# Patient Record
Sex: Female | Born: 1975 | Race: White | Hispanic: No | Smoking: Current some day smoker
Health system: Southern US, Community
[De-identification: ages and names within clinical notes are randomized; demographics above are authoritative.]

## PROBLEM LIST (undated history)

## (undated) DIAGNOSIS — K859 Acute pancreatitis without necrosis or infection, unspecified: Secondary | ICD-10-CM

## (undated) DIAGNOSIS — F32A Depression, unspecified: Secondary | ICD-10-CM

## (undated) DIAGNOSIS — G40909 Epilepsy, unspecified, not intractable, without status epilepticus: Secondary | ICD-10-CM

## (undated) DIAGNOSIS — F319 Bipolar disorder, unspecified: Secondary | ICD-10-CM

## (undated) DIAGNOSIS — F329 Major depressive disorder, single episode, unspecified: Secondary | ICD-10-CM

## (undated) HISTORY — PX: ABDOMINAL HYSTERECTOMY: SHX81

## (undated) HISTORY — PX: PANCREATICODUODENECTOMY: SUR1000

## (undated) HISTORY — PX: DENTAL SURGERY: SHX609

## (undated) HISTORY — PX: CHOLECYSTECTOMY: SHX55

---

## 2009-01-16 ENCOUNTER — Emergency Department (HOSPITAL_COMMUNITY): Admission: EM | Admit: 2009-01-16 | Discharge: 2009-01-16 | Payer: Self-pay | Admitting: Emergency Medicine

## 2009-01-30 ENCOUNTER — Emergency Department (HOSPITAL_COMMUNITY): Admission: EM | Admit: 2009-01-30 | Discharge: 2009-01-30 | Payer: Self-pay | Admitting: Emergency Medicine

## 2010-06-05 ENCOUNTER — Emergency Department (HOSPITAL_COMMUNITY)
Admission: EM | Admit: 2010-06-05 | Discharge: 2010-06-05 | Disposition: A | Discharge status: Home / Self Care | Payer: Self-pay | Attending: Emergency Medicine | Admitting: Emergency Medicine

## 2010-06-05 ENCOUNTER — Emergency Department (HOSPITAL_COMMUNITY): Payer: Self-pay

## 2010-06-05 DIAGNOSIS — R319 Hematuria, unspecified: Secondary | ICD-10-CM | POA: Insufficient documentation

## 2010-06-05 DIAGNOSIS — Z9889 Other specified postprocedural states: Secondary | ICD-10-CM | POA: Insufficient documentation

## 2010-06-05 DIAGNOSIS — Z9089 Acquired absence of other organs: Secondary | ICD-10-CM | POA: Insufficient documentation

## 2010-06-05 DIAGNOSIS — F411 Generalized anxiety disorder: Secondary | ICD-10-CM | POA: Insufficient documentation

## 2010-06-05 DIAGNOSIS — R109 Unspecified abdominal pain: Secondary | ICD-10-CM | POA: Insufficient documentation

## 2010-06-05 LAB — URINALYSIS, ROUTINE W REFLEX MICROSCOPIC
Bilirubin Urine: NEGATIVE
Ketones, ur: NEGATIVE mg/dL
Protein, ur: NEGATIVE mg/dL
Urobilinogen, UA: 0.2 mg/dL (ref 0.0–1.0)

## 2010-06-07 LAB — URINE CULTURE
Colony Count: NO GROWTH
Culture  Setup Time: 201203210119
Culture: NO GROWTH

## 2010-06-20 LAB — URINALYSIS, ROUTINE W REFLEX MICROSCOPIC
Ketones, ur: 15 mg/dL — AB
Ketones, ur: 15 mg/dL — AB
Leukocytes, UA: NEGATIVE
Nitrite: POSITIVE — AB
Protein, ur: NEGATIVE mg/dL
Specific Gravity, Urine: 1.028 (ref 1.005–1.030)
Urobilinogen, UA: 1 mg/dL (ref 0.0–1.0)
Urobilinogen, UA: 1 mg/dL (ref 0.0–1.0)
pH: 5.5 (ref 5.0–8.0)

## 2010-06-20 LAB — CBC
Hemoglobin: 13.9 g/dL (ref 12.0–15.0)
MCHC: 34.4 g/dL (ref 30.0–36.0)
MCV: 85.9 fL (ref 78.0–100.0)
RBC: 4.7 MIL/uL (ref 3.87–5.11)
RDW: 14.5 % (ref 11.5–15.5)

## 2010-06-20 LAB — URINE MICROSCOPIC-ADD ON

## 2010-06-20 LAB — COMPREHENSIVE METABOLIC PANEL
ALT: 26 U/L (ref 0–35)
BUN: 5 mg/dL — ABNORMAL LOW (ref 6–23)
CO2: 27 mEq/L (ref 19–32)
Calcium: 9 mg/dL (ref 8.4–10.5)
Creatinine, Ser: 0.61 mg/dL (ref 0.4–1.2)
GFR calc non Af Amer: >60 mL/min (ref >60)
Glucose, Bld: 120 mg/dL — ABNORMAL HIGH (ref 70–99)
Sodium: 137 mEq/L (ref 135–145)
Total Protein: 6.4 g/dL (ref 6.0–8.3)

## 2010-06-20 LAB — DIFFERENTIAL
Basophils Absolute: 0 10*3/uL (ref 0.0–0.1)
Basophils Relative: 0 % (ref 0–1)
Eosinophils Absolute: 0 10*3/uL (ref 0.0–0.7)
Lymphocytes Relative: 13 % (ref 12–46)
Lymphs Abs: 1.8 10*3/uL (ref 0.7–4.0)
Monocytes Relative: 5 % (ref 3–12)
Neutro Abs: 11.3 10*3/uL — ABNORMAL HIGH (ref 1.7–7.7)
Neutrophils Relative %: 82 % — ABNORMAL HIGH (ref 43–77)

## 2010-06-20 LAB — HIV ANTIBODY (ROUTINE TESTING W REFLEX): HIV: NONREACTIVE

## 2010-06-20 LAB — RPR: RPR Ser Ql: NONREACTIVE

## 2010-11-18 ENCOUNTER — Emergency Department (HOSPITAL_COMMUNITY)
Admission: EM | Admit: 2010-11-18 | Discharge: 2010-11-19 | Disposition: A | Discharge status: Other Acute Inpatient Hospital | Payer: Self-pay | Attending: Emergency Medicine | Admitting: Emergency Medicine

## 2010-11-18 DIAGNOSIS — F411 Generalized anxiety disorder: Secondary | ICD-10-CM | POA: Insufficient documentation

## 2010-11-18 DIAGNOSIS — IMO0002 Reserved for concepts with insufficient information to code with codable children: Secondary | ICD-10-CM | POA: Insufficient documentation

## 2010-11-18 DIAGNOSIS — R45851 Suicidal ideations: Secondary | ICD-10-CM | POA: Insufficient documentation

## 2010-11-18 DIAGNOSIS — S0083XA Contusion of other part of head, initial encounter: Secondary | ICD-10-CM | POA: Insufficient documentation

## 2010-11-18 DIAGNOSIS — S0003XA Contusion of scalp, initial encounter: Secondary | ICD-10-CM | POA: Insufficient documentation

## 2010-11-18 LAB — DIFFERENTIAL
Eosinophils Relative: 3 % (ref 0–5)
Lymphocytes Relative: 43 % (ref 12–46)
Lymphs Abs: 3.2 10*3/uL (ref 0.7–4.0)
Neutro Abs: 3.5 10*3/uL (ref 1.7–7.7)

## 2010-11-18 LAB — COMPREHENSIVE METABOLIC PANEL
ALT: 16 U/L (ref 0–35)
CO2: 25 mEq/L (ref 19–32)
Calcium: 8.3 mg/dL — ABNORMAL LOW (ref 8.4–10.5)
Creatinine, Ser: 0.71 mg/dL (ref 0.50–1.10)
GFR calc Af Amer: >60 mL/min (ref >60)
GFR calc non Af Amer: >60 mL/min (ref >60)
Glucose, Bld: 91 mg/dL (ref 70–99)
Sodium: 141 mEq/L (ref 135–145)
Total Bilirubin: 0.2 mg/dL — ABNORMAL LOW (ref 0.3–1.2)

## 2010-11-18 LAB — CBC
HCT: 41.6 % (ref 36.0–46.0)
Hemoglobin: 14.1 g/dL (ref 12.0–15.0)
MCH: 29.9 pg (ref 26.0–34.0)
MCV: 88.3 fL (ref 78.0–100.0)
RBC: 4.71 MIL/uL (ref 3.87–5.11)
RDW: 13.9 % (ref 11.5–15.5)
WBC: 7.5 10*3/uL (ref 4.0–10.5)

## 2010-11-18 LAB — RAPID URINE DRUG SCREEN, HOSP PERFORMED
Barbiturates: NOT DETECTED
Cocaine: NOT DETECTED
Opiates: NOT DETECTED

## 2010-11-19 ENCOUNTER — Emergency Department (HOSPITAL_COMMUNITY): Payer: Self-pay

## 2010-11-19 ENCOUNTER — Inpatient Hospital Stay (HOSPITAL_COMMUNITY)
Admission: AD | Admit: 2010-11-19 | Discharge: 2010-11-21 | DRG: 881 | Disposition: A | Discharge status: Home / Self Care | Payer: PRIVATE HEALTH INSURANCE | Attending: Psychiatry | Admitting: Psychiatry

## 2010-11-19 DIAGNOSIS — R45851 Suicidal ideations: Secondary | ICD-10-CM

## 2010-11-19 DIAGNOSIS — F1010 Alcohol abuse, uncomplicated: Secondary | ICD-10-CM

## 2010-11-19 DIAGNOSIS — Z6379 Other stressful life events affecting family and household: Secondary | ICD-10-CM

## 2010-11-19 DIAGNOSIS — Z818 Family history of other mental and behavioral disorders: Secondary | ICD-10-CM

## 2010-11-19 DIAGNOSIS — F329 Major depressive disorder, single episode, unspecified: Principal | ICD-10-CM

## 2010-11-19 DIAGNOSIS — F3289 Other specified depressive episodes: Principal | ICD-10-CM

## 2010-11-20 DIAGNOSIS — F319 Bipolar disorder, unspecified: Secondary | ICD-10-CM
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
## 2010-12-17 ENCOUNTER — Emergency Department (HOSPITAL_COMMUNITY): Payer: Self-pay

## 2010-12-17 ENCOUNTER — Emergency Department (HOSPITAL_COMMUNITY)
Admission: EM | Admit: 2010-12-17 | Discharge: 2010-12-17 | Disposition: A | Discharge status: Home / Self Care | Payer: Self-pay | Attending: Emergency Medicine | Admitting: Emergency Medicine

## 2010-12-17 DIAGNOSIS — M549 Dorsalgia, unspecified: Secondary | ICD-10-CM | POA: Insufficient documentation

## 2010-12-17 DIAGNOSIS — R10816 Epigastric abdominal tenderness: Secondary | ICD-10-CM | POA: Insufficient documentation

## 2010-12-17 DIAGNOSIS — K859 Acute pancreatitis without necrosis or infection, unspecified: Secondary | ICD-10-CM | POA: Insufficient documentation

## 2010-12-17 DIAGNOSIS — R1013 Epigastric pain: Secondary | ICD-10-CM | POA: Insufficient documentation

## 2010-12-17 LAB — COMPREHENSIVE METABOLIC PANEL
ALT: 14 U/L (ref 0–35)
AST: 14 U/L (ref 0–37)
CO2: 29 mEq/L (ref 19–32)
Chloride: 98 mEq/L (ref 96–112)
Creatinine, Ser: 0.61 mg/dL (ref 0.50–1.10)
GFR calc Af Amer: >90 mL/min (ref >90)
GFR calc non Af Amer: >90 mL/min (ref >90)
Glucose, Bld: 98 mg/dL (ref 70–99)
Total Bilirubin: 0.4 mg/dL (ref 0.3–1.2)

## 2010-12-17 LAB — URINALYSIS, ROUTINE W REFLEX MICROSCOPIC
Glucose, UA: NEGATIVE mg/dL
Hgb urine dipstick: NEGATIVE
Ketones, ur: NEGATIVE mg/dL
Protein, ur: NEGATIVE mg/dL

## 2010-12-17 LAB — DIFFERENTIAL
Eosinophils Absolute: 0.2 10*3/uL (ref 0.0–0.7)
Lymphocytes Relative: 23 % (ref 12–46)
Lymphs Abs: 2.1 10*3/uL (ref 0.7–4.0)
Monocytes Relative: 7 % (ref 3–12)
Neutro Abs: 6.2 10*3/uL (ref 1.7–7.7)
Neutrophils Relative %: 68 % (ref 43–77)

## 2010-12-17 LAB — CBC
Hemoglobin: 14.8 g/dL (ref 12.0–15.0)
MCV: 89.6 fL (ref 78.0–100.0)
Platelets: 226 10*3/uL (ref 150–400)
RBC: 4.81 MIL/uL (ref 3.87–5.11)
WBC: 9.1 10*3/uL (ref 4.0–10.5)

## 2010-12-17 LAB — LIPASE, BLOOD: Lipase: 2720 U/L — ABNORMAL HIGH (ref 11–59)

## 2011-02-02 ENCOUNTER — Emergency Department (HOSPITAL_COMMUNITY)
Admission: EM | Admit: 2011-02-02 | Discharge: 2011-02-03 | Disposition: A | Discharge status: Home / Self Care | Payer: Self-pay | Attending: Emergency Medicine | Admitting: Emergency Medicine

## 2011-02-02 DIAGNOSIS — F489 Nonpsychotic mental disorder, unspecified: Secondary | ICD-10-CM | POA: Insufficient documentation

## 2011-02-02 DIAGNOSIS — F17200 Nicotine dependence, unspecified, uncomplicated: Secondary | ICD-10-CM | POA: Insufficient documentation

## 2011-02-02 DIAGNOSIS — IMO0002 Reserved for concepts with insufficient information to code with codable children: Secondary | ICD-10-CM | POA: Insufficient documentation

## 2011-02-02 DIAGNOSIS — K2920 Alcoholic gastritis without bleeding: Secondary | ICD-10-CM | POA: Insufficient documentation

## 2011-02-02 DIAGNOSIS — X789XXA Intentional self-harm by unspecified sharp object, initial encounter: Secondary | ICD-10-CM | POA: Insufficient documentation

## 2011-02-02 HISTORY — DX: Bipolar disorder, unspecified: F31.9

## 2011-02-02 LAB — BASIC METABOLIC PANEL
BUN: 14 mg/dL (ref 6–23)
GFR calc non Af Amer: >90 mL/min (ref >90)
Glucose, Bld: 84 mg/dL (ref 70–99)
Potassium: 3.4 mEq/L — ABNORMAL LOW (ref 3.5–5.1)

## 2011-02-02 LAB — CBC
HCT: 44.2 % (ref 36.0–46.0)
Hemoglobin: 14.9 g/dL (ref 12.0–15.0)
MCH: 30.5 pg (ref 26.0–34.0)
MCV: 90.6 fL (ref 78.0–100.0)
RBC: 4.88 MIL/uL (ref 3.87–5.11)

## 2011-02-02 LAB — DIFFERENTIAL
Eosinophils Absolute: 0.2 10*3/uL (ref 0.0–0.7)
Eosinophils Relative: 1 % (ref 0–5)
Lymphs Abs: 3.4 10*3/uL (ref 0.7–4.0)
Monocytes Relative: 6 % (ref 3–12)
Neutrophils Relative %: 61 % (ref 43–77)

## 2011-02-02 LAB — ETHANOL: Alcohol, Ethyl (B): 240 mg/dL — ABNORMAL HIGH (ref 0–11)
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
## 2011-02-02 NOTE — ED Notes (Signed)
Attempted to call report to psych ED. RN not available

## 2011-02-03 LAB — LIPASE, BLOOD: Lipase: 8 U/L — ABNORMAL LOW (ref 11–59)

## 2011-02-03 LAB — HEPATIC FUNCTION PANEL
Bilirubin, Direct: <0.1 mg/dL (ref 0.0–0.3)
Total Protein: 6.7 g/dL (ref 6.0–8.3)

## 2011-02-03 LAB — ETHANOL: Alcohol, Ethyl (B): <11 mg/dL (ref 0–11)

## 2011-02-03 LAB — RAPID URINE DRUG SCREEN, HOSP PERFORMED
Benzodiazepines: NOT DETECTED
Cocaine: NOT DETECTED

## 2011-02-03 MED ORDER — GI COCKTAIL ~~LOC~~
30.0000 mL | Freq: Once | ORAL | Status: AC
  Administered 2011-02-03: 30 mL via ORAL
  Filled 2011-02-03: qty 30

## 2011-02-03 MED ORDER — RANITIDINE HCL 150 MG PO TABS: 150.0000 mg | ORAL_TABLET | Freq: Two times a day (BID) | ORAL | Status: DC

## 2011-02-03 MED ORDER — HYDROMORPHONE HCL PF 2 MG/ML IJ SOLN
INTRAMUSCULAR | Status: AC
  Administered 2011-02-03: 1 mg
  Filled 2011-02-03: qty 1

## 2011-02-03 MED ORDER — SODIUM CHLORIDE 0.9 % IV BOLUS (SEPSIS)
1000.0000 mL | Freq: Once | INTRAVENOUS | Status: AC
  Administered 2011-02-03: 1000 mL via INTRAVENOUS

## 2011-02-03 MED ORDER — FAMOTIDINE IN NACL 20-0.9 MG/50ML-% IV SOLN
20.0000 mg | Freq: Once | INTRAVENOUS | Status: AC
  Administered 2011-02-03: 20 mg via INTRAVENOUS
  Filled 2011-02-03: qty 50

## 2011-02-03 MED ORDER — HYDROMORPHONE HCL PF 1 MG/ML IJ SOLN: 1.0000 mg | Freq: Once | INTRAMUSCULAR | Status: DC

## 2011-02-03 MED ORDER — ONDANSETRON HCL 4 MG/2ML IJ SOLN
4.0000 mg | Freq: Once | INTRAMUSCULAR | Status: AC
  Administered 2011-02-03: 4 mg via INTRAVENOUS
  Filled 2011-02-03: qty 2

## 2011-02-03 NOTE — ED Notes (Signed)
Patient is alert and oriented x3.  She has been given DC instructions and follow up instructions Patient gave verbal understanding. V/S stable.  Patient leaves via bus.  Bus pass given Patient was not showing any signs of distress on DC
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
## 2011-02-03 NOTE — ED Notes (Signed)
Dr Rica Koyanagi updated w/ pt's complaint of pain/vomiting-move pt to acute care for further treatment.   Patty charge nurse updated-move pt to room 17

## 2011-02-03 NOTE — ED Notes (Signed)
Pt moved to room 18, report to Toffer RN-1 bag of belongings and chart sent w/ pt and given to [employer]
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
## 2011-02-03 NOTE — ED Notes (Signed)
Dr. Anitra Lauth gave orders to release psych restrictions on pt. Pt is no longer under psych. All psych issues have been resolved. Dr. Anitra Lauth gave orders to feed pt and proceed with discharge home.
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
## 2011-02-03 NOTE — ED Notes (Signed)
Marcus with ACT team in to speak with pt at this time.
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
## 2011-02-03 NOTE — ED Notes (Signed)
telepsych in progress 

## 2011-02-04 NOTE — ED Provider Notes (Signed)
Medical screening examination/treatment/procedure(s) were performed by non-physician practitioner and as supervising physician I was immediately available for consultation/collaboration.   Gerhard Munch, MD 02/04/11 [phone]

## 2012-03-25 ENCOUNTER — Encounter (HOSPITAL_COMMUNITY): Payer: Self-pay | Admitting: *Deleted

## 2012-03-25 ENCOUNTER — Emergency Department (HOSPITAL_COMMUNITY): Payer: Self-pay

## 2012-03-25 ENCOUNTER — Other Ambulatory Visit: Payer: Self-pay

## 2012-03-25 ENCOUNTER — Emergency Department (HOSPITAL_COMMUNITY)
Admission: EM | Admit: 2012-03-25 | Discharge: 2012-03-25 | Disposition: A | Discharge status: Home / Self Care | Payer: Self-pay | Attending: Emergency Medicine | Admitting: Emergency Medicine

## 2012-03-25 DIAGNOSIS — Z8719 Personal history of other diseases of the digestive system: Secondary | ICD-10-CM | POA: Insufficient documentation

## 2012-03-25 DIAGNOSIS — Z79899 Other long term (current) drug therapy: Secondary | ICD-10-CM | POA: Insufficient documentation

## 2012-03-25 DIAGNOSIS — N12 Tubulo-interstitial nephritis, not specified as acute or chronic: Secondary | ICD-10-CM

## 2012-03-25 DIAGNOSIS — F17200 Nicotine dependence, unspecified, uncomplicated: Secondary | ICD-10-CM | POA: Insufficient documentation

## 2012-03-25 DIAGNOSIS — R109 Unspecified abdominal pain: Secondary | ICD-10-CM | POA: Insufficient documentation

## 2012-03-25 DIAGNOSIS — R079 Chest pain, unspecified: Secondary | ICD-10-CM | POA: Insufficient documentation

## 2012-03-25 DIAGNOSIS — Z3202 Encounter for pregnancy test, result negative: Secondary | ICD-10-CM | POA: Insufficient documentation

## 2012-03-25 DIAGNOSIS — F319 Bipolar disorder, unspecified: Secondary | ICD-10-CM | POA: Insufficient documentation

## 2012-03-25 LAB — CBC WITH DIFFERENTIAL/PLATELET
Basophils Absolute: 0 10*3/uL (ref 0.0–0.1)
Basophils Relative: 0 % (ref 0–1)
Eosinophils Relative: 1 % (ref 0–5)
HCT: 40.2 % (ref 36.0–46.0)
Hemoglobin: 13.8 g/dL (ref 12.0–15.0)
Lymphocytes Relative: 11 % — ABNORMAL LOW (ref 12–46)
Lymphs Abs: 1.4 10*3/uL (ref 0.7–4.0)
MCHC: 34.3 g/dL (ref 30.0–36.0)
Monocytes Absolute: 0.7 10*3/uL (ref 0.1–1.0)
Monocytes Relative: 5 % (ref 3–12)
Neutro Abs: 10.4 10*3/uL — ABNORMAL HIGH (ref 1.7–7.7)
Neutrophils Relative %: 83 % — ABNORMAL HIGH (ref 43–77)
RBC: 4.54 MIL/uL (ref 3.87–5.11)
WBC: 12.6 10*3/uL — ABNORMAL HIGH (ref 4.0–10.5)

## 2012-03-25 LAB — URINALYSIS, ROUTINE W REFLEX MICROSCOPIC
Glucose, UA: NEGATIVE mg/dL
Ketones, ur: NEGATIVE mg/dL
Protein, ur: 30 mg/dL — AB
Urobilinogen, UA: 0.2 mg/dL (ref 0.0–1.0)

## 2012-03-25 LAB — URINE MICROSCOPIC-ADD ON

## 2012-03-25 LAB — COMPREHENSIVE METABOLIC PANEL
AST: 15 U/L (ref 0–37)
Albumin: 3.7 g/dL (ref 3.5–5.2)
Alkaline Phosphatase: 88 U/L (ref 39–117)
BUN: 10 mg/dL (ref 6–23)
CO2: 29 mEq/L (ref 19–32)
Chloride: 96 mEq/L (ref 96–112)
GFR calc non Af Amer: >90 mL/min (ref >90)
Potassium: 3.6 mEq/L (ref 3.5–5.1)
Total Bilirubin: 0.4 mg/dL (ref 0.3–1.2)

## 2012-03-25 MED ORDER — HYDROMORPHONE HCL PF 1 MG/ML IJ SOLN
1.0000 mg | Freq: Once | INTRAMUSCULAR | Status: AC
  Administered 2012-03-25: 1 mg via INTRAVENOUS
  Filled 2012-03-25: qty 1

## 2012-03-25 MED ORDER — HYDROCODONE-ACETAMINOPHEN 5-325 MG PO TABS: 1.0000 | ORAL_TABLET | Freq: Four times a day (QID) | ORAL | Status: DC | PRN

## 2012-03-25 MED ORDER — NAPROXEN 500 MG PO TABS: 500.0000 mg | ORAL_TABLET | Freq: Two times a day (BID) | ORAL | Status: DC

## 2012-03-25 MED ORDER — CEFTRIAXONE SODIUM 1 G IJ SOLR
1.0000 g | INTRAMUSCULAR | Status: DC
  Administered 2012-03-25: 1 g via INTRAVENOUS
  Filled 2012-03-25: qty 10

## 2012-03-25 MED ORDER — SODIUM CHLORIDE 0.9 % IV BOLUS (SEPSIS)
1000.0000 mL | Freq: Once | INTRAVENOUS | Status: AC
  Administered 2012-03-25: 1000 mL via INTRAVENOUS

## 2012-03-25 MED ORDER — CIPROFLOXACIN HCL 500 MG PO TABS: 500.0000 mg | ORAL_TABLET | Freq: Two times a day (BID) | ORAL | Status: DC

## 2012-03-25 NOTE — ED Provider Notes (Signed)
MSE was initiated and I personally evaluated the patient and placed orders including UA, CBC, CMP, lumbar spine xray, EKG, CXR, dilaudid at  4:30 PM on March 25, 2012.  37 year old female presenting with low-back pain shooting up and down her spine radiating around to both flanks. She thought she was coming down with the flu. Admits to pelvic pressure when urinating. Also complaining of chest pain which she has never had before and made her really nervous. She felt as if her heart was pounding out of her chest. No history of kidney stones. On PE, patient is well developed, well-nourished and appears anxious. She is tachycardic with normal heart sounds. Abdomen soft with normal bowel sounds. Marked tenderness suprapubic area with guarding. Generalized tenderness lumbar spine and paraspinal muscles. AAO x3.  The patient appears stable so that the remainder of the MSE may be completed by another provider.  Trevor Mace, PA-C 03/25/12 1632  Trevor Mace, PA-C 03/25/12 1640

## 2012-03-25 NOTE — ED Notes (Signed)
Pt states she has had dilaudid before with no problems.

## 2012-03-25 NOTE — ED Notes (Signed)
Pt c/o lower back that radiates to bilateral flank and reports generalized aches. Also reports "last night I felt like my heart was going to pound out of my chest." denies chest pain at present.

## 2012-03-25 NOTE — ED Provider Notes (Signed)
History     CSN: [id number] Arrival date & time 03/25/12  1617 First MD Initiated Contact with Patient 03/25/12 1626      Chief Complaint  Patient presents with  . Back Pain  . Chest Pain    Patient is a 37 y.o. female presenting with back pain and chest pain.  Back Pain  Associated symptoms include chest pain.  Chest Pain    Patient presents to emergency room with complaints of lower back pain radiating to her bilateral flank area. This started a couple days ago. Associated with the symptoms she has had some discomfort with urination and has noticed a cloudy discoloration to her urine. Last night she also had an episode of sharp pain in her chest and felt like her heart was going to come out of her chest.  Symptoms were severe last night. Patient denies any symptoms with her chest currently. She denies any vomiting or diarrhea. She has not had any fevers.  She denies shortness of breath. No history of blood clot or DVT. No recent trips or travel. No family history of coronary artery disease.  Past Medical History  Diagnosis Date  . Bipolar 1 disorder    pancreatitis  Past Surgical History  Procedure Date  . Pancreaticoduodenectomy   . Cholecystectomy   . Abdominal hysterectomy     partial   . Dental surgery     History reviewed. No pertinent family history.  History  Substance Use Topics  . Smoking status: Current Every Day Smoker -- 1.0 packs/day for 15 years    Types: Cigarettes  . Smokeless tobacco: Not on file  . Alcohol Use: Yes     Comment: 5th of vodka in two days    OB History    Grav Para Term Preterm Abortions TAB SAB Ect Mult Living                  Review of Systems  Cardiovascular: Positive for chest pain.  Musculoskeletal: Positive for back pain.  All other systems reviewed and are negative.    Allergies  Morphine and related and Demerol  Home Medications   Current Outpatient Rx  Name  Route  Sig  Dispense  Refill  . DESVENLAFAXINE  SUCCINATE ER 50 MG PO TB24   Oral   Take 50 mg by mouth daily.         Marland Kitchen HYDROXYZINE HCL 50 MG PO TABS   Oral   Take 50-100 mg by mouth 3 (three) times daily as needed. Anxiety         . RANITIDINE HCL 150 MG PO TABS   Oral   Take 1 tablet (150 mg total) by mouth 2 (two) times daily.   30 tablet   0     BP 172/97  Pulse 112  Temp 99.3 F (37.4 C) (Oral)  Resp 18  SpO2 99%  Physical Exam  Nursing note and vitals reviewed. Constitutional: She appears well-developed and well-nourished. No distress.  HENT:  Head: Normocephalic and atraumatic.  Right Ear: External ear normal.  Left Ear: External ear normal.  Eyes: Conjunctivae normal are normal. Right eye exhibits no discharge. Left eye exhibits no discharge. No scleral icterus.  Neck: Neck supple. No tracheal deviation present.  Cardiovascular: Normal rate, regular rhythm and intact distal pulses.   Pulmonary/Chest: Effort normal and breath sounds normal. No stridor. No respiratory distress. She has no wheezes. She has no rales.  Abdominal: Soft. Bowel sounds are normal. She exhibits no  distension. There is tenderness (mild suprapubic region). There is no rebound and no guarding.       CVA tenderness bilaterally  Musculoskeletal: She exhibits no edema and no tenderness.  Neurological: She is alert. She has normal strength. No sensory deficit. Cranial nerve deficit:  no gross defecits noted. She exhibits normal muscle tone. She displays no seizure activity. Coordination normal.  Skin: Skin is warm and dry. No rash noted.  Psychiatric: She has a normal mood and affect.    ED Course  Procedures (including critical care time)  Medications  hydrOXYzine (ATARAX/VISTARIL) 50 MG tablet (not administered)  desvenlafaxine (PRISTIQ) 50 MG 24 hr tablet (not administered)  cefTRIAXone (ROCEPHIN) 1 g in dextrose 5 % 50 mL IVPB (0 g Intravenous Stopped 03/25/12 2025)  ciprofloxacin (CIPRO) 500 MG tablet (not administered)  naproxen  (NAPROSYN) 500 MG tablet (not administered)  HYDROcodone-acetaminophen (NORCO) 5-325 MG per tablet (not administered)  sodium chloride 0.9 % bolus 1,000 mL (0 mL Intravenous Stopped 03/25/12 1850)  HYDROmorphone (DILAUDID) injection 1 mg (1 mg Intravenous Given 03/25/12 1732)  HYDROmorphone (DILAUDID) injection 1 mg (1 mg Intravenous Given 03/25/12 1940)   EKG Rate 110 sinus tachycardia Normal ST-T waves Rate faster since last tracing otherwise no significant changes  Labs Reviewed  CBC WITH DIFFERENTIAL - Abnormal; Notable for the following:    WBC 12.6 (*)     Neutrophils Relative 83 (*)     Neutro Abs 10.4 (*)     Lymphocytes Relative 11 (*)     All other components within normal limits  COMPREHENSIVE METABOLIC PANEL - Abnormal; Notable for the following:    Glucose, Bld 178 (*)     All other components within normal limits  URINALYSIS, ROUTINE W REFLEX MICROSCOPIC - Abnormal; Notable for the following:    APPearance CLOUDY (*)     Hgb urine dipstick SMALL (*)     Protein, ur 30 (*)     Nitrite POSITIVE (*)     Leukocytes, UA LARGE (*)     All other components within normal limits  URINE MICROSCOPIC-ADD ON - Abnormal; Notable for the following:    Squamous Epithelial / LPF FEW (*)     Bacteria, UA MANY (*)     All other components within normal limits  PREGNANCY, URINE  LIPASE, BLOOD  URINE CULTURE   Dg Chest 2 View  03/25/2012  *RADIOLOGY REPORT*  Clinical Data: Chest pain.  CHEST - 2 VIEW  Comparison: None.  Findings: Heart and mediastinal contours are within normal limits. No focal opacities or effusions.  No acute bony abnormality.  IMPRESSION: No active cardiopulmonary disease.   Original Report Authenticated By: Charlett Nose, M.D.    Dg Lumbar Spine Complete  03/25/2012  *RADIOLOGY REPORT*  Clinical Data: Low back pain.  LUMBAR SPINE - COMPLETE 4+ VIEW  Comparison: CT 06/05/2010  Findings: Normal alignment.  Five lumbar-type vertebral bodies. Disc spaces are maintained.  No  fracture.  Sclerosis around the left SI joint suggesting sacroiliitis.  Right SI joint unremarkable.  IMPRESSION: Question left sacroiliitis.  No acute bony abnormality.   Original Report Authenticated By: Charlett Nose, M.D.      1. Pyelonephritis       MDM  The patient has evidence of a urinary tract infection.  With her leukocytosis and flank pain suspect she has a component of pyelonephritis. Patient discharged home on prescription for ciprofloxacin as well as hydrocodone for pain. She's encouraged to follow up with primary care Dr. to  make sure the infection resolves        Celene Kras, MD 03/25/12 2043

## 2012-03-27 LAB — URINE CULTURE

## 2012-03-28 NOTE — ED Notes (Signed)
+  Urine. Patient treated with Cipro. Sensitive to same. Per protocol MD. °

## 2012-04-11 ENCOUNTER — Emergency Department (HOSPITAL_COMMUNITY): Payer: Self-pay

## 2012-04-11 ENCOUNTER — Emergency Department (HOSPITAL_COMMUNITY)
Admission: EM | Admit: 2012-04-11 | Discharge: 2012-04-12 | Disposition: A | Discharge status: Home / Self Care | Payer: Self-pay | Attending: Emergency Medicine | Admitting: Emergency Medicine

## 2012-04-11 ENCOUNTER — Encounter (HOSPITAL_COMMUNITY): Payer: Self-pay | Admitting: *Deleted

## 2012-04-11 DIAGNOSIS — Z792 Long term (current) use of antibiotics: Secondary | ICD-10-CM | POA: Insufficient documentation

## 2012-04-11 DIAGNOSIS — F309 Manic episode, unspecified: Secondary | ICD-10-CM | POA: Insufficient documentation

## 2012-04-11 DIAGNOSIS — Z8719 Personal history of other diseases of the digestive system: Secondary | ICD-10-CM | POA: Insufficient documentation

## 2012-04-11 DIAGNOSIS — R195 Other fecal abnormalities: Secondary | ICD-10-CM | POA: Insufficient documentation

## 2012-04-11 DIAGNOSIS — Z79899 Other long term (current) drug therapy: Secondary | ICD-10-CM | POA: Insufficient documentation

## 2012-04-11 DIAGNOSIS — Z9089 Acquired absence of other organs: Secondary | ICD-10-CM | POA: Insufficient documentation

## 2012-04-11 DIAGNOSIS — R1013 Epigastric pain: Secondary | ICD-10-CM | POA: Insufficient documentation

## 2012-04-11 DIAGNOSIS — R110 Nausea: Secondary | ICD-10-CM | POA: Insufficient documentation

## 2012-04-11 DIAGNOSIS — Z3202 Encounter for pregnancy test, result negative: Secondary | ICD-10-CM | POA: Insufficient documentation

## 2012-04-11 DIAGNOSIS — F17200 Nicotine dependence, unspecified, uncomplicated: Secondary | ICD-10-CM | POA: Insufficient documentation

## 2012-04-11 LAB — COMPREHENSIVE METABOLIC PANEL
ALT: 13 U/L (ref 0–35)
AST: 16 U/L (ref 0–37)
Alkaline Phosphatase: 74 U/L (ref 39–117)
CO2: 25 mEq/L (ref 19–32)
Chloride: 101 mEq/L (ref 96–112)
Creatinine, Ser: 0.57 mg/dL (ref 0.50–1.10)
GFR calc non Af Amer: >90 mL/min (ref >90)
Sodium: 136 mEq/L (ref 135–145)
Total Bilirubin: 0.2 mg/dL — ABNORMAL LOW (ref 0.3–1.2)

## 2012-04-11 LAB — CBC WITH DIFFERENTIAL/PLATELET
Basophils Absolute: 0 10*3/uL (ref 0.0–0.1)
HCT: 40 % (ref 36.0–46.0)
Lymphocytes Relative: 27 % (ref 12–46)
Monocytes Absolute: 0.5 10*3/uL (ref 0.1–1.0)
Neutro Abs: 4.9 10*3/uL (ref 1.7–7.7)
RDW: 14.5 % (ref 11.5–15.5)
WBC: 7.7 10*3/uL (ref 4.0–10.5)

## 2012-04-11 LAB — URINALYSIS, MICROSCOPIC ONLY
Bilirubin Urine: NEGATIVE
Hgb urine dipstick: NEGATIVE
Ketones, ur: NEGATIVE mg/dL
Protein, ur: NEGATIVE mg/dL
Urobilinogen, UA: 0.2 mg/dL (ref 0.0–1.0)

## 2012-04-11 MED ORDER — SODIUM CHLORIDE 0.9 % IV SOLN
INTRAVENOUS | Status: DC
  Administered 2012-04-12: via INTRAVENOUS

## 2012-04-11 MED ORDER — ONDANSETRON HCL 4 MG/2ML IJ SOLN
4.0000 mg | Freq: Once | INTRAMUSCULAR | Status: AC
  Administered 2012-04-12: 4 mg via INTRAVENOUS
  Filled 2012-04-11: qty 2

## 2012-04-11 MED ORDER — IOHEXOL 300 MG/ML  SOLN: 50.0000 mL | Freq: Once | INTRAMUSCULAR | Status: AC | PRN

## 2012-04-11 MED ORDER — HYDROMORPHONE HCL PF 1 MG/ML IJ SOLN
1.0000 mg | Freq: Once | INTRAMUSCULAR | Status: AC
  Administered 2012-04-12: 1 mg via INTRAVENOUS
  Filled 2012-04-11: qty 1

## 2012-04-11 NOTE — ED Provider Notes (Signed)
History     CSN: [id number]  Arrival date & time 04/11/12  2134   First MD Initiated Contact with Patient 04/11/12 2304      Chief Complaint  Patient presents with  . Abdominal Pain    (Consider location/radiation/quality/duration/timing/severity/associated sxs/prior treatment) HPI Hx per PT, Epigastric pain since yesterday feels like gas, pain has been constant and worsening, h/o pancreatitis with Whipple performed at [facility] in 2009. Has not had any sig issues since that time. Pain is now sharp and radiates to her back. Last BM today, mucous no blood, has nausea no vomiting, no fevers, no sick contacts, had GB removed 2009. Mod to severe pain. Currently taking Cipro for UTI, no LBP/ flank pain. No dysuria.   Past Medical History  Diagnosis Date  . Bipolar 1 disorder     Past Surgical History  Procedure Date  . Pancreaticoduodenectomy   . Cholecystectomy   . Abdominal hysterectomy     partial   . Dental surgery     No family history on file.  History  Substance Use Topics  . Smoking status: Current Every Day Smoker -- 1.0 packs/day for 15 years    Types: Cigarettes  . Smokeless tobacco: Not on file  . Alcohol Use: Yes     Comment: 5th of vodka in two days    OB History    Grav Para Term Preterm Abortions TAB SAB Ect Mult Living                  Review of Systems  Constitutional: Negative for fever and chills.  HENT: Negative for neck pain and neck stiffness.   Eyes: Negative for pain.  Respiratory: Negative for shortness of breath.   Cardiovascular: Negative for chest pain.  Gastrointestinal: Positive for nausea and abdominal pain. Negative for vomiting and blood in stool.  Genitourinary: Negative for dysuria.  Musculoskeletal: Negative for back pain.  Skin: Negative for rash.  Neurological: Negative for headaches.  All other systems reviewed and are negative.    Allergies  Demerol and Morphine and related  Home Medications   Current Outpatient Rx    Name  Route  Sig  Dispense  Refill  . CIPROFLOXACIN HCL 500 MG PO TABS   Oral   Take 500 mg by mouth 2 (two) times daily.         . DESVENLAFAXINE SUCCINATE ER 50 MG PO TB24   Oral   Take 50 mg by mouth every morning.          Marland Kitchen HYDROCODONE-ACETAMINOPHEN 5-325 MG PO TABS   Oral   Take 1-2 tablets by mouth every 6 (six) hours as needed. For pain         . HYDROXYZINE HCL 50 MG PO TABS   Oral   Take 50-100 mg by mouth 3 (three) times daily as needed. Anxiety         . NAPROXEN 500 MG PO TABS   Oral   Take 1 tablet (500 mg total) by mouth 2 (two) times daily.   30 tablet   0   . TRAMADOL HCL 50 MG PO TABS   Oral   Take 50 mg by mouth every 6 (six) hours as needed. For pain         . RANITIDINE HCL 150 MG PO TABS   Oral   Take 1 tablet (150 mg total) by mouth 2 (two) times daily.   30 tablet   0     BP 140/85  Pulse 68  Temp 98.4 F (36.9 C) (Oral)  Resp 20  SpO2 100%  Physical Exam  Constitutional: She is oriented to person, place, and time. She appears well-developed and well-nourished.  HENT:  Head: Normocephalic and atraumatic.  Eyes: EOM are normal. Pupils are equal, round, and reactive to light. No scleral icterus.  Neck: Neck supple.  Cardiovascular: Normal rate, regular rhythm and intact distal pulses.   Pulmonary/Chest: Effort normal and breath sounds normal. No respiratory distress. She exhibits no tenderness.  Abdominal: Soft. Bowel sounds are normal. She exhibits no distension. There is no rebound and no guarding.       TTP epigastric with some vol guarding  Musculoskeletal: Normal range of motion. She exhibits no edema.  Neurological: She is alert and oriented to person, place, and time.  Skin: Skin is warm and dry.    ED Course  Procedures (including critical care time)  Results for orders placed during the hospital encounter of 04/11/12  CBC WITH DIFFERENTIAL      Component Value Range   WBC 7.7  4.0 - 10.5 K/uL   RBC 4.39  3.87 -  5.11 MIL/uL   Hemoglobin 13.4  12.0 - 15.0 g/dL   HCT 46.9  62.9 - 52.8 %   MCV 91.1  78.0 - 100.0 fL   MCH 30.5  26.0 - 34.0 pg   MCHC 33.5  30.0 - 36.0 g/dL   RDW 41.3  24.4 - 01.0 %   Platelets 234  150 - 400 K/uL   Neutrophils Relative 63  43 - 77 %   Neutro Abs 4.9  1.7 - 7.7 K/uL   Lymphocytes Relative 27  12 - 46 %   Lymphs Abs 2.1  0.7 - 4.0 K/uL   Monocytes Relative 7  3 - 12 %   Monocytes Absolute 0.5  0.1 - 1.0 K/uL   Eosinophils Relative 2  0 - 5 %   Eosinophils Absolute 0.2  0.0 - 0.7 K/uL   Basophils Relative 0  0 - 1 %   Basophils Absolute 0.0  0.0 - 0.1 K/uL  COMPREHENSIVE METABOLIC PANEL      Component Value Range   Sodium 136  135 - 145 mEq/L   Potassium 4.1  3.5 - 5.1 mEq/L   Chloride 101  96 - 112 mEq/L   CO2 25  19 - 32 mEq/L   Glucose, Bld 106 (*) 70 - 99 mg/dL   BUN 10  6 - 23 mg/dL   Creatinine, Ser 2.72  0.50 - 1.10 mg/dL   Calcium 9.3  8.4 - 53.6 mg/dL   Total Protein 7.1  6.0 - 8.3 g/dL   Albumin 3.9  3.5 - 5.2 g/dL   AST 16  0 - 37 U/L   ALT 13  0 - 35 U/L   Alkaline Phosphatase 74  39 - 117 U/L   Total Bilirubin 0.2 (*) 0.3 - 1.2 mg/dL   GFR calc non Af Amer >90  >90 mL/min   GFR calc Af Amer >90  >90 mL/min  URINALYSIS, MICROSCOPIC ONLY      Component Value Range   Color, Urine YELLOW  YELLOW   APPearance CLEAR  CLEAR   Specific Gravity, Urine 1.022  1.005 - 1.030   pH 6.0  5.0 - 8.0   Glucose, UA NEGATIVE  NEGATIVE mg/dL   Hgb urine dipstick NEGATIVE  NEGATIVE   Bilirubin Urine NEGATIVE  NEGATIVE   Ketones, ur NEGATIVE  NEGATIVE mg/dL  Protein, ur NEGATIVE  NEGATIVE mg/dL   Urobilinogen, UA 0.2  0.0 - 1.0 mg/dL   Nitrite NEGATIVE  NEGATIVE   Leukocytes, UA TRACE (*) NEGATIVE   WBC, UA 0-2  <3 WBC/hpf   Squamous Epithelial / LPF RARE  RARE  PREGNANCY, URINE      Component Value Range   Preg Test, Ur NEGATIVE  NEGATIVE  LIPASE, BLOOD      Component Value Range   Lipase 29  11 - 59 U/L     Ct Abdomen Pelvis W  Contrast  04/12/2012  *RADIOLOGY REPORT*  Clinical Data: Upper abdominal pain, nausea, vomiting and diarrhea. Abdominal cramping.  CT ABDOMEN AND PELVIS WITH CONTRAST  Technique:  Multidetector CT imaging of the abdomen and pelvis was performed following the standard protocol during bolus administration of intravenous contrast.  Contrast: OMNIPAQUE IOHEXOL 300 MG/ML  SOLN  Comparison: CT of the abdomen and pelvis performed 06/05/2010  Findings: The visualized lung bases are clear.  The liver and spleen are unremarkable in appearance.  The patient is status post cholecystectomy, with clips noted at the gallbladder fossa.  The patient is status post Whipple procedure; the associated bowel anastomosis is grossly unremarkable, without evidence obstruction.  Scattered calcification and heterogeneity is noted along the remaining pancreatic tail, reflecting sequelae of chronic pancreatitis.  The adrenal glands are within normal limits.  The kidneys are unremarkable in appearance.  There is no evidence of hydronephrosis.  No renal or ureteral stones are seen.  No perinephric stranding is appreciated.  No free fluid is identified.  The small bowel is unremarkable in appearance.  The stomach is within normal limits.  No acute vascular abnormalities are seen.  The appendix is relatively short and normal in caliber, without evidence for appendicitis.  The colon is partially filled with stool and is unremarkable in appearance.  The bladder is mildly distended and grossly unremarkable.  The patient is status post hysterectomy; there is slight prominence of the vaginal cuff.  A 3.8 cm left adnexal cystic lesion is noted. The right ovary is unremarkable in appearance.  No inguinal lymphadenopathy is seen.  No acute osseous abnormalities are identified.  There is mild chronic loss of height involving vertebral body T11, stable from 2012.  IMPRESSION:  1.  No acute abnormality seen within the abdomen or pelvis.  Status post  Whipple procedure. 2.  Scattered calcification and heterogeneity along the remaining pancreatic tail, reflecting sequelae of chronic pancreatitis.  3.  3.8 cm left adnexal cystic lesion noted.  Given the patient's age, this is more likely a simple cyst, though pelvic ultrasound is recommended for further evaluation, when and as deemed clinically appropriate.   Original Report Authenticated By: Tonia Ghent, M.D.    IV Dilaudid. IVFS. IV zofran  Pain improved but returned, GI cocktail, pepcid and protonix given.  Plan RX pepcid and Gi referral. precautions provided.   MDM  ABD pain possible GI origin - no acute findings on CT as above. Labs and UA reviewed. VS and nursing notes reviewed.         Sunnie Nielsen, MD 04/12/12 [phone]

## 2012-04-11 NOTE — ED Notes (Signed)
Pt c/o abdominal cramping that started yesterday. Started having n/v/d today.

## 2012-04-12 MED ORDER — IOHEXOL 300 MG/ML  SOLN
50.0000 mL | Freq: Once | INTRAMUSCULAR | Status: AC | PRN
  Administered 2012-04-12: 50 mL via ORAL

## 2012-04-12 MED ORDER — FAMOTIDINE 20 MG PO TABS
20.0000 mg | ORAL_TABLET | Freq: Once | ORAL | Status: AC
  Administered 2012-04-12: 20 mg via ORAL
  Filled 2012-04-12: qty 1

## 2012-04-12 MED ORDER — PANTOPRAZOLE SODIUM 40 MG IV SOLR
40.0000 mg | Freq: Once | INTRAVENOUS | Status: AC
  Administered 2012-04-12: 40 mg via INTRAVENOUS
  Filled 2012-04-12: qty 40

## 2012-04-12 MED ORDER — FAMOTIDINE 20 MG PO TABS: 20.0000 mg | ORAL_TABLET | Freq: Two times a day (BID) | ORAL | Status: DC

## 2012-04-12 MED ORDER — ONDANSETRON HCL 4 MG/2ML IJ SOLN
4.0000 mg | Freq: Once | INTRAMUSCULAR | Status: AC
  Administered 2012-04-12: 4 mg via INTRAVENOUS
  Filled 2012-04-12: qty 2

## 2012-04-12 MED ORDER — PANTOPRAZOLE SODIUM 20 MG PO TBEC: 20.0000 mg | DELAYED_RELEASE_TABLET | Freq: Every day | ORAL | Status: DC

## 2012-04-12 MED ORDER — GI COCKTAIL ~~LOC~~
30.0000 mL | Freq: Once | ORAL | Status: AC
  Administered 2012-04-12: 30 mL via ORAL
  Filled 2012-04-12: qty 30

## 2012-04-12 MED ORDER — IOHEXOL 300 MG/ML  SOLN
100.0000 mL | Freq: Once | INTRAMUSCULAR | Status: AC | PRN
  Administered 2012-04-12: 100 mL via INTRAVENOUS

## 2012-04-12 MED ORDER — HYDROCODONE-ACETAMINOPHEN 7.5-325 MG/15ML PO SOLN: ORAL | Status: DC

## 2012-04-12 MED ORDER — HYDROMORPHONE HCL PF 1 MG/ML IJ SOLN
1.0000 mg | Freq: Once | INTRAMUSCULAR | Status: AC
  Administered 2012-04-12: 1 mg via INTRAVENOUS
  Filled 2012-04-12: qty 1

## 2013-10-10 IMAGING — CT CT ABD-PELV W/ CM
1 of 2 series · 14 of 32 positions shown, 18 images · IV contrast (OMNIPAQUE 300)
Comparison: CT of the abdomen and pelvis performed 06/05/2010

CLINICAL DATA: Upper abdominal pain, nausea, vomiting and diarrhea.
Abdominal cramping.

CT ABDOMEN AND PELVIS WITH CONTRAST
TECHNIQUE: Multidetector CT imaging of the abdomen and pelvis was
performed following the standard protocol during bolus
administration of intravenous contrast.
Contrast: 100mL OMNIPAQUE IOHEXOL 300 MG/ML  SOLN

[Series 2: abd/pel with · axial · 0.72mm/px · z∈[-465,-45]mm · 14 of 92 slices shown, 18 images]
[im 4/92  soft-tissue]
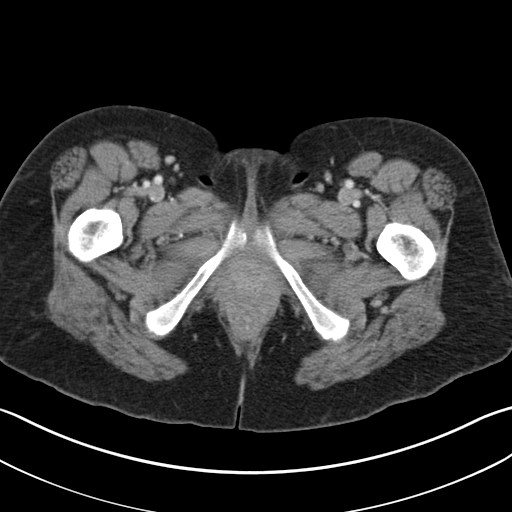
[im 4/92  bone]
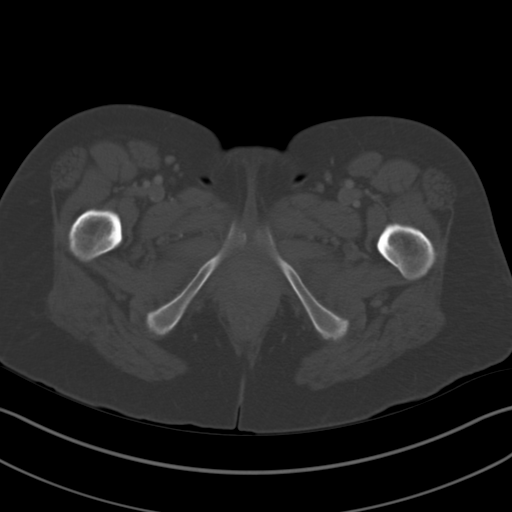
[im 12/92  soft-tissue]
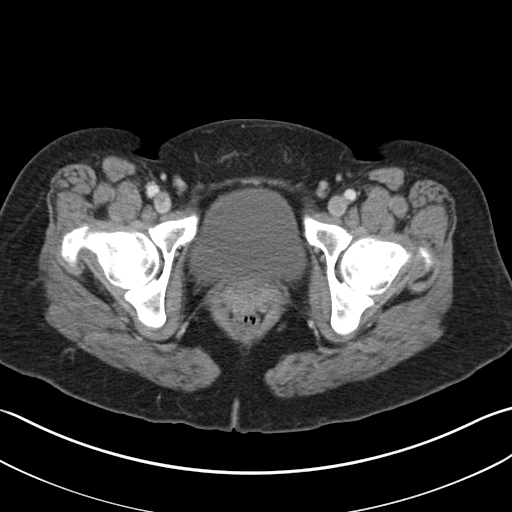
[im 19/92  soft-tissue]
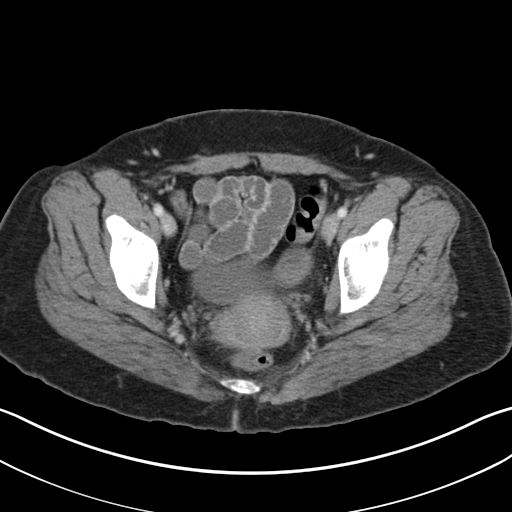
[im 27/92  soft-tissue]
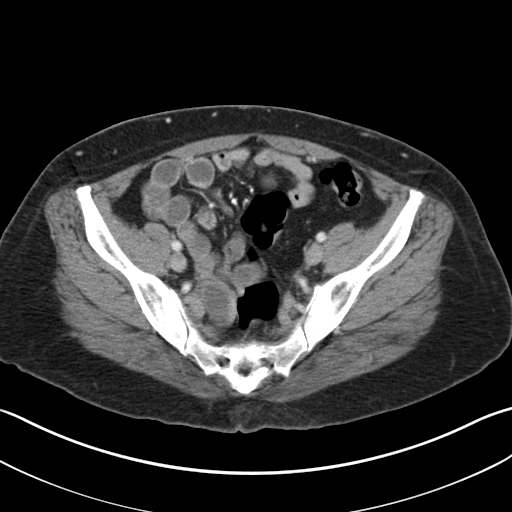
[im 35/92  soft-tissue]
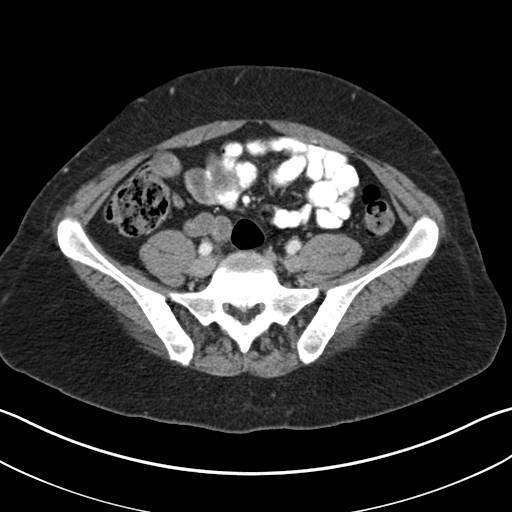
[im 42/92  soft-tissue]
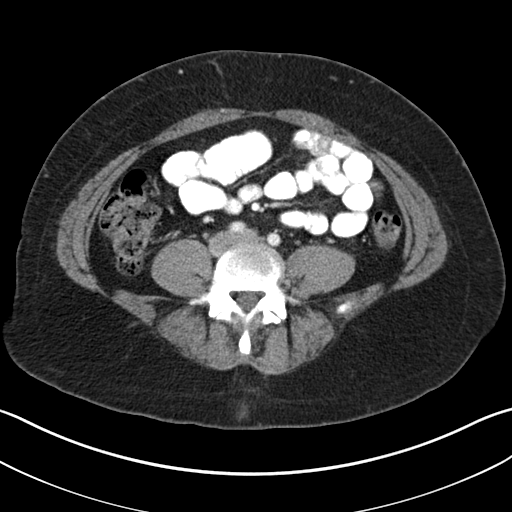
[im 50/92  soft-tissue]
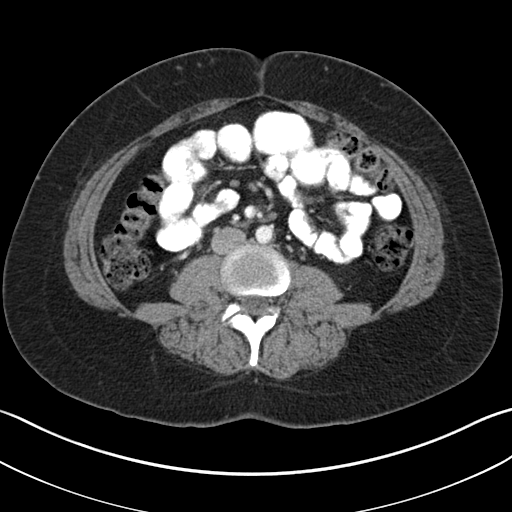
[im 57/92  soft-tissue]
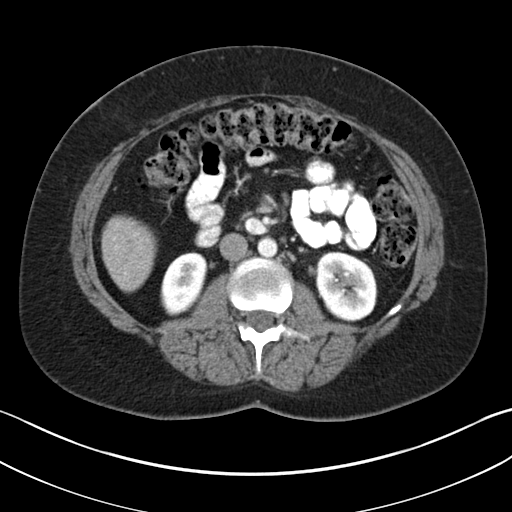
[im 65/92  soft-tissue]
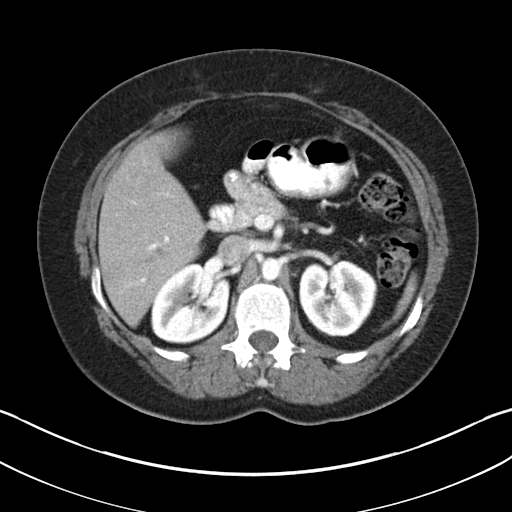
[im 65/92  bone]
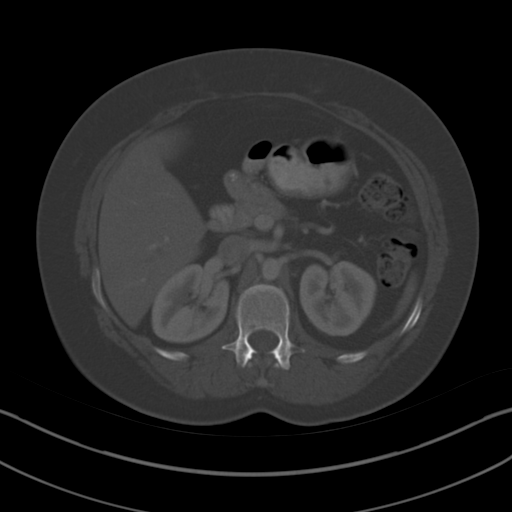
[im 73/92  soft-tissue]
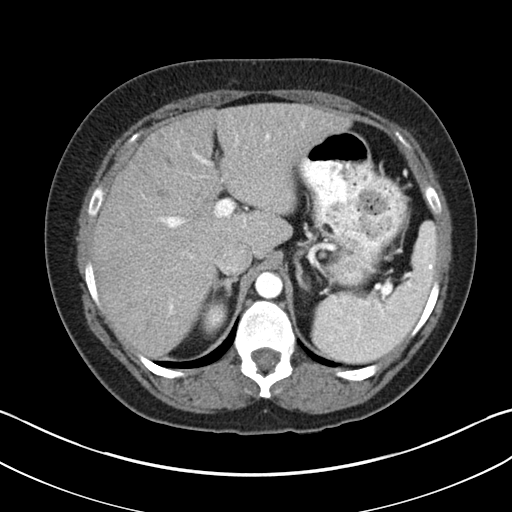
[im 76/92  lung]
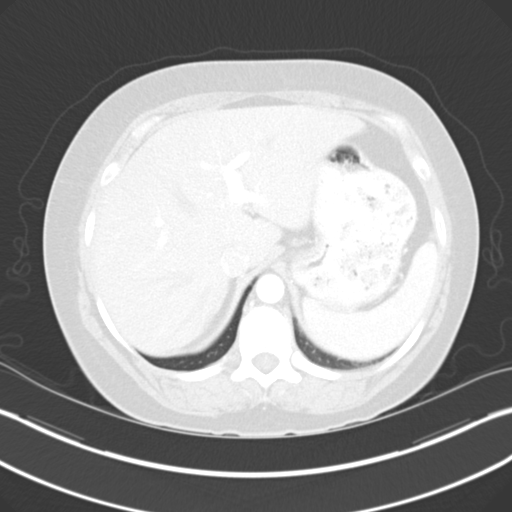
[im 80/92  soft-tissue]
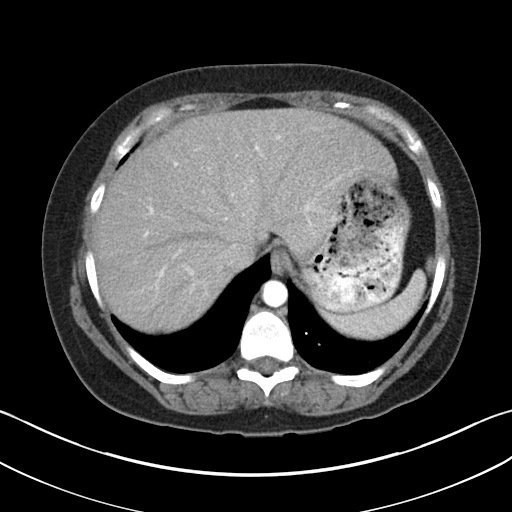
[im 80/92  lung]
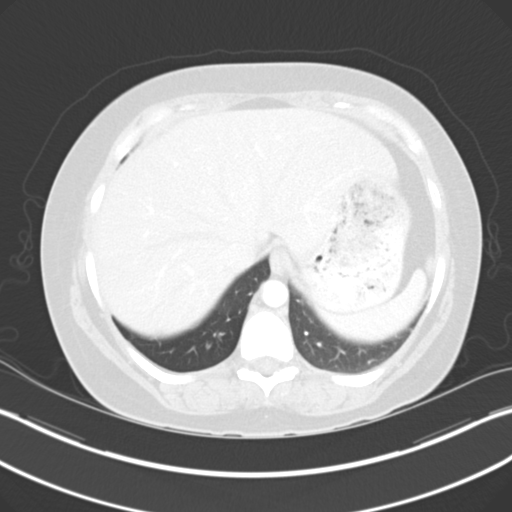
[im 84/92  lung]
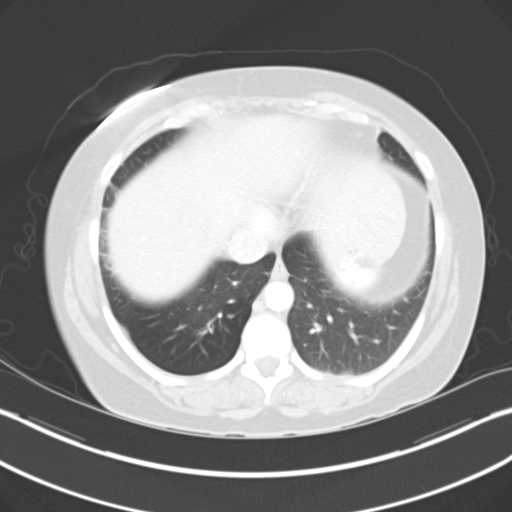
[im 88/92  soft-tissue]
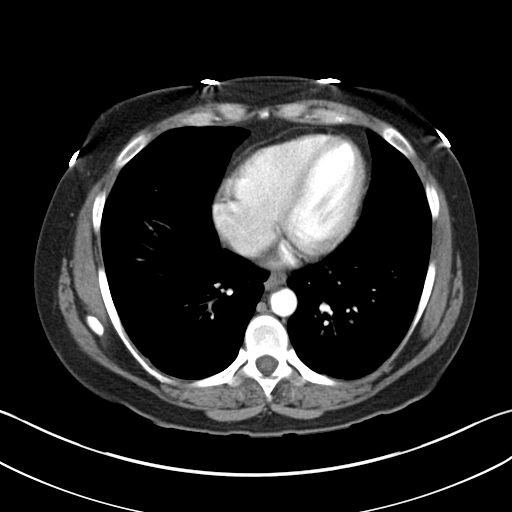
[im 88/92  lung]
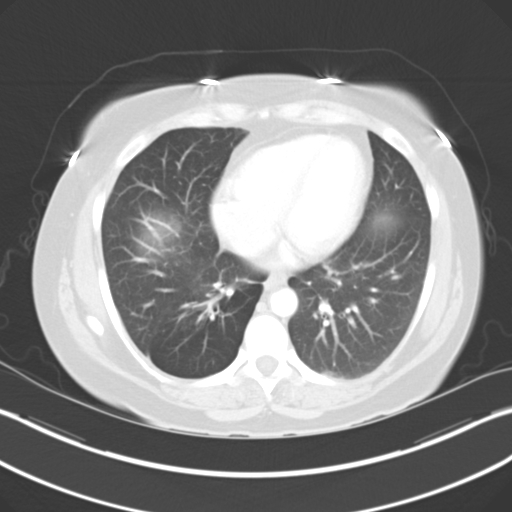

[14 of 32 positions shown; findings below may reference images not displayed]

FINDINGS: The visualized lung bases are clear.

The liver and spleen are unremarkable in appearance.  The patient
is status post cholecystectomy, with clips noted at the gallbladder
fossa.  The patient is status post Whipple procedure; the
associated bowel anastomosis is grossly unremarkable, without
evidence obstruction.  Scattered calcification and heterogeneity is
noted along the remaining pancreatic tail, reflecting sequelae of
chronic pancreatitis.

The adrenal glands are within normal limits.

The kidneys are unremarkable in appearance.  There is no evidence
of hydronephrosis.  No renal or ureteral stones are seen.  No
perinephric stranding is appreciated.

No free fluid is identified.  The small bowel is unremarkable in
appearance.  The stomach is within normal limits.  No acute
vascular abnormalities are seen.

The appendix is relatively short and normal in caliber, without
evidence for appendicitis.  The colon is partially filled with
stool and is unremarkable in appearance.

The bladder is mildly distended and grossly unremarkable.  The
patient is status post hysterectomy; there is slight prominence of
the vaginal cuff.  A 3.8 cm left adnexal cystic lesion is noted.
The right ovary is unremarkable in appearance.  No inguinal
lymphadenopathy is seen.

No acute osseous abnormalities are identified.  There is mild
chronic loss of height involving vertebral body T11, stable from
6726.
IMPRESSION: 1.  No acute abnormality seen within the abdomen or pelvis.  Status
post Whipple procedure.
2.  Scattered calcification and heterogeneity along the remaining
pancreatic tail, reflecting sequelae of chronic pancreatitis.

3.  3.8 cm left adnexal cystic lesion noted.  Given the patient's
age, this is more likely a simple cyst, though pelvic ultrasound is
recommended for further evaluation, when and as deemed clinically
appropriate.

## 2014-11-04 ENCOUNTER — Emergency Department (HOSPITAL_COMMUNITY)
Admission: EM | Admit: 2014-11-04 | Discharge: 2014-11-05 | Disposition: A | Discharge status: Other Acute Inpatient Hospital | Payer: Self-pay | Attending: Emergency Medicine | Admitting: Emergency Medicine

## 2014-11-04 ENCOUNTER — Encounter (HOSPITAL_COMMUNITY): Payer: Self-pay | Admitting: Registered Nurse

## 2014-11-04 DIAGNOSIS — R45851 Suicidal ideations: Secondary | ICD-10-CM

## 2014-11-04 DIAGNOSIS — T402X2A Poisoning by other opioids, intentional self-harm, initial encounter: Secondary | ICD-10-CM | POA: Insufficient documentation

## 2014-11-04 DIAGNOSIS — Z3202 Encounter for pregnancy test, result negative: Secondary | ICD-10-CM | POA: Insufficient documentation

## 2014-11-04 DIAGNOSIS — Z720 Tobacco use: Secondary | ICD-10-CM | POA: Insufficient documentation

## 2014-11-04 DIAGNOSIS — Z791 Long term (current) use of non-steroidal anti-inflammatories (NSAID): Secondary | ICD-10-CM | POA: Insufficient documentation

## 2014-11-04 DIAGNOSIS — T50902A Poisoning by unspecified drugs, medicaments and biological substances, intentional self-harm, initial encounter: Secondary | ICD-10-CM

## 2014-11-04 DIAGNOSIS — Y9289 Other specified places as the place of occurrence of the external cause: Secondary | ICD-10-CM | POA: Insufficient documentation

## 2014-11-04 DIAGNOSIS — F332 Major depressive disorder, recurrent severe without psychotic features: Secondary | ICD-10-CM | POA: Diagnosis present

## 2014-11-04 DIAGNOSIS — F319 Bipolar disorder, unspecified: Secondary | ICD-10-CM | POA: Insufficient documentation

## 2014-11-04 DIAGNOSIS — Y998 Other external cause status: Secondary | ICD-10-CM | POA: Insufficient documentation

## 2014-11-04 DIAGNOSIS — T1491 Suicide attempt: Secondary | ICD-10-CM | POA: Insufficient documentation

## 2014-11-04 DIAGNOSIS — Y9389 Activity, other specified: Secondary | ICD-10-CM | POA: Insufficient documentation

## 2014-11-04 DIAGNOSIS — Z79899 Other long term (current) drug therapy: Secondary | ICD-10-CM | POA: Insufficient documentation

## 2014-11-04 DIAGNOSIS — R000 Tachycardia, unspecified: Secondary | ICD-10-CM | POA: Insufficient documentation

## 2014-11-04 LAB — RAPID URINE DRUG SCREEN, HOSP PERFORMED
Amphetamines: NOT DETECTED
BARBITURATES: NOT DETECTED
Benzodiazepines: NOT DETECTED
COCAINE: NOT DETECTED
Opiates: POSITIVE — AB
Tetrahydrocannabinol: NOT DETECTED

## 2014-11-04 LAB — COMPREHENSIVE METABOLIC PANEL
ALBUMIN: 4 g/dL (ref 3.5–5.0)
ALK PHOS: 66 U/L (ref 38–126)
ALT: 31 U/L (ref 14–54)
ANION GAP: 9 (ref 5–15)
AST: 24 U/L (ref 15–41)
BILIRUBIN TOTAL: 0.3 mg/dL (ref 0.3–1.2)
BUN: 13 mg/dL (ref 6–20)
CALCIUM: 8.7 mg/dL — AB (ref 8.9–10.3)
CO2: 22 mmol/L (ref 22–32)
Chloride: 110 mmol/L (ref 101–111)
Creatinine, Ser: 0.63 mg/dL (ref 0.44–1.00)
GLUCOSE: 91 mg/dL (ref 65–99)
POTASSIUM: 3.7 mmol/L (ref 3.5–5.1)
Sodium: 141 mmol/L (ref 135–145)
TOTAL PROTEIN: 6.4 g/dL — AB (ref 6.5–8.1)

## 2014-11-04 LAB — ACETAMINOPHEN LEVEL

## 2014-11-04 LAB — CBC
HEMATOCRIT: 39.2 % (ref 36.0–46.0)
Hemoglobin: 13.3 g/dL (ref 12.0–15.0)
MCH: 30.7 pg (ref 26.0–34.0)
MCHC: 33.9 g/dL (ref 30.0–36.0)
MCV: 90.5 fL (ref 78.0–100.0)
PLATELETS: 133 10*3/uL — AB (ref 150–400)
RBC: 4.33 MIL/uL (ref 3.87–5.11)
RDW: 13.3 % (ref 11.5–15.5)
WBC: 8.2 10*3/uL (ref 4.0–10.5)

## 2014-11-04 LAB — CBG MONITORING, ED: GLUCOSE-CAPILLARY: 80 mg/dL (ref 65–99)

## 2014-11-04 LAB — I-STAT BETA HCG BLOOD, ED (MC, WL, AP ONLY): I-stat hCG, quantitative: <5 m[IU]/mL (ref <5)

## 2014-11-04 LAB — ETHANOL: ALCOHOL ETHYL (B): 229 mg/dL — AB (ref <5)

## 2014-11-04 LAB — SALICYLATE LEVEL: Salicylate Lvl: <4 mg/dL (ref 2.8–30.0)

## 2014-11-04 MED ORDER — CHARCOAL ACTIVATED PO LIQD
50.0000 g | Freq: Once | ORAL | Status: AC
  Administered 2014-11-04: 50 g via ORAL
  Filled 2014-11-04: qty 240

## 2014-11-04 MED ORDER — NALOXONE HCL 1 MG/ML IJ SOLN: 1.0000 mg | INTRAMUSCULAR | Status: DC | PRN

## 2014-11-04 NOTE — ED Notes (Signed)
[facility] called to check patient's status/VS.

## 2014-11-04 NOTE — ED Notes (Signed)
Bed: RESB Expected date:  Expected time:  Means of arrival:  Comments: EMS 

## 2014-11-04 NOTE — ED Notes (Addendum)
Patient's boyfriend's mother visiting. Both were tearful.
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
## 2014-11-04 NOTE — BHH Counselor (Signed)
Disposition: Per Hulan Fess, NP, Pt meets inpt tx criteria.   Per Rutha Bouchard, no 300 hall bed at [facility] currently but discharges expected later today.   Unable to reach Dr Dwan Bolt, RN informed of disposition.   Cyndie Mull, [facility] Triage Specialist

## 2014-11-04 NOTE — Consult Note (Signed)
[facility] Face-to-Face Psychiatry Consult   Reason for Consult:  Overdose Referring Physician:  EDP Patient Identification: Katherine Bond MRN:  [id number] Principal Diagnosis: MDD (major depressive disorder), recurrent severe, without psychosis Diagnosis:   Patient Active Problem List   Diagnosis Date Noted  . MDD (major depressive disorder), recurrent severe, without psychosis [F33.2]     Total Time spent with patient: 20 minutes  Subjective:   Katherine Bond is a 39 y.o. female patient presents to [facility] via EMS after overdose (suicide attempt).  HPI:  Patient is groggy in bed.  Patient states that she took an overdose of medication with the intent of killing her self.  "I didn't want to wake up."  Patient unable to continue with interview related to grogginess and sore throat.   HPI Elements:   Location:  Overdose. Quality:  Suicide attempt. Severity:  Sever. Duration:  1 day.  Past Medical History:  Past Medical History  Diagnosis Date  . Bipolar 1 disorder     Past Surgical History  Procedure Laterality Date  . Pancreaticoduodenectomy    . Cholecystectomy    . Abdominal hysterectomy      partial   . Dental surgery     Family History: History reviewed. No pertinent family history. Social History:  History  Alcohol Use  . Yes    Comment: 5th of vodka in two days     History  Drug Use No    Social History   Social History  . Marital Status: Divorced    Spouse Name: N/A  . Number of Children: N/A  . Years of Education: N/A   Social History Main Topics  . Smoking status: Current Every Day Smoker -- 1.00 packs/day for 15 years    Types: Cigarettes  . Smokeless tobacco: None  . Alcohol Use: Yes     Comment: 5th of vodka in two days  . Drug Use: No  . Sexual Activity: Yes   Other Topics Concern  . None   Social History Narrative   Additional Social History:    Pain Medications: See PTA List Prescriptions: See PTA List Over the Counter: See PTA List History of  alcohol / drug use?: Yes Longest period of sobriety (when/how long): Pt reports being sober for years at a time interrupted by periods of binge drinking Negative Consequences of Use: Personal relationships Withdrawal Symptoms:  (Pt denies) Name of Substance 1: Etoh 1 - Age of First Use: Teens 1 - Amount (size/oz): Multiple beers (pt unsure of sizes) 1 - Frequency: Occasional binges 1 - Duration: UTA 1 - Last Use / Amount: This morning, 11/04/14 (2 beers of unknown size)                   Allergies:   Allergies  Allergen Reactions  . Demerol Anaphylaxis and Rash  . Morphine And Related Anaphylaxis and Rash    Labs:  Results for orders placed or performed during the hospital encounter of 11/04/14 (from the past 48 hour(s))  CBG monitoring, ED     Status: None   Collection Time: 11/04/14  2:38 AM  Result Value Ref Range   Glucose-Capillary 80 65 - 99 mg/dL  Comprehensive metabolic panel     Status: Abnormal   Collection Time: 11/04/14  2:39 AM  Result Value Ref Range   Sodium 141 135 - 145 mmol/L   Potassium 3.7 3.5 - 5.1 mmol/L   Chloride 110 101 - 111 mmol/L   CO2 22 22 - 32  mmol/L   Glucose, Bld 91 65 - 99 mg/dL   BUN 13 6 - 20 mg/dL   Creatinine, Ser 0.63 0.44 - 1.00 mg/dL   Calcium 8.7 (L) 8.9 - 10.3 mg/dL   Total Protein 6.4 (L) 6.5 - 8.1 g/dL   Albumin 4.0 3.5 - 5.0 g/dL   AST 24 15 - 41 U/L   ALT 31 14 - 54 U/L   Alkaline Phosphatase 66 38 - 126 U/L   Total Bilirubin 0.3 0.3 - 1.2 mg/dL   GFR calc non Af Amer >60 >60 mL/min   GFR calc Af Amer >60 >60 mL/min    Comment: (NOTE) The eGFR has been calculated using the CKD EPI equation. This calculation has not been validated in all clinical situations. eGFR's persistently <60 mL/min signify possible Chronic Kidney Disease.    Anion gap 9 5 - 15  Ethanol (ETOH)     Status: Abnormal   Collection Time: 11/04/14  2:39 AM  Result Value Ref Range   Alcohol, Ethyl (B) 229 (H) <5 mg/dL    Comment:        LOWEST  DETECTABLE LIMIT FOR SERUM ALCOHOL IS 5 mg/dL FOR MEDICAL PURPOSES ONLY   Salicylate level     Status: None   Collection Time: 11/04/14  2:39 AM  Result Value Ref Range   Salicylate Lvl <0.0 2.8 - 30.0 mg/dL  Acetaminophen level     Status: Abnormal   Collection Time: 11/04/14  2:39 AM  Result Value Ref Range   Acetaminophen (Tylenol), Serum <10 (L) 10 - 30 ug/mL    Comment:        THERAPEUTIC CONCENTRATIONS VARY SIGNIFICANTLY. A RANGE OF 10-30 ug/mL MAY BE AN EFFECTIVE CONCENTRATION FOR MANY PATIENTS. HOWEVER, SOME ARE BEST TREATED AT CONCENTRATIONS OUTSIDE THIS RANGE. ACETAMINOPHEN CONCENTRATIONS >150 ug/mL AT 4 HOURS AFTER INGESTION AND >50 ug/mL AT 12 HOURS AFTER INGESTION ARE OFTEN ASSOCIATED WITH TOXIC REACTIONS.   CBC     Status: Abnormal   Collection Time: 11/04/14  2:39 AM  Result Value Ref Range   WBC 8.2 4.0 - 10.5 K/uL   RBC 4.33 3.87 - 5.11 MIL/uL   Hemoglobin 13.3 12.0 - 15.0 g/dL   HCT 39.2 36.0 - 46.0 %   MCV 90.5 78.0 - 100.0 fL   MCH 30.7 26.0 - 34.0 pg   MCHC 33.9 30.0 - 36.0 g/dL   RDW 13.3 11.5 - 15.5 %   Platelets 133 (L) 150 - 400 K/uL  Urine rapid drug screen (hosp performed) (Not at [facility])     Status: Abnormal   Collection Time: 11/04/14  2:42 AM  Result Value Ref Range   Opiates POSITIVE (A) NONE DETECTED   Cocaine NONE DETECTED NONE DETECTED   Benzodiazepines NONE DETECTED NONE DETECTED   Amphetamines NONE DETECTED NONE DETECTED   Tetrahydrocannabinol NONE DETECTED NONE DETECTED   Barbiturates NONE DETECTED NONE DETECTED    Comment:        DRUG SCREEN FOR MEDICAL PURPOSES ONLY.  IF CONFIRMATION IS NEEDED FOR ANY PURPOSE, NOTIFY LAB WITHIN 5 DAYS.        LOWEST DETECTABLE LIMITS FOR URINE DRUG SCREEN Drug Class       Cutoff (ng/mL) Amphetamine      1000 Barbiturate      200 Benzodiazepine   938 Tricyclics       182 Opiates          300 Cocaine          300  THC              50   I-Stat beta hCG blood, ED ([facility], [facility], [facility] only)      Status: None   Collection Time: 11/04/14  2:45 AM  Result Value Ref Range   I-stat hCG, quantitative <5.0 <5 mIU/mL   Comment 3            Comment:   GEST. AGE      CONC.  (mIU/mL)   <=1 WEEK        5 - 50     2 WEEKS       50 - 500     3 WEEKS       100 - 10,000     4 WEEKS     1,000 - 30,000        FEMALE AND NON-PREGNANT FEMALE:     LESS THAN 5 mIU/mL     Vitals: Blood pressure 111/68, pulse 115, temperature 98.2 F (36.8 C), temperature source Oral, resp. rate 21, SpO2 96 %.  Risk to Self: Suicidal Ideation: Yes-Currently Present Suicidal Intent: Yes-Currently Present Is patient at risk for suicide?: Yes Suicidal Plan?: Yes-Currently Present Specify Current Suicidal Plan: Overdose Access to Means: Yes Specify Access to Suicidal Means: Access to meds, including narcotics What has been your use of drugs/alcohol within the last 12 months?: Intermittent binges on Etoh How many times?:  (UTA) Other Self Harm Risks: Alcohol abuse Triggers for Past Attempts: Unknown Intentional Self Injurious Behavior:  (UTA) Risk to Others: Homicidal Ideation: No Thoughts of Harm to Others: No Current Homicidal Intent: No Current Homicidal Plan: No Access to Homicidal Means: No Identified Victim: n/a History of harm to others?: No Assessment of Violence: None Noted Violent Behavior Description: No known hx of violent behavior Does patient have access to weapons?: No Criminal Charges Pending?: No Does patient have a court date: No Prior Inpatient Therapy: Prior Inpatient Therapy: Yes Prior Therapy Dates: 2012 Prior Therapy Facilty/Provider(s): [facility] Reason for Treatment: SI, Etoh abuse Prior Outpatient Therapy: Prior Outpatient Therapy:  (UTA) Prior Therapy Dates: UTA Prior Therapy Facilty/Provider(s): UTA Reason for Treatment: UTA Does patient have an ACCT team?: No Does patient have Intensive In-House Services?  : No Does patient have [facility] services? : Unknown Does patient have P4CC  services?: No  Current Facility-Administered Medications  Medication Dose Route Frequency Provider Last Rate Last Dose  . naloxone (NARCAN) injection 1 mg  1 mg Intravenous PRN Everlene Balls, MD       Current Outpatient Prescriptions  Medication Sig Dispense Refill  . acetaminophen (TYLENOL) 500 MG tablet Take 500 mg by mouth every 6 (six) hours as needed for moderate pain.    . famotidine (PEPCID) 20 MG tablet Take 1 tablet (20 mg total) by mouth 2 (two) times daily. (Patient not taking: Reported on 11/04/2014) 30 tablet 0  . HYDROcodone-acetaminophen (HYCET) 7.5-325 mg/15 ml solution 36ml PO every 8 hours as needed (Patient not taking: Reported on 11/04/2014) 60 mL 0  . naproxen (NAPROSYN) 500 MG tablet Take 1 tablet (500 mg total) by mouth 2 (two) times daily. (Patient not taking: Reported on 11/04/2014) 30 tablet 0  . pantoprazole (PROTONIX) 20 MG tablet Take 1 tablet (20 mg total) by mouth daily. (Patient not taking: Reported on 11/04/2014) 30 tablet 0  . ranitidine (ZANTAC) 150 MG tablet Take 1 tablet (150 mg total) by mouth 2 (two) times daily. 30 tablet 0    Musculoskeletal: Strength & Muscle Tone:  within normal limits Gait & Station: normal, Unable to assess at this time Patient leans: N/A  Psychiatric Specialty Exam: Physical Exam  Review of Systems  Unable to perform ROS: acuity of condition    Blood pressure 111/68, pulse 115, temperature 98.2 F (36.8 C), temperature source Oral, resp. rate 21, SpO2 96 %.There is no height or weight on file to calculate BMI.  General Appearance: Disheveled  Eye Contact::  Minimal  Speech:  Slow and hoarse  Volume:  Decreased  Mood:  Depressed  Affect:  Depressed and Flat  Thought Process:  Unable to determine at this time  Orientation:  Other:  Person and place  Thought Content:  Unable to determine  Suicidal Thoughts:  Yes.  with intent/plan  Homicidal Thoughts:  No  Memory:  Unable to determine at this time  Judgement:  Poor   Insight:  Lacking and Shallow  Psychomotor Activity:  Decreased  Concentration:  Poor  Recall:  Poor  Fund of Knowledge:Poor  Language: Poor  Akathisia:  Negative  Handed:  Right  AIMS (if indicated):     Assets:  Others:  Unable to determine at this time  ADL's:  Impaired at this time  Cognition: WNL  Sleep:      Medical Decision Making: Review or order clinical lab tests (1), Review and summation of old records (2), Review of Last Therapy Session (1), Independent Review of image, tracing or specimen (2) and Review of Medication Regimen & Side Effects (2)  Treatment Plan Summary: Plan Reevaluate one patient has been medically cleared      Plan:  Recommend psychiatric Inpatient admission when medically cleared.   Disposition: Psychiatrist will reevaluate patient once medically cleared.    Earleen Newport, FNP-BC 11/04/2014 6:26 PM Patient seen face-to-face for psychiatric evaluation, chart reviewed and case discussed with the physician extender and developed treatment plan. Reviewed the information documented and agree with the treatment plan. Corena Pilgrim, MD

## 2014-11-04 NOTE — ED Provider Notes (Signed)
CSN: [id number]     Arrival date & time 11/04/14  0219 History  This chart was scribed for Tomasita Crumble, MD by Tanda Rockers, ED Scribe. This patient was seen in room RESB/RESB and the patient's care was started at 2:29 AM.   Chief Complaint  Patient presents with  . Suicidal  . Drug Overdose   The history is provided by the patient. No language interpreter was used.     HPI Comments: Katherine Bond is a 39 y.o. female brought in by ambulance, with hx bipolar disorder who presents to the Emergency Department for suicide attempt after taking multiple Morphine tablets approximately 30 minutes ago. Pt has allergy to morphine. She states she also drank 2 beers tonight, but cannot say what size beers. Pt notes previous attempts of hurting herself. Upon examination, pt states "just let me die." Per triage note, EMS could not find a bottle of Morphine in the house. Pt is not prescribed this medication and significant other is not prescribed this medication either.   Past Medical History  Diagnosis Date  . Bipolar 1 disorder    Past Surgical History  Procedure Laterality Date  . Pancreaticoduodenectomy    . Cholecystectomy    . Abdominal hysterectomy      partial   . Dental surgery     No family history on file. Social History  Substance Use Topics  . Smoking status: Current Every Day Smoker -- 1.00 packs/day for 15 years    Types: Cigarettes  . Smokeless tobacco: Not on file  . Alcohol Use: Yes     Comment: 5th of vodka in two days   OB History    No data available     Review of Systems   10 Systems reviewed and all are negative for acute change except as noted in the HPI.  Allergies  Demerol and Morphine and related  Home Medications   Prior to Admission medications   Medication Sig Start Date End Date Taking? Authorizing Provider  desvenlafaxine (PRISTIQ) 50 MG 24 hr tablet Take 50 mg by mouth every morning.     Historical Provider, MD  famotidine (PEPCID) 20 MG tablet Take  1 tablet (20 mg total) by mouth 2 (two) times daily. 04/12/12   Sunnie Nielsen, MD  HYDROcodone-acetaminophen (HYCET) 7.5-325 mg/15 ml solution 15ml PO every 8 hours as needed 04/12/12   Sunnie Nielsen, MD  HYDROcodone-acetaminophen (NORCO/VICODIN) 5-325 MG per tablet Take 1-2 tablets by mouth every 6 (six) hours as needed. For pain 03/25/12   Linwood Dibbles, MD  hydrOXYzine (ATARAX/VISTARIL) 50 MG tablet Take 50-100 mg by mouth 3 (three) times daily as needed. Anxiety    Historical Provider, MD  naproxen (NAPROSYN) 500 MG tablet Take 1 tablet (500 mg total) by mouth 2 (two) times daily. 03/25/12   Linwood Dibbles, MD  pantoprazole (PROTONIX) 20 MG tablet Take 1 tablet (20 mg total) by mouth daily. 04/12/12   Sunnie Nielsen, MD  ranitidine (ZANTAC) 150 MG tablet Take 1 tablet (150 mg total) by mouth 2 (two) times daily. 02/03/11 02/03/12  Gwyneth Sprout, MD  traMADol (ULTRAM) 50 MG tablet Take 50 mg by mouth every 6 (six) hours as needed. For pain    Historical Provider, MD   Triage Vitals: BP 131/80 mmHg  Pulse 112  Temp(Src) 98.2 F (36.8 C) (Oral)  Resp 20  SpO2 97%   Physical Exam  Constitutional: She is oriented to person, place, and time. She appears well-developed and well-nourished. No distress.  Tearful  on examination.   HENT:  Head: Normocephalic and atraumatic.  Nose: Nose normal.  Mouth/Throat: Oropharynx is clear and moist. No oropharyngeal exudate.  Eyes: Conjunctivae and EOM are normal. Pupils are equal, round, and reactive to light. No scleral icterus.  Neck: Normal range of motion. Neck supple. No JVD present. No tracheal deviation present. No thyromegaly present.  Cardiovascular: Regular rhythm and normal heart sounds.  Tachycardia present.  Exam reveals no gallop and no friction rub.   No murmur heard. Pulmonary/Chest: Effort normal and breath sounds normal. No respiratory distress. She has no wheezes. She exhibits no tenderness.  Abdominal: Soft. Bowel sounds are normal. She exhibits no  distension and no mass. There is no tenderness. There is no rebound and no guarding.  Musculoskeletal: Normal range of motion. She exhibits no edema or tenderness.  Lymphadenopathy:    She has no cervical adenopathy.  Neurological: She is alert and oriented to person, place, and time. No cranial nerve deficit. She exhibits normal muscle tone.  Skin: Skin is warm and dry. No rash noted. No erythema. No pallor.  Nursing note and vitals reviewed.   ED Course  Procedures (including critical care time)  DIAGNOSTIC STUDIES: Oxygen Saturation is 97% on RA, normal by my interpretation.    COORDINATION OF CARE: 2:33 AM-Discussed treatment plan which includes CMP, EtOH, Salicylate, Acetaminophen level, CBC, rapid drug screen, CBG, beta hCG blood, and consult with TTS with pt at bedside and pt agreed to plan.    Labs Review Labs Reviewed  COMPREHENSIVE METABOLIC PANEL - Abnormal; Notable for the following:    Calcium 8.7 (*)    Total Protein 6.4 (*)    All other components within normal limits  ETHANOL - Abnormal; Notable for the following:    Alcohol, Ethyl (B) 229 (*)    All other components within normal limits  ACETAMINOPHEN LEVEL - Abnormal; Notable for the following:    Acetaminophen (Tylenol), Serum <10 (*)    All other components within normal limits  CBC - Abnormal; Notable for the following:    Platelets 133 (*)    All other components within normal limits  URINE RAPID DRUG SCREEN, HOSP PERFORMED - Abnormal; Notable for the following:    Opiates POSITIVE (*)    All other components within normal limits  SALICYLATE LEVEL  CBG MONITORING, ED  I-STAT BETA HCG BLOOD, ED ([facility], [facility], [facility] ONLY)    Imaging Review No results found. I have personally reviewed and evaluated these images and lab results as part of my medical decision-making.   EKG Interpretation   Date/Time:  Friday November 04 2014 02:23:14 EDT Ventricular Rate:  102 PR Interval:  130 QRS Duration: 83 QT Interval:   348 QTC Calculation: 453 R Axis:   36 Text Interpretation:  Sinus tachycardia Borderline T abnormalities,  inferior leads No significant change since last tracing Confirmed by Erroll Luna [phone]) on 11/04/2014 2:28:03 AM      MDM   Final diagnoses:  None    Patient presents emergency department after a suicide attempt. She states she took multiple morphine tablets and has an allergy to morphine. She also admits to drinking alcohol.  She will be held in the emergency department for TTS evaluation. She was ordered charcoal. She has no vital sign abnormalities to suggest an opiate overdose but will be monitored closely. She has no constriction of her pupils. Patient denies other illicit drugs tonight.  I personally performed the services described in this documentation, which  was scribed in my presence. The recorded information has been reviewed and is accurate.      Tomasita Crumble, MD 11/04/14 [phone]

## 2014-11-04 NOTE — ED Notes (Signed)
Patient asked to use the phone and when she hung up she was crying and then asked for "someone to give her something to help her die."

## 2014-11-04 NOTE — ED Notes (Signed)
Patient arrives by EMS with Suicide attempt, stating she took 15-20 Morphine tablets ~25 minutes ago.  EMS states no bottle found in house, patient is not on this med and significant others are not on this med.

## 2014-11-04 NOTE — ED Notes (Signed)
Katherine Bond with poison control: watch for drowsiness, hypotension, bradycardia, obs 4-6 hours, possibly give activated charcoal.

## 2014-11-04 NOTE — ED Notes (Signed)
Due to Pt's response after using the phone earlier, staff decided to restrict visitors.  Pt's boyfriend showed up asking to see her a became upset when he was denied.  This Charge RN spoke to him, explained the situation, and he verbalized understanding.  He brought her a stuffed animal and two cards, both were put in her belongings bag.

## 2014-11-05 ENCOUNTER — Inpatient Hospital Stay (HOSPITAL_COMMUNITY)
Admission: AD | Admit: 2014-11-05 | Discharge: 2014-11-14 | DRG: 885 | Disposition: A | Discharge status: Home / Self Care | Payer: Federal, State, Local not specified - Other | Source: Intra-hospital | Attending: Psychiatry | Admitting: Psychiatry

## 2014-11-05 ENCOUNTER — Encounter (HOSPITAL_COMMUNITY): Payer: Self-pay | Admitting: *Deleted

## 2014-11-05 DIAGNOSIS — F332 Major depressive disorder, recurrent severe without psychotic features: Principal | ICD-10-CM | POA: Diagnosis present

## 2014-11-05 DIAGNOSIS — F17210 Nicotine dependence, cigarettes, uncomplicated: Secondary | ICD-10-CM | POA: Diagnosis present

## 2014-11-05 DIAGNOSIS — F329 Major depressive disorder, single episode, unspecified: Secondary | ICD-10-CM | POA: Diagnosis present

## 2014-11-05 DIAGNOSIS — R569 Unspecified convulsions: Secondary | ICD-10-CM | POA: Insufficient documentation

## 2014-11-05 DIAGNOSIS — F410 Panic disorder [episodic paroxysmal anxiety] without agoraphobia: Secondary | ICD-10-CM | POA: Diagnosis present

## 2014-11-05 DIAGNOSIS — R45851 Suicidal ideations: Secondary | ICD-10-CM | POA: Diagnosis present

## 2014-11-05 DIAGNOSIS — G4700 Insomnia, unspecified: Secondary | ICD-10-CM | POA: Diagnosis present

## 2014-11-05 MED ORDER — LORAZEPAM 2 MG/ML IJ SOLN: 2.0000 mg | INTRAMUSCULAR | Status: DC | PRN

## 2014-11-05 MED ORDER — HYDROXYZINE HCL 25 MG PO TABS
25.0000 mg | ORAL_TABLET | Freq: Three times a day (TID) | ORAL | Status: DC | PRN
  Administered 2014-11-05 (×2): 25 mg via ORAL
  Filled 2014-11-05 (×2): qty 1

## 2014-11-05 MED ORDER — MAGNESIUM HYDROXIDE 400 MG/5ML PO SUSP
30.0000 mL | Freq: Every day | ORAL | Status: DC | PRN
  Administered 2014-11-07: 30 mL via ORAL
  Filled 2014-11-05: qty 30

## 2014-11-05 MED ORDER — ACETAMINOPHEN 325 MG PO TABS
650.0000 mg | ORAL_TABLET | Freq: Four times a day (QID) | ORAL | Status: DC | PRN
  Administered 2014-11-05 – 2014-11-12 (×8): 650 mg via ORAL
  Filled 2014-11-05 (×8): qty 2

## 2014-11-05 MED ORDER — TRAZODONE HCL 50 MG PO TABS
50.0000 mg | ORAL_TABLET | Freq: Every evening | ORAL | Status: DC | PRN
  Administered 2014-11-05 – 2014-11-09 (×5): 50 mg via ORAL
  Filled 2014-11-05 (×5): qty 1

## 2014-11-05 MED ORDER — FLUOXETINE HCL 20 MG PO CAPS
20.0000 mg | ORAL_CAPSULE | Freq: Every day | ORAL | Status: DC
  Administered 2014-11-05 – 2014-11-07 (×3): 20 mg via ORAL
  Filled 2014-11-05 (×6): qty 1

## 2014-11-05 MED ORDER — ALUM & MAG HYDROXIDE-SIMETH 200-200-20 MG/5ML PO SUSP: 30.0000 mL | ORAL | Status: DC | PRN

## 2014-11-05 MED ORDER — LORAZEPAM 1 MG PO TABS
1.0000 mg | ORAL_TABLET | ORAL | Status: DC | PRN
  Administered 2014-11-06 – 2014-11-09 (×5): 1 mg via ORAL
  Filled 2014-11-05 (×5): qty 1

## 2014-11-05 MED ORDER — ONDANSETRON 4 MG PO TBDP
4.0000 mg | ORAL_TABLET | Freq: Four times a day (QID) | ORAL | Status: AC | PRN
  Administered 2014-11-05 – 2014-11-07 (×2): 4 mg via ORAL
  Filled 2014-11-05 (×3): qty 1

## 2014-11-05 MED ORDER — PHENYTOIN SODIUM EXTENDED 100 MG PO CAPS
100.0000 mg | ORAL_CAPSULE | Freq: Three times a day (TID) | ORAL | Status: DC
  Administered 2014-11-05 – 2014-11-08 (×10): 100 mg via ORAL
  Filled 2014-11-05 (×14): qty 1

## 2014-11-05 NOTE — H&P (Signed)
Psychiatric Admission Assessment Adult  Patient Identification: Katherine Bond MRN:  [id number] Date of Evaluation:  11/05/2014 Chief Complaint:  MDD,REC,SEV Principal Diagnosis: <principal problem not specified> Diagnosis:   Patient Active Problem List   Diagnosis Date Noted  . MDD (major depressive disorder), recurrent severe, without psychosis [F33.2]    History of Present Illness:: Patient states that she doesn't remember much.  "I have been depressed for a while."  Patient states that she took an overdose with an intent to kill her self.  States that her stressors are "My life sucks.  I don't have a job, my kids don't want to see me. I just don't want to be here."  Patient states that she is allergic to Morphine "In the hospital they told me it stopped me from breathing so I went and brought me some Morphine."  Patient states that she recently quit her job because she did not have transportation to get back and forth to work.  States that her 3 children ages 3, 4 and 15 yr old are living with their father (divorced) and doesn't want to see her.  States that she became homeless in April 2016 and has been living on and off with friends; states at this time she has no where to go.  Patient states that she is not close to any of her family and has no one that is supportive other than her boyfriend. (whom she got into an argument with in the ED but doesn't remember why and he was asked to leave) states "we don't argue he is the only one that is usually there for me.  I don't remember what happened.  I know he was mad cause I tried to kill myself." Patient states that she has a history of multiple suicide attempts "yes I've tried many, many, many times." and continues to endorse suicidal ideation stating that "I just don't want to be here anymore."  Patient states that she has nothing to live for and no religious or spiritual beliefs to keep her from taking her life.  Patient states that she has been on  multiple psychotropics in the past but doesn't remember them all some that she has tried that did not were are Celexa, Zoloft, Lamictal, Abilify, and Seroquel.  States that she has never tried Prozac, Lexapro, Lithium. Depakote, Tegretol, or  Trileptal. Patient denies homicidal ideation and history of violent behavior.  Patient denies psychosis and paranoia.  Patient states that she also uses drugs "usually pain pills"  Denies alcohol or other illicit drug use.   Elements:  Location:  Suicide attempt. Quality:  Worsening depression. Severity:  Sever. Duration:  Several days. Associated Signs/Symptoms: Depression Symptoms:  depressed mood, anhedonia, fatigue, feelings of worthlessness/guilt, difficulty concentrating, hopelessness, recurrent thoughts of death, suicidal thoughts with specific plan, suicidal attempt, loss of energy/fatigue, decreased appetite, (Hypo) Manic Symptoms:  Impulsivity, Irritable Mood, Anxiety Symptoms:  Excessive Worry, Psychotic Symptoms:  denies PTSD Symptoms: Did not answer Total Time spent with patient: 1 hour  Past Medical History:  Past Medical History  Diagnosis Date  . Bipolar 1 disorder     Past Surgical History  Procedure Laterality Date  . Pancreaticoduodenectomy    . Cholecystectomy    . Abdominal hysterectomy      partial   . Dental surgery     Family History: History reviewed. No pertinent family history. Social History:  History  Alcohol Use  . Yes    Comment: 5th of vodka in two days  History  Drug Use No    Social History   Social History  . Marital Status: Divorced    Spouse Name: N/A  . Number of Children: N/A  . Years of Education: N/A   Social History Main Topics  . Smoking status: Current Every Day Smoker -- 1.00 packs/day for 15 years    Types: Cigarettes  . Smokeless tobacco: None  . Alcohol Use: Yes     Comment: 5th of vodka in two days  . Drug Use: No  . Sexual Activity: Yes   Other Topics Concern   . None   Social History Narrative   Additional Social History:    Pain Medications: none Prescriptions: none Over the Counter: none History of alcohol / drug use?: No history of alcohol / drug abuse   Musculoskeletal: Strength & Muscle Tone: within normal limits Gait & Station: unsteady Patient leans: N/A  Psychiatric Specialty Exam: Physical Exam  Constitutional: She is oriented to person, place, and time.  Neck: Normal range of motion.  Respiratory: Effort normal.  Musculoskeletal: Normal range of motion.  Neurological: She is alert and oriented to person, place, and time.    Review of Systems  Constitutional: Negative for weight loss.  Gastrointestinal: Positive for nausea and vomiting.  Neurological: Positive for dizziness, seizures (States that she has a one seizure about every 1-2 weeks.  last prior to over dose was one week ago) and weakness.  Psychiatric/Behavioral: Positive for depression, suicidal ideas and substance abuse. Negative for hallucinations. The patient is nervous/anxious.   All other systems reviewed and are negative.   Blood pressure 105/65, pulse 57, temperature 99.3 F (37.4 C), temperature source Oral, resp. rate 18, height $RemoveBe'5\' 3"'EqXImuMIb$  (1.6 m), weight 58.968 kg (130 lb).Body mass index is 23.03 kg/(m^2).  General Appearance: Casual  Eye Contact::  Minimal  Speech:  Clear and Coherent and Slow  Volume:  Decreased  Mood:  Depressed, Hopeless and Worthless  Affect:  Depressed, Flat and Tearful  Thought Process:  Linear  Orientation:  Full (Time, Place, and Person)  Thought Content:  "I just don't want to be here no more"  Suicidal Thoughts:  Yes.  with intent/plan  Homicidal Thoughts:  No  Memory:  Immediate;   Poor Recent;   Poor Remote;   Poor  Judgement:  Impaired  Insight:  Lacking  Psychomotor Activity:  Decreased  Concentration:  Poor  Recall:  Poor  Fund of Knowledge:Good  Language: Good  Akathisia:  Negative  Handed:  Right  AIMS (if  indicated):     Assets:  Communication Skills  ADL's:  Intact  Cognition: WNL  Sleep:      Risk to Self: Is patient at risk for suicide?: No Risk to Others:   Prior Inpatient Therapy:   Prior Outpatient Therapy:    Alcohol Screening: 1. How often do you have a drink containing alcohol?: Never 9. Have you or someone else been injured as a result of your drinking?: No 10. Has a relative or friend or a doctor or another health worker been concerned about your drinking or suggested you cut down?: No Alcohol Use Disorder Identification Test Final Score (AUDIT): 0 Brief Intervention: AUDIT score less than 7 or less-screening does not suggest unhealthy drinking-brief intervention not indicated  Allergies:   Allergies  Allergen Reactions  . Demerol Anaphylaxis and Rash  . Morphine And Related Anaphylaxis and Rash   Lab Results:  Results for orders placed or performed during the hospital encounter of 11/04/14 (  from the past 48 hour(s))  CBG monitoring, ED     Status: None   Collection Time: 11/04/14  2:38 AM  Result Value Ref Range   Glucose-Capillary 80 65 - 99 mg/dL  Comprehensive metabolic panel     Status: Abnormal   Collection Time: 11/04/14  2:39 AM  Result Value Ref Range   Sodium 141 135 - 145 mmol/L   Potassium 3.7 3.5 - 5.1 mmol/L   Chloride 110 101 - 111 mmol/L   CO2 22 22 - 32 mmol/L   Glucose, Bld 91 65 - 99 mg/dL   BUN 13 6 - 20 mg/dL   Creatinine, Ser 0.63 0.44 - 1.00 mg/dL   Calcium 8.7 (L) 8.9 - 10.3 mg/dL   Total Protein 6.4 (L) 6.5 - 8.1 g/dL   Albumin 4.0 3.5 - 5.0 g/dL   AST 24 15 - 41 U/L   ALT 31 14 - 54 U/L   Alkaline Phosphatase 66 38 - 126 U/L   Total Bilirubin 0.3 0.3 - 1.2 mg/dL   GFR calc non Af Amer >60 >60 mL/min   GFR calc Af Amer >60 >60 mL/min    Comment: (NOTE) The eGFR has been calculated using the CKD EPI equation. This calculation has not been validated in all clinical situations. eGFR's persistently <60 mL/min signify possible Chronic  Kidney Disease.    Anion gap 9 5 - 15  Ethanol (ETOH)     Status: Abnormal   Collection Time: 11/04/14  2:39 AM  Result Value Ref Range   Alcohol, Ethyl (B) 229 (H) <5 mg/dL    Comment:        LOWEST DETECTABLE LIMIT FOR SERUM ALCOHOL IS 5 mg/dL FOR MEDICAL PURPOSES ONLY   Salicylate level     Status: None   Collection Time: 11/04/14  2:39 AM  Result Value Ref Range   Salicylate Lvl <9.6 2.8 - 30.0 mg/dL  Acetaminophen level     Status: Abnormal   Collection Time: 11/04/14  2:39 AM  Result Value Ref Range   Acetaminophen (Tylenol), Serum <10 (L) 10 - 30 ug/mL    Comment:        THERAPEUTIC CONCENTRATIONS VARY SIGNIFICANTLY. A RANGE OF 10-30 ug/mL MAY BE AN EFFECTIVE CONCENTRATION FOR MANY PATIENTS. HOWEVER, SOME ARE BEST TREATED AT CONCENTRATIONS OUTSIDE THIS RANGE. ACETAMINOPHEN CONCENTRATIONS >150 ug/mL AT 4 HOURS AFTER INGESTION AND >50 ug/mL AT 12 HOURS AFTER INGESTION ARE OFTEN ASSOCIATED WITH TOXIC REACTIONS.   CBC     Status: Abnormal   Collection Time: 11/04/14  2:39 AM  Result Value Ref Range   WBC 8.2 4.0 - 10.5 K/uL   RBC 4.33 3.87 - 5.11 MIL/uL   Hemoglobin 13.3 12.0 - 15.0 g/dL   HCT 39.2 36.0 - 46.0 %   MCV 90.5 78.0 - 100.0 fL   MCH 30.7 26.0 - 34.0 pg   MCHC 33.9 30.0 - 36.0 g/dL   RDW 13.3 11.5 - 15.5 %   Platelets 133 (L) 150 - 400 K/uL  Urine rapid drug screen (hosp performed) (Not at [facility])     Status: Abnormal   Collection Time: 11/04/14  2:42 AM  Result Value Ref Range   Opiates POSITIVE (A) NONE DETECTED   Cocaine NONE DETECTED NONE DETECTED   Benzodiazepines NONE DETECTED NONE DETECTED   Amphetamines NONE DETECTED NONE DETECTED   Tetrahydrocannabinol NONE DETECTED NONE DETECTED   Barbiturates NONE DETECTED NONE DETECTED    Comment:        DRUG  SCREEN FOR MEDICAL PURPOSES ONLY.  IF CONFIRMATION IS NEEDED FOR ANY PURPOSE, NOTIFY LAB WITHIN 5 DAYS.        LOWEST DETECTABLE LIMITS FOR URINE DRUG SCREEN Drug Class       Cutoff  (ng/mL) Amphetamine      1000 Barbiturate      200 Benzodiazepine   163 Tricyclics       845 Opiates          300 Cocaine          300 THC              50   I-Stat beta hCG blood, ED ([facility], [facility], [facility] only)     Status: None   Collection Time: 11/04/14  2:45 AM  Result Value Ref Range   I-stat hCG, quantitative <5.0 <5 mIU/mL   Comment 3            Comment:   GEST. AGE      CONC.  (mIU/mL)   <=1 WEEK        5 - 50     2 WEEKS       50 - 500     3 WEEKS       100 - 10,000     4 WEEKS     1,000 - 30,000        FEMALE AND NON-PREGNANT FEMALE:     LESS THAN 5 mIU/mL    Current Medications: Current Facility-Administered Medications  Medication Dose Route Frequency Provider Last Rate Last Dose  . ondansetron (ZOFRAN-ODT) disintegrating tablet 4 mg  4 mg Oral Q6H PRN Zenola Dezarn B Cameshia Cressman, NP   4 mg at 11/05/14 1048   PTA Medications: Prescriptions prior to admission  Medication Sig Dispense Refill Last Dose  . acetaminophen (TYLENOL) 500 MG tablet Take 500 mg by mouth every 6 (six) hours as needed for moderate pain.   Unknown at Unknown time  . famotidine (PEPCID) 20 MG tablet Take 1 tablet (20 mg total) by mouth 2 (two) times daily. (Patient not taking: Reported on 11/04/2014) 30 tablet 0 Unknown at Unknown time  . HYDROcodone-acetaminophen (HYCET) 7.5-325 mg/15 ml solution 35ml PO every 8 hours as needed (Patient not taking: Reported on 11/04/2014) 60 mL 0 Unknown at Unknown time  . naproxen (NAPROSYN) 500 MG tablet Take 1 tablet (500 mg total) by mouth 2 (two) times daily. (Patient not taking: Reported on 11/04/2014) 30 tablet 0 Unknown at Unknown time  . pantoprazole (PROTONIX) 20 MG tablet Take 1 tablet (20 mg total) by mouth daily. (Patient not taking: Reported on 11/04/2014) 30 tablet 0 Unknown at Unknown time  . ranitidine (ZANTAC) 150 MG tablet Take 1 tablet (150 mg total) by mouth 2 (two) times daily. 30 tablet 0     Previous Psychotropic Medications: Yes   Substance Abuse History in the  last 12 months:  Yes.      Consequences of Substance Abuse: Family Consequences:  Family discord Blackouts:    Results for orders placed or performed during the hospital encounter of 11/04/14 (from the past 72 hour(s))  CBG monitoring, ED     Status: None   Collection Time: 11/04/14  2:38 AM  Result Value Ref Range   Glucose-Capillary 80 65 - 99 mg/dL  Comprehensive metabolic panel     Status: Abnormal   Collection Time: 11/04/14  2:39 AM  Result Value Ref Range   Sodium 141 135 - 145 mmol/L   Potassium 3.7 3.5 -  5.1 mmol/L   Chloride 110 101 - 111 mmol/L   CO2 22 22 - 32 mmol/L   Glucose, Bld 91 65 - 99 mg/dL   BUN 13 6 - 20 mg/dL   Creatinine, Ser 0.63 0.44 - 1.00 mg/dL   Calcium 8.7 (L) 8.9 - 10.3 mg/dL   Total Protein 6.4 (L) 6.5 - 8.1 g/dL   Albumin 4.0 3.5 - 5.0 g/dL   AST 24 15 - 41 U/L   ALT 31 14 - 54 U/L   Alkaline Phosphatase 66 38 - 126 U/L   Total Bilirubin 0.3 0.3 - 1.2 mg/dL   GFR calc non Af Amer >60 >60 mL/min   GFR calc Af Amer >60 >60 mL/min    Comment: (NOTE) The eGFR has been calculated using the CKD EPI equation. This calculation has not been validated in all clinical situations. eGFR's persistently <60 mL/min signify possible Chronic Kidney Disease.    Anion gap 9 5 - 15  Ethanol (ETOH)     Status: Abnormal   Collection Time: 11/04/14  2:39 AM  Result Value Ref Range   Alcohol, Ethyl (B) 229 (H) <5 mg/dL    Comment:        LOWEST DETECTABLE LIMIT FOR SERUM ALCOHOL IS 5 mg/dL FOR MEDICAL PURPOSES ONLY   Salicylate level     Status: None   Collection Time: 11/04/14  2:39 AM  Result Value Ref Range   Salicylate Lvl <9.8 2.8 - 30.0 mg/dL  Acetaminophen level     Status: Abnormal   Collection Time: 11/04/14  2:39 AM  Result Value Ref Range   Acetaminophen (Tylenol), Serum <10 (L) 10 - 30 ug/mL    Comment:        THERAPEUTIC CONCENTRATIONS VARY SIGNIFICANTLY. A RANGE OF 10-30 ug/mL MAY BE AN EFFECTIVE CONCENTRATION FOR MANY  PATIENTS. HOWEVER, SOME ARE BEST TREATED AT CONCENTRATIONS OUTSIDE THIS RANGE. ACETAMINOPHEN CONCENTRATIONS >150 ug/mL AT 4 HOURS AFTER INGESTION AND >50 ug/mL AT 12 HOURS AFTER INGESTION ARE OFTEN ASSOCIATED WITH TOXIC REACTIONS.   CBC     Status: Abnormal   Collection Time: 11/04/14  2:39 AM  Result Value Ref Range   WBC 8.2 4.0 - 10.5 K/uL   RBC 4.33 3.87 - 5.11 MIL/uL   Hemoglobin 13.3 12.0 - 15.0 g/dL   HCT 39.2 36.0 - 46.0 %   MCV 90.5 78.0 - 100.0 fL   MCH 30.7 26.0 - 34.0 pg   MCHC 33.9 30.0 - 36.0 g/dL   RDW 13.3 11.5 - 15.5 %   Platelets 133 (L) 150 - 400 K/uL  Urine rapid drug screen (hosp performed) (Not at [facility])     Status: Abnormal   Collection Time: 11/04/14  2:42 AM  Result Value Ref Range   Opiates POSITIVE (A) NONE DETECTED   Cocaine NONE DETECTED NONE DETECTED   Benzodiazepines NONE DETECTED NONE DETECTED   Amphetamines NONE DETECTED NONE DETECTED   Tetrahydrocannabinol NONE DETECTED NONE DETECTED   Barbiturates NONE DETECTED NONE DETECTED    Comment:        DRUG SCREEN FOR MEDICAL PURPOSES ONLY.  IF CONFIRMATION IS NEEDED FOR ANY PURPOSE, NOTIFY LAB WITHIN 5 DAYS.        LOWEST DETECTABLE LIMITS FOR URINE DRUG SCREEN Drug Class       Cutoff (ng/mL) Amphetamine      1000 Barbiturate      200 Benzodiazepine   921 Tricyclics       194 Opiates  300 Cocaine          300 THC              50   I-Stat beta hCG blood, ED ([facility], [facility], [facility] only)     Status: None   Collection Time: 11/04/14  2:45 AM  Result Value Ref Range   I-stat hCG, quantitative <5.0 <5 mIU/mL   Comment 3            Comment:   GEST. AGE      CONC.  (mIU/mL)   <=1 WEEK        5 - 50     2 WEEKS       50 - 500     3 WEEKS       100 - 10,000     4 WEEKS     1,000 - 30,000        FEMALE AND NON-PREGNANT FEMALE:     LESS THAN 5 mIU/mL     Observation Level/Precautions:  15 minute checks  Laboratory:  CBC Chemistry Profile UDS UA  Psychotherapy:  Individual and group  session  Medications:  Medications will be started as appropriate for patient stabilization  Consultations:  Psychiatry  Discharge Concerns:  Safety, stabilization, and risk of access to medication and medication stabilization   Estimated LOS: 5-7 day   Other:     Psychological Evaluations: Yes   Treatment Plan Summary: Daily contact with patient to assess and evaluate symptoms and progress in treatment and Medication management  1. Admit for crisis management and stabilization 2. Medication management to reduce current symptoms to bale line and improve the patient's overall level of functioning 3. Treat health problems as indicated 4. Develop treatment plan to decrease risk of relapse upon discharge and the need for readmission. 5. Psycho-social education regarding relapse prevention and self care. 6. Health care follow up as needed for medical problems 7. Restart home medications where appropriate.    Patient wast started on:  Prozac 20 mg for Depression Dilantin ER 100 mg Tid for Seizure Trazodone 50 mg Q hs prn Sleep Ativan 2 mg IM Q 4 hr prn Seizure Ativan 1 mg Q 4 hr withdrawal protocol (CWIA) Zofran 4 mg Q 6 hr prn Nausea/Vomiting  Medical Decision Making:  Review of Psycho-Social Stressors (1), Review or order clinical lab tests (1), Review and summation of old records (2), Review of Last Therapy Session (1), Independent Review of image, tracing or specimen (2) and Review of Medication Regimen & Side Effects (2)  I certify that inpatient services furnished can reasonably be expected to improve the patient's condition.   Ruthanna Macchia, FNP-BC 8/20/201611:14 AM

## 2014-11-05 NOTE — ED Notes (Signed)
Pt reports when asked, "Yes I want to hurt myself and I want to die." Pt is calm and cooperative

## 2014-11-05 NOTE — [facility] Suicide Risk Assessment (Signed)
[facility] Admission Suicide Risk Assessment   Nursing information obtained from:  Patient Demographic factors:  Caucasian Current Mental Status:  Suicidal ideation indicated by others, NA Loss Factors:  NA Historical Factors:  Prior suicide attempts Risk Reduction Factors:  Responsible for children under 39 years of age Total Time spent with patient: 30 minutes Principal Problem: MDD (major depressive disorder), recurrent severe, without psychosis Diagnosis:   Patient Active Problem List   Diagnosis Date Noted  . Panic disorder [F41.0] 11/05/2014  . MDD (major depressive disorder), recurrent severe, without psychosis [F33.2]      Continued Clinical Symptoms:  Alcohol Use Disorder Identification Test Final Score (AUDIT): 0 The "Alcohol Use Disorders Identification Test", Guidelines for Use in Primary Care, Second Edition.  World Science writer [facility]). Score between 0-7:  no or low risk or alcohol related problems. Score between 8-15:  moderate risk of alcohol related problems. Score between 16-19:  high risk of alcohol related problems. Score 20 or above:  warrants further diagnostic evaluation for alcohol dependence and treatment.   CLINICAL FACTORS:   Alcohol/Substance Abuse/Dependencies Previous Psychiatric Diagnoses and Treatments   Musculoskeletal: Strength & Muscle Tone: within normal limits Gait & Station: normal Patient leans: N/A  Psychiatric Specialty Exam: Physical Exam  Review of Systems  Psychiatric/Behavioral: Positive for depression, suicidal ideas and substance abuse. The patient is nervous/anxious.   All other systems reviewed and are negative.   Blood pressure 105/65, pulse 57, temperature 99.3 F (37.4 C), temperature source Oral, resp. rate 18, height 5\' 3"  (1.6 m), weight 58.968 kg (130 lb).Body mass index is 23.03 kg/(m^2).  General Appearance: Disheveled  Eye Solicitor::  Fair  Speech:  Normal Rate  Volume:  Decreased  Mood:  Anxious and Depressed   Affect:  Constricted  Thought Process:  Coherent and Linear  Orientation:  Full (Time, Place, and Person)  Thought Content:  Rumination  Suicidal Thoughts:  Yes.  without intent/plan  Homicidal Thoughts:  No  Memory:  Immediate;   Fair Recent;   Fair Remote;   Fair  Judgement:  Impaired  Insight:  Lacking  Psychomotor Activity:  Decreased  Concentration:  Poor  Recall:  [employer] of Knowledge:Fair  Language: Fair  Akathisia:  No  Handed:  Right  AIMS (if indicated):     Assets:  Physical Health  Sleep:     Cognition: WNL  ADL's:  Intact     COGNITIVE FEATURES THAT CONTRIBUTE TO RISK:  Closed-mindedness, Polarized thinking and Thought constriction (tunnel vision)    SUICIDE RISK:   Severe:  Frequent, intense, and enduring suicidal ideation, specific plan, no subjective intent, but some objective markers of intent (i.e., choice of lethal method), the method is accessible, some limited preparatory behavior, evidence of impaired self-control, severe dysphoria/symptomatology, multiple risk factors present, and few if any protective factors, particularly a lack of social support.  PLAN OF CARE: Patient will benefit from inpatient treatment and stabilization.  Estimated length of stay is 5-7 days.  Reviewed past medical records,treatment plan.  Patient had one admission at [facility] for alcohol dependence - but reports she does not remember it. Pt currently denies abusing alcohol and states that it was a one time use. Pt denies any withdrawal sx. Pt tried to OD on her friend's Morphine since she is depressed and homeless. Will start a trial of Prozac 20 mg po daily for affective sx. She has been tried on Zoloft, effexor,celexa,lexapro,cymbalta,paxil in the past. Not been tried on Remeron, prozac, luvox. Will start  CIWA Protocol as well as Ativan 1 mg po prn for withdrawal sx. Pt also with hx of seizures - will start Dilantin ER 100 mg po tid . Seizure precaution, falls  precautions. Ativan 2 mg IM prn for seizures. Will continue to monitor vitals ,medication compliance and treatment side effects while patient is here.  Will monitor for medical issues as well as call consult as needed.  Reviewed labs CBC- platelets -low - will repeat , CMP reviewed- BAL >200 , UDS -pos -opiates  Will get TSH. CSW will start working on disposition.  Patient to participate in therapeutic milieu .       Medical Decision Making:  Review or order clinical lab tests (1), Discuss test with performing physician (1), Review and summation of old records (2), New Problem, with no additional work-up planned (3), Review of Last Therapy Session (1), Review of Medication Regimen & Side Effects (2) and Review of New Medication or Change in Dosage (2)  I certify that inpatient services furnished can reasonably be expected to improve the patient's condition.   Tige Meas md 11/05/2014, 11:48 AM

## 2014-11-05 NOTE — Progress Notes (Signed)
Patient ID: Harvest Deist, female   DOB: 07-10-1975, 39 y.o.   MRN: [id number]   39 year old white female admitted after she overdosed on Morphine and Alcohol. Pt was taken to [facility], where she was given Narcan and Charcoal. It was reported that patient took Morphine pills and drank alcohol in an attempt to die. It was reported that the patient was begging to die. It was reported that the patients BAL was 229 and that she was positive for opiates. It was also reported that the patients boyfriend was one of her major stressers and that they got into a verbal altercation in the ED last night where the boyfriend was asked to leave. At time of admission patient was very confused and could not remember why she was admitted to [facility]. Pt could only remember who she was, but not what day it was or where she was. Pt reported that she was very sick, after about five minutes patient started throwing up. Pt reported that maybe she was admitted to the hospital because she wants to die, however reported that she was negative SI/HI no AH/VH noted. Pt reported that her depression was a 10, and her hopelessness was a 10. Pt reported that that she did not drink anything nor did she take any pills. Pt reported that she has no idea where that information came from. No was taken to the unit, and assisted to bed.

## 2014-11-05 NOTE — Progress Notes (Signed)
Did not attend group 

## 2014-11-05 NOTE — ED Notes (Signed)
Pt has been calm and cooperative, A&O x 4 and easily awaken by voice this shift.  Respirations are even and unlabored and pt is in NAD.  Sitter has been at bedside the entire shift.

## 2014-11-05 NOTE — [facility] Counselor (Signed)
Per Carie, RN, pt has been medically cleared.  Pt has been accepted to [facility] bed 406-2 under Dr Elna Breslow.  Voluntary paperwork signed by pt with this Clinical research associate as a witness. Papers then faxed to [facility] at [phone].  Pt can arrive to [facility] after 8:00 am.    Cyndie Mull, [facility] Triage Specialist

## 2014-11-05 NOTE — Tx Team (Addendum)
Initial Interdisciplinary Treatment Plan   PATIENT STRESSORS: Marital or family conflict Medication change or noncompliance   PATIENT STRENGTHS: Ability for insight Average or above average intelligence   PROBLEM LIST: Problem List/Patient Goals Date to be addressed Date deferred Reason deferred Estimated date of resolution  depression 11/05/14     SI                  " I don't know what I'm going to do, I'm homeless."                               DISCHARGE CRITERIA:  Improved stabilization in mood, thinking, and/or behavior Need for constant or close observation no longer present  PRELIMINARY DISCHARGE PLAN: Return to previous living arrangement  PATIENT/FAMIILY INVOLVEMENT: This treatment plan has been presented to and reviewed with the patient, Katherine Bond, and/or family member.  The patient and family have been given the opportunity to ask questions and make suggestions.  [name] [name] 11/05/2014, 9:39 AM

## 2014-11-05 NOTE — Progress Notes (Addendum)
D: Pt visible in milieu at present, observed on telephone. Pt has been cooperative with care and compliant with current treatment regimen. Pt went off unit with peer for dinner. A: Scheduled medications and PRNs--Vistaril, Tylenol and Zofran administered as ordered for c/o anxiety, pain and nausea. Emotional support and availability offered. Q 15 minutes checks maintained without self injurious behavior or outburst to note thus far. Fall and seizures precautions maintained without event.  Pt encouraged to voice needs. R: Pt safe on and off unit. Plan of care continues.

## 2014-11-05 NOTE — Progress Notes (Signed)
Wrier entered patients room and observed her lying in bed resting, she reports that she feels better than she did earlier. Writer encouraged her to drink plenty of fluids since report was given that her bp has been running low. Writer filled her pitcher with gatorade and she requested prns for anxiety and sleep. She denies si/hia/v/ hallucinations. She currently does not have a plan for discharge and reports that she is homeless b/c her roommate cheated her out of her money. Writer encouraged her to stay up in the dayroom and watch tv if possible so that she would be able to sleep tonight. Support given, safety maintained on unit with 15 min checks.

## 2014-11-06 LAB — CBC WITH DIFFERENTIAL/PLATELET
BASOS ABS: 0 10*3/uL (ref 0.0–0.1)
BASOS PCT: 0 % (ref 0–1)
EOS ABS: 0.2 10*3/uL (ref 0.0–0.7)
EOS PCT: 3 % (ref 0–5)
HCT: 42.4 % (ref 36.0–46.0)
Hemoglobin: 13.9 g/dL (ref 12.0–15.0)
Lymphocytes Relative: 33 % (ref 12–46)
Lymphs Abs: 2.1 10*3/uL (ref 0.7–4.0)
MCH: 30.3 pg (ref 26.0–34.0)
MCHC: 32.8 g/dL (ref 30.0–36.0)
MCV: 92.6 fL (ref 78.0–100.0)
MONO ABS: 0.5 10*3/uL (ref 0.1–1.0)
MONOS PCT: 7 % (ref 3–12)
NEUTROS ABS: 3.7 10*3/uL (ref 1.7–7.7)
Neutrophils Relative %: 57 % (ref 43–77)
PLATELETS: 157 10*3/uL (ref 150–400)
RBC: 4.58 MIL/uL (ref 3.87–5.11)
RDW: 13 % (ref 11.5–15.5)
WBC: 6.5 10*3/uL (ref 4.0–10.5)

## 2014-11-06 LAB — TSH: TSH: 2.323 u[IU]/mL (ref 0.350–4.500)

## 2014-11-06 MED ORDER — HYDROXYZINE HCL 50 MG PO TABS
50.0000 mg | ORAL_TABLET | Freq: Three times a day (TID) | ORAL | Status: DC | PRN
  Administered 2014-11-06 – 2014-11-14 (×11): 50 mg via ORAL
  Filled 2014-11-06 (×9): qty 1
  Filled 2014-11-06: qty 10
  Filled 2014-11-06 (×2): qty 1

## 2014-11-06 NOTE — Progress Notes (Signed)
[facility] MD Progress Note  11/06/2014 4:37 PM Katherine Bond  MRN:  [id number]   Subjective:  Patient laying in bed.  Patient states that she continues to have suicidal thoughts and wants to end her life "I don't want to live; just hopeless."  Patient states that she has been able to eat today with out vomiting.  She has went to the cafeteria for meals; states that she is still weak but is no longer dizzy like she was yesterday.  Patient rates her depression 8-9/10 and her anxiety 7/10.  Objective::  Patient seen, chart reviewed, and discussed with staff.  Patient is tolerating medications without adverse effect.  Patient has remained in bed most of the day and has not attended any group sessions.  Patient continues to endorse suicidal thoughts but is able to contract for safety on unit.  There has been no improvement in depression or anxiety.    Principal Problem: MDD (major depressive disorder), recurrent severe, without psychosis Diagnosis:   Patient Active Problem List   Diagnosis Date Noted  . Panic disorder [F41.0] 11/05/2014  . Convulsion [R56.9]   . MDD (major depressive disorder), recurrent severe, without psychosis [F33.2]    Total Time spent with patient: 45 minutes   Past Medical History:  Past Medical History  Diagnosis Date  . Bipolar 1 disorder     Past Surgical History  Procedure Laterality Date  . Pancreaticoduodenectomy    . Cholecystectomy    . Abdominal hysterectomy      partial   . Dental surgery     Family History: History reviewed. No pertinent family history. Social History:  History  Alcohol Use  . Yes    Comment: 5th of vodka in two days     History  Drug Use No    Social History   Social History  . Marital Status: Divorced    Spouse Name: N/A  . Number of Children: N/A  . Years of Education: N/A   Social History Main Topics  . Smoking status: Current Every Day Smoker -- 1.00 packs/day for 15 years    Types: Cigarettes  . Smokeless tobacco: None   . Alcohol Use: Yes     Comment: 5th of vodka in two days  . Drug Use: No  . Sexual Activity: Yes   Other Topics Concern  . None   Social History Narrative   Additional History:    Sleep: Fair  Appetite:  Fair   Assessment:   Some physical improvement in patient since she is able to stand without falling and able to hold food down today.  Patient is able to contract for safety and is not crying so somewhat more stable than yesterday.    Musculoskeletal: Strength & Muscle Tone: within normal limits Gait & Station: normal Patient leans: N/A   Psychiatric Specialty Exam: Physical Exam  Constitutional: She is oriented to person, place, and time.  Neck: Normal range of motion.  Respiratory: Effort normal.  Musculoskeletal: Normal range of motion.  Neurological: She is alert and oriented to person, place, and time.  Psychiatric: Her speech is normal and behavior is normal. Cognition and memory are normal. She expresses impulsivity. She exhibits a depressed mood. She expresses suicidal ideation.    Review of Systems  Constitutional: Positive for malaise/fatigue.  Gastrointestinal: Negative for nausea, vomiting, abdominal pain and diarrhea.  Neurological: Positive for seizures (history) and weakness. Negative for dizziness and tremors.  Psychiatric/Behavioral: Positive for depression, suicidal ideas and substance abuse. The patient  is nervous/anxious.   All other systems reviewed and are negative.   Blood pressure 108/70, pulse 60, temperature 98.4 F (36.9 C), temperature source Oral, resp. rate 16, height  (1.6 m), weight 58.968 kg (130 lb).Body mass index is 23.03 kg/(m^2).  General Appearance: Casual  Eye Contact::  Fair  Speech:  Clear and Coherent  Volume:  Decreased  Mood:  Depressed, Hopeless and Worthless  Affect:  Depressed and Flat  Thought Process:  Linear  Orientation:  Full (Time, Place, and Person)  Thought Content:  Denies hallucinations, delusions,  and paranoia  Suicidal Thoughts:  Yes.  with intent/plan, Also post suicide attempt (overdose)  Homicidal Thoughts:  No  Memory:  Immediate;   Fair Recent;   Fair Remote;   Fair  Judgement:  Impaired  Insight:  Lacking and Shallow  Psychomotor Activity:  Decreased  Concentration:  Fair  Recall:  [employer] of Knowledge:Fair  Language: Good  Akathisia:  Negative  Handed:  Right  AIMS (if indicated):     Assets:  Communication Skills  ADL's:  Intact  Cognition: WNL  Sleep:  Number of Hours: 6.75     Current Medications: Current Facility-Administered Medications  Medication Dose Route Frequency Provider Last Rate Last Dose  . acetaminophen (TYLENOL) tablet 650 mg  650 mg Oral Q6H PRN Jomarie Longs, MD   650 mg at 11/05/14 1248  . alum & mag hydroxide-simeth (MAALOX/MYLANTA) 200-200-20 MG/5ML suspension 30 mL  30 mL Oral Q4H PRN Saramma Eappen, MD      . FLUoxetine (PROZAC) capsule 20 mg  20 mg Oral Daily Saramma Eappen, MD   20 mg at 11/06/14 0826  . hydrOXYzine (ATARAX/VISTARIL) tablet 25 mg  25 mg Oral TID PRN Jomarie Longs, MD   25 mg at 11/05/14 2130  . LORazepam (ATIVAN) injection 2 mg  2 mg Intramuscular Q4H PRN Jomarie Longs, MD      . LORazepam (ATIVAN) tablet 1 mg  1 mg Oral Q4H PRN Jomarie Longs, MD   1 mg at 11/06/14 1606  . magnesium hydroxide (MILK OF MAGNESIA) suspension 30 mL  30 mL Oral Daily PRN Jomarie Longs, MD      . ondansetron (ZOFRAN-ODT) disintegrating tablet 4 mg  4 mg Oral Q6H PRN Shuvon B Rankin, NP   4 mg at 11/05/14 1048  . phenytoin (DILANTIN) ER capsule 100 mg  100 mg Oral TID Jomarie Longs, MD   100 mg at 11/06/14 1301  . traZODone (DESYREL) tablet 50 mg  50 mg Oral QHS PRN Jomarie Longs, MD   50 mg at 11/05/14 2130    Lab Results:  Results for orders placed or performed during the hospital encounter of 11/05/14 (from the past 48 hour(s))  CBC with Differential/Platelet     Status: None   Collection Time: 11/06/14  6:46 AM  Result Value  Ref Range   WBC 6.5 4.0 - 10.5 K/uL   RBC 4.58 3.87 - 5.11 MIL/uL   Hemoglobin 13.9 12.0 - 15.0 g/dL   HCT 91.4 78.2 - 95.6 %   MCV 92.6 78.0 - 100.0 fL   MCH 30.3 26.0 - 34.0 pg   MCHC 32.8 30.0 - 36.0 g/dL   RDW 21.3 08.6 - 57.8 %   Platelets 157 150 - 400 K/uL   Neutrophils Relative % 57 43 - 77 %   Neutro Abs 3.7 1.7 - 7.7 K/uL   Lymphocytes Relative 33 12 - 46 %   Lymphs Abs 2.1 0.7 - 4.0  K/uL   Monocytes Relative 7 3 - 12 %   Monocytes Absolute 0.5 0.1 - 1.0 K/uL   Eosinophils Relative 3 0 - 5 %   Eosinophils Absolute 0.2 0.0 - 0.7 K/uL   Basophils Relative 0 0 - 1 %   Basophils Absolute 0.0 0.0 - 0.1 K/uL    Comment: Performed at [facility]  TSH     Status: None   Collection Time: 11/06/14  6:46 AM  Result Value Ref Range   TSH 2.323 0.350 - 4.500 uIU/mL    Comment: Performed at [facility]    Physical Findings: AIMS: Facial and Oral Movements Muscles of Facial Expression: None, normal Lips and Perioral Area: None, normal Jaw: None, normal Tongue: None, normal,Extremity Movements Upper (arms, wrists, hands, fingers): None, normal Lower (legs, knees, ankles, toes): None, normal, Trunk Movements Neck, shoulders, hips: None, normal, Overall Severity Severity of abnormal movements (highest score from questions above): None, normal Incapacitation due to abnormal movements: None, normal Patient's awareness of abnormal movements (rate only patient's report): No Awareness, Dental Status Current problems with teeth and/or dentures?: No Does patient usually wear dentures?: No  CIWA:  CIWA-Ar Total: 12 COWS:  COWS Total Score: 8  Treatment Plan Summary: Daily contact with patient to assess and evaluate symptoms and progress in treatment and Medication management  PLAN OF CARE:  Patient will benefit from inpatient treatment and stabilization.  Estimated length of stay is 5-7 days.  Reviewed past medical records,treatment plan.   Patient had one admission at [facility] for alcohol dependence - but reports she does not remember it. Pt currently denies abusing alcohol and states that it was a one time use. Pt denies any withdrawal sx. Pt tried to OD on her friend's Morphine since she is depressed and homeless. Will start a trial of Prozac 20 mg po daily for affective sx. She has been tried on Zoloft, effexor,celexa,lexapro,cymbalta,paxil in the past. Not been tried on Remeron, prozac, luvox. Will start CIWA Protocol as well as Ativan 1 mg po prn for withdrawal sx. Pt also with hx of seizures - will start Dilantin ER 100 mg po tid . Seizure precaution, falls precautions. Ativan 2 mg IM prn for seizures. Will continue to monitor vitals ,medication compliance and treatment side effects while patient is here.  Will monitor for medical issues as well as call consult as needed.  Reviewed labs CBC- platelets -low - will repeat , CMP reviewed- BAL >200 , UDS -pos -opiates Will get TSH. CSW will start working on disposition.  Patient to participate in therapeutic milieu .   Will continue with current plan of care; no changes at this time.  Medical Decision Making:  Review of Psycho-Social Stressors (1), Review of Last Therapy Session (1) and Independent Review of image, tracing or specimen (2)   Rankin, Shuvon, FNP-BC 11/06/2014, 4:37 PM

## 2014-11-06 NOTE — Progress Notes (Signed)
D)VS. 108/71, 60  Pt. Reports increasing agitation, malaise, tremor, and now reports HA, and nausea/cramping.  Pt. Appears visibly more anxious and worried. Pt. States she is feeling passively suicidal and stated "there's no way to do it here".   A) Medicated and offered emotional support.   R) pt. Receptive.

## 2014-11-06 NOTE — Plan of Care (Addendum)
Problem: Diagnosis: Increased Risk For Suicide Attempt Goal: STG-Patient Will Attend All Groups On The Unit Outcome: Not Progressing Patient did not attend wrap up group.

## 2014-11-06 NOTE — Progress Notes (Signed)
Adult Psychoeducational Group Note  Date:  11/06/2014 Time:  3:18 PM  Group Topic/Focus:  Coping With Mental Health Crisis:   The purpose of this group is to help patients identify strategies for coping with mental health crisis.  Group discusses possible causes of crisis and ways to manage them effectively.  Participation Level:  Did Not Attend  Participation Quality:  n/a, not in attendance  Affect:n/a   Cognitive:  N/a Insight: None  Engagement in Group:  None  Modes of Intervention:  n/a  did not attend  Additional Comments:  N/a   Katherine Bond 11/06/2014, 3:18 PM

## 2014-11-06 NOTE — [facility] Group Notes (Signed)
[facility] Group Notes:  (Clinical Social Work)  11/06/2014  1:15-2:15PM  Summary of Progress/Problems:   The main focus of today's process group was to   1)  discuss the importance of adding supports  2)  define health supports versus unhealthy supports  3)  identify the patient's current unhealthy supports and plan how to handle them  4)  Identify the patient's current healthy supports and plan what to add.  An emphasis was placed on using counselor, doctor, therapy groups, 12-step groups, and problem-specific support groups to expand supports.     The patient stated that she did not want to talk today, only wanted to listen, but did not appear to listen, rather appeared drowsy.  Type of Therapy:  Process Group with Motivational Interviewing  Participation Level:  Minimal  Participation Quality:  Drowsy  Affect:  Depressed and Flat  Cognitive:  N/A  Insight:  None  Engagement in Therapy:  None  Modes of Intervention:   Education, Support and Processing, Activity  Ambrose Mantle, LCSW 11/06/2014

## 2014-11-06 NOTE — Progress Notes (Signed)
D)pt. Affect blunted and slightly anxious and angry.  Pt. Resistant to interact during morning, but expressed more in the way of issues during afternoon medication pass.  Pt. Stated she feels like she has "nothing".  Pt. Further stated the relationship with her boyfriend is the only thing positive in her life.  Pt. Shared her concerns for her court date related to embezzlement from [employer], and also shared " I lost my home, I lost my job and I lost my kids".  Pt. Stated her ex-husband married 3 weeks after their divorce went through.  Pt. Abruptly ended the conversation and stated "I gotta go lay down".  A) Support offered.  Pt. Encouraged to attend groups and medications reviewed. R) Pt. Receptive, but abrupt at times. Continues on q 15 min. Observations and is safe at this time.

## 2014-11-06 NOTE — Progress Notes (Signed)
Did not attend group, remained in room resting. 

## 2014-11-06 NOTE — [facility] Counselor (Signed)
Today pt was unwilling to answer questions for Psychosocial Assessment or sign paperwork, remains suicidal. Ambrose Mantle, LCSW 11/06/2014, 5:32 PM

## 2014-11-07 DIAGNOSIS — F332 Major depressive disorder, recurrent severe without psychotic features: Principal | ICD-10-CM

## 2014-11-07 MED ORDER — DULOXETINE HCL 30 MG PO CPEP
30.0000 mg | ORAL_CAPSULE | Freq: Every day | ORAL | Status: DC
  Administered 2014-11-08: 30 mg via ORAL
  Filled 2014-11-07 (×3): qty 1

## 2014-11-07 NOTE — Plan of Care (Signed)
Problem: Diagnosis: Increased Risk For Suicide Attempt Goal: LTG-Patient Will Report Improved Mood and Deny Suicidal LTG (by discharge) Patient will report improved mood and deny suicidal ideation.  Outcome: Not Progressing Patient reports felling suicidal and verbally contracts for safety. She reports that all she wants to do is go to sleep and die.

## 2014-11-07 NOTE — Progress Notes (Signed)
Patient ID: Katherine Bond, female   DOB: 03-May-1975, 39 y.o.   MRN: [id number] [facility] MD Progress Note  11/07/2014 4:05 PM Katherine Bond  MRN:  [id number]   Subjective:   Patient reports ongoing depression, sadness, and remains ruminative about stressors- she describes multiple stressors, to include being divorced, not seeing her children often, who live with their father, recent loss of job, and being charged with embezzlement . She states she has a long history of depression and that in general she has responded well to antidepressants in the past, although " they seem to stop working after a while". She states, however, that current Prozac trial " does not seem to be working at all for me , in fact I think it is making me feel worse ".  She is denying suicidal plan or intent and contracts for safety, but states she still feels depressed , with " not a lot to live for ".   Objective::  Patient seen, chart reviewed, and discussed with staff.  Patient remains isolative, tending to stay in her room, keep to self. Presents vaguely irritable upon approach. No agitated or self injurious behaviors. She is currently focused on medication issues and states Prozac not helpful or well tolerated . We discussed other treatment options, to include augmentation strategies, but prefers to change antidepressants at present. Interested in Cymbalta trial, we discussed side effect profile and she is aware of therapeutic lag associated with antidepressants. Describes a history of alcohol abuse, stopped several years ago. States she occasionally uses opiates, but denies pattern of dependence. States she used morphine because " I knew I was allergic ". Does not endorse pattern of abuse or dependence that would cause withdrawal symptoms upon discontinuation.  Affect improves partially with support, empathy. CBC unremarkable, TSH WNL.    Principal Problem: MDD (major depressive disorder), recurrent severe, without  psychosis Diagnosis:   Patient Active Problem List   Diagnosis Date Noted  . Panic disorder [F41.0] 11/05/2014  . Convulsion [R56.9]   . MDD (major depressive disorder), recurrent severe, without psychosis [F33.2]    Total Time spent with patient: 20 minutes   Past Medical History:  Past Medical History  Diagnosis Date  . Bipolar 1 disorder     Past Surgical History  Procedure Laterality Date  . Pancreaticoduodenectomy    . Cholecystectomy    . Abdominal hysterectomy      partial   . Dental surgery     Family History: History reviewed. No pertinent family history. Social History:  History  Alcohol Use  . Yes    Comment: 5th of vodka in two days     History  Drug Use No    Social History   Social History  . Marital Status: Divorced    Spouse Name: N/A  . Number of Children: N/A  . Years of Education: N/A   Social History Main Topics  . Smoking status: Current Every Day Smoker -- 1.00 packs/day for 15 years    Types: Cigarettes  . Smokeless tobacco: None  . Alcohol Use: Yes     Comment: 5th of vodka in two days  . Drug Use: No  . Sexual Activity: Yes   Other Topics Concern  . None   Social History Narrative   Additional History:    Sleep:  Improved   Appetite:  Fair   Assessment:   Some physical improvement in patient since she is able to stand without falling and able to hold food down today.  Patient is able to contract for safety and is not crying so somewhat more stable than yesterday.    Musculoskeletal: Strength & Muscle Tone: within normal limits Gait & Station: normal Patient leans: N/A   Psychiatric Specialty Exam: Physical Exam  Constitutional: She is oriented to person, place, and time.  Neck: Normal range of motion.  Respiratory: Effort normal.  Musculoskeletal: Normal range of motion.  Neurological: She is alert and oriented to person, place, and time.  Psychiatric: Her speech is normal and behavior is normal. Cognition and  memory are normal. She expresses impulsivity. She exhibits a depressed mood. She expresses suicidal ideation.    Review of Systems  Constitutional: Positive for malaise/fatigue.  Gastrointestinal: Negative for nausea, vomiting, abdominal pain and diarrhea.  Neurological: Positive for seizures (history) and weakness. Negative for dizziness and tremors.  Psychiatric/Behavioral: Positive for depression, suicidal ideas and substance abuse. The patient is nervous/anxious.   All other systems reviewed and are negative. complains of constipation, denies nausea or vomiting today  Blood pressure 110/68, pulse 59, temperature 98.6 F (37 C), temperature source Oral, resp. rate 16, height  (1.6 m), weight 130 lb (58.968 kg).Body mass index is 23.03 kg/(m^2).  General Appearance: Fairly Groomed  Patent attorney::  Fair  Speech:  Clear and Coherent  Volume:  Decreased  Mood:  Depressed  Affect:  Constricted and Depressed, subtly irritable   Thought Process:  Linear  Orientation:  Full (Time, Place, and Person)  Thought Content:  Denies hallucinations, delusions, and paranoia- does not appear internally preoccupied   Suicidal Thoughts:  Passive SI-, denies plan or intention of hurting self or of SI on unit, contracts for safety at this time  Homicidal Thoughts:  No  Memory:  Immediate;   Fair Recent;   Fair Remote;   Fair  Judgement:  Fair  Insight:  Fair and Shallow  Psychomotor Activity:  Decreased  Concentration:  Fair  Recall:  [employer] of Knowledge:Fair  Language: Good  Akathisia:  Negative  Handed:  Right  AIMS (if indicated):     Assets:  Communication Skills  ADL's:  Intact  Cognition: WNL  Sleep:  Number of Hours: 6.75     Current Medications: Current Facility-Administered Medications  Medication Dose Route Frequency Provider Last Rate Last Dose  . acetaminophen (TYLENOL) tablet 650 mg  650 mg Oral Q6H PRN Jomarie Longs, MD   650 mg at 11/05/14 1248  . alum & mag  hydroxide-simeth (MAALOX/MYLANTA) 200-200-20 MG/5ML suspension 30 mL  30 mL Oral Q4H PRN Saramma Eappen, MD      . FLUoxetine (PROZAC) capsule 20 mg  20 mg Oral Daily Saramma Eappen, MD   20 mg at 11/07/14 0810  . hydrOXYzine (ATARAX/VISTARIL) tablet 50 mg  50 mg Oral TID PRN Worthy Flank, NP   50 mg at 11/06/14 2119  . LORazepam (ATIVAN) injection 2 mg  2 mg Intramuscular Q4H PRN Jomarie Longs, MD      . LORazepam (ATIVAN) tablet 1 mg  1 mg Oral Q4H PRN Jomarie Longs, MD   1 mg at 11/06/14 1606  . magnesium hydroxide (MILK OF MAGNESIA) suspension 30 mL  30 mL Oral Daily PRN Jomarie Longs, MD   30 mL at 11/07/14 1105  . ondansetron (ZOFRAN-ODT) disintegrating tablet 4 mg  4 mg Oral Q6H PRN Shuvon B Rankin, NP   4 mg at 11/07/14 1054  . phenytoin (DILANTIN) ER capsule 100 mg  100 mg Oral TID Jomarie Longs, MD  100 mg at 11/07/14 1105  . traZODone (DESYREL) tablet 50 mg  50 mg Oral QHS PRN Jomarie Longs, MD   50 mg at 11/06/14 2119    Lab Results:  Results for orders placed or performed during the hospital encounter of 11/05/14 (from the past 48 hour(s))  CBC with Differential/Platelet     Status: None   Collection Time: 11/06/14  6:46 AM  Result Value Ref Range   WBC 6.5 4.0 - 10.5 K/uL   RBC 4.58 3.87 - 5.11 MIL/uL   Hemoglobin 13.9 12.0 - 15.0 g/dL   HCT 16.1 09.6 - 04.5 %   MCV 92.6 78.0 - 100.0 fL   MCH 30.3 26.0 - 34.0 pg   MCHC 32.8 30.0 - 36.0 g/dL   RDW 40.9 81.1 - 91.4 %   Platelets 157 150 - 400 K/uL   Neutrophils Relative % 57 43 - 77 %   Neutro Abs 3.7 1.7 - 7.7 K/uL   Lymphocytes Relative 33 12 - 46 %   Lymphs Abs 2.1 0.7 - 4.0 K/uL   Monocytes Relative 7 3 - 12 %   Monocytes Absolute 0.5 0.1 - 1.0 K/uL   Eosinophils Relative 3 0 - 5 %   Eosinophils Absolute 0.2 0.0 - 0.7 K/uL   Basophils Relative 0 0 - 1 %   Basophils Absolute 0.0 0.0 - 0.1 K/uL    Comment: Performed at [facility]  TSH     Status: None   Collection Time: 11/06/14  6:46 AM   Result Value Ref Range   TSH 2.323 0.350 - 4.500 uIU/mL    Comment: Performed at [facility]    Physical Findings: AIMS: Facial and Oral Movements Muscles of Facial Expression: None, normal Lips and Perioral Area: None, normal Jaw: None, normal Tongue: None, normal,Extremity Movements Upper (arms, wrists, hands, fingers): None, normal Lower (legs, knees, ankles, toes): None, normal, Trunk Movements Neck, shoulders, hips: None, normal, Overall Severity Severity of abnormal movements (highest score from questions above): None, normal Incapacitation due to abnormal movements: None, normal Patient's awareness of abnormal movements (rate only patient's report): No Awareness, Dental Status Current problems with teeth and/or dentures?: No Does patient usually wear dentures?: No  CIWA:  CIWA-Ar Total: 4 COWS:  COWS Total Score: 8   Assessment- patient presents depressed, dysphoric,vaguely irritable. Passive thoughts of death, but no active SI at this time and contracting for safety. She reports that Prozac not helpful and possibly detrimental, although does not specify any concrete side effect. She wants to try another antidepressant . We discussed options.  She has been on different antidepressants in the past, with initial  But time limited response. Agrees to Cymbalta trial- side effects discussed .   Treatment Plan Summary: Daily contact with patient to assess and evaluate symptoms and progress in treatment and Medication management  PLAN OF CARE:  Continue inpatient treatment. Continue to encourage group /milieu participation.  D/C Prozac . Start Cymbalta 30 mgrs QDAY initially for depression and anxiety.  Continue Hydroxyzine for management of Anxiety as needed . Continue  Ativan PRNs as per CIWA protocol, to minimize risk of WDL. -(  patient admission BAL 229 )   Continue Dilantin ER 100 mg  TID for history of seizures  Obtain Dilantin Serum level . Continue  Trazodone 50 mgrs QHS PRN Insomnia . CSW will start working on disposition.  Patient to participate in therapeutic milieu .   Will continue with current plan of care;  no changes at this time.  Medical Decision Making:  Review of Psycho-Social Stressors (1), Review of Last Therapy Session (1) and Independent Review of image, tracing or specimen (2)   Nehemiah Massed,  MD  11/07/2014, 4:05 PM

## 2014-11-07 NOTE — Progress Notes (Signed)
Recreation Therapy Notes  Date: 08.22.2016 Time: 9:30am Location: [address] Dayroom   Group Topic: Stress Management  Goal Area(s) Addresses:  Patient will actively participate in stress management techniques presented during session.   Behavioral Response: Did not attend.   Marykay Lex Artesia Berkey, LRT/CTRS  Chrysa Rampy L 11/07/2014 1:49 PM

## 2014-11-07 NOTE — [facility] Group Notes (Signed)
[facility] LCSW Group Therapy  11/07/2014 1:15pm  Type of Therapy:  Group Therapy vercoming Obstacles  Pt did not attend, declined invitation.   Iva Montelongo Carter, LCSWA 11/07/2014 5:22 PM  

## 2014-11-07 NOTE — Plan of Care (Signed)
Problem: Ineffective individual coping Goal: STG-Increase in ability to manage activities of daily living Outcome: Not Progressing Pt has been in bed all morning with si thoughts, depression and hopelessness.  Problem: Diagnosis: Increased Risk For Suicide Attempt Goal: LTG-Patient Will Report Improved Mood and Deny Suicidal LTG (by discharge) Patient will report improved mood and deny suicidal ideation.  Outcome: Not Progressing Pt continues to report si thoughts

## 2014-11-07 NOTE — [facility] Group Notes (Signed)
[facility] LCSW Aftercare Discharge Planning Group Note  11/07/2014 8:45 AM  Pt did not attend, declined invitation.   Chad Cordial, LCSWA 11/07/2014 9:39 AM

## 2014-11-07 NOTE — Progress Notes (Signed)
CSW attempted to meet with Pt to complete PSA; however, Pt states that she feels nauseous when she gets out of bed and requested that PSA be completed at another time. CSW will continue to attempt PSA.  Chad Cordial, LCSWA Clinical Social Work [phone]

## 2014-11-07 NOTE — Progress Notes (Signed)
Writer entered patients room and observed her lying in bed resting. She did not attend group this evening and reports to writer that she felt even more anxious after taking a prn ativan. She reports that she feels that her medication is not working and she just wants to sleep and die. She reports passive si and verbally contracts for safety. She denies hi/a/v hallucinations. Writer observed her boyfriend visit her this evening. She reports that the visit went well and he is her only positive support. Support given, safety maintained on unit with 15 min checks.

## 2014-11-07 NOTE — Progress Notes (Signed)
D:Pt has been in bed all morning with si thoughts. Pt rates depression and hopelessness as a 10 on 0-10 scale with 10 being the most. Pt is guarded turning over in bed not wanting to talk about her stressors. Pt did report that her family will have nothing to do with her. She has cards on her night stand that she says were given to her by her boyfriend. When pt was asked to contract for safety, she responded that there was nothing in the hospital to hurt herself with.  A:Offered support, encouragement and 15 minute checks.  R:Safety maintained on the unit.

## 2014-11-08 LAB — PHENYTOIN LEVEL, TOTAL: PHENYTOIN LVL: 4.5 ug/mL — AB (ref 10.0–20.0)

## 2014-11-08 MED ORDER — PHENYTOIN SODIUM EXTENDED 100 MG PO CAPS
200.0000 mg | ORAL_CAPSULE | Freq: Two times a day (BID) | ORAL | Status: DC
  Administered 2014-11-09 – 2014-11-14 (×11): 200 mg via ORAL
  Filled 2014-11-08: qty 2
  Filled 2014-11-08: qty 28
  Filled 2014-11-08 (×10): qty 2
  Filled 2014-11-08: qty 28
  Filled 2014-11-08 (×4): qty 2

## 2014-11-08 MED ORDER — ONDANSETRON 4 MG PO TBDP
4.0000 mg | ORAL_TABLET | Freq: Three times a day (TID) | ORAL | Status: DC | PRN
  Administered 2014-11-09 – 2014-11-13 (×5): 4 mg via ORAL
  Filled 2014-11-08 (×6): qty 1

## 2014-11-08 MED ORDER — DULOXETINE HCL 60 MG PO CPEP
60.0000 mg | ORAL_CAPSULE | Freq: Every day | ORAL | Status: DC
  Administered 2014-11-09 – 2014-11-14 (×6): 60 mg via ORAL
  Filled 2014-11-08 (×3): qty 1
  Filled 2014-11-08: qty 7
  Filled 2014-11-08 (×5): qty 1

## 2014-11-08 MED ORDER — DICYCLOMINE HCL 20 MG PO TABS
20.0000 mg | ORAL_TABLET | Freq: Three times a day (TID) | ORAL | Status: DC | PRN
  Administered 2014-11-08: 20 mg via ORAL
  Filled 2014-11-08 (×2): qty 1

## 2014-11-08 MED ORDER — ONDANSETRON 8 MG PO TBDP
8.0000 mg | ORAL_TABLET | Freq: Once | ORAL | Status: AC
  Administered 2014-11-08: 8 mg via ORAL
  Filled 2014-11-08: qty 1

## 2014-11-08 NOTE — [facility] Group Notes (Signed)
[facility] LCSW Group Therapy 11/08/2014  1:15 PM   Type of Therapy: Group Therapy  Participation Level: Did Not Attend. Patient invited to participate but declined.   Jamall Strohmeier, MSW, LCSWA Clinical Social Worker [facility] [phone]   

## 2014-11-08 NOTE — BH Assessment (Signed)
Patient on the hallway during the assessment. She reported feeling like her "whole body is been bit up". She said she overdosed on Morphine. She denied SI/HI and denied during the assessment. Although she appeared Labile, sad and depressed. Writer encouraged and supported patient. 1:1 observations continues to maintain safety.

## 2014-11-08 NOTE — Tx Team (Signed)
Interdisciplinary Treatment Plan Update (Adult) Date: 11/08/2014   Date: 11/08/2014 5:39 PM  Progress in Treatment:  Attending groups: No Participating in groups: No Taking medication as prescribed: Yes  Tolerating medication: Yes  Family/Significant othe contact made: No, CSW assessing for appropriate contacts Patient understands diagnosis: Yes Discussing patient identified problems/goals with staff: Yes  Medical problems stabilized or resolved: Yes  Denies suicidal/homicidal ideation: No, endorses SI and continual thoughts of dying. Patient has not harmed self or Others: Yes   New problem(s) identified: None identified at this time.   Discharge Plan or Barriers: CSW will assess for appropriate discharge plan and relevant barriers.   Additional comments: n/a   Reason for Continuation of Hospitalization:  Depression Medication stabilization Suicidal ideation  Estimated length of stay: 3-5 days  Review of initial/current patient goals per problem list:   1.  Goal(s): Patient will participate in aftercare plan  Met:  No  Target date: 3-5 days from date of admission   As evidenced by: Patient will participate within aftercare plan AEB aftercare provider and housing plan at discharge being identified.   11/08/14: Pt continues to decline to participate in discharge planning. CSW continuing to assess for appropriate discharge plan.  2.  Goal (s): Patient will exhibit decreased depressive symptoms and suicidal ideations.  Met:  No  Target date:3-5 days from date of admission   As evidenced by: Patient will utilize self rating of depression at 3 or below and demonstrate decreased signs of depression or be deemed stable for discharge by MD.  11/08/14: Pt rating depression at 10/10 and hopelessness at 10/10. Continues to endorse SI without a plan. 4.  Goal(s): Patient will demonstrate decreased signs of withdrawal due to substance abuse  Met:  Goal progressing  Target date:  3-5 days from date of admission   As evidenced by: Patient will produce a CIWA/COWS score of 0, have stable vitals signs, and no symptoms of withdrawal  11/08/14: on 8/21 CIWA score of 8 reported. No updated score at this time.   Attendees:  Patient:    Family:    Physician: Dr. Parke Poisson, MD  11/08/2014 5:39 PM  Nursing: Lars Pinks, RN Case manager  11/08/2014 5:39 PM  Clinical Social Worker Norman Clay, MSW 11/08/2014 5:39 PM  Other: Jake Bathe Liasion 11/08/2014 5:39 PM  Clinical:  Lise Auer, RN 11/08/2014 5:39 PM  Other: , RN Charge Nurse 11/08/2014 5:39 PM  Other:     Peri Maris, [place] MSW

## 2014-11-08 NOTE — Progress Notes (Signed)
D. Pt was visible in milieu this evening, however had minimal interaction or participation. Pt does endorse anxiety, reports on-going depression and does appear depressed and withdrawn while in the milieu. Pt spoke about some depressive symptoms that she has been experiencing, pt did receive all medications without incident this evening. A. Support and encouragement provided. R. Safety maintained, will continue to monitor.

## 2014-11-08 NOTE — Progress Notes (Signed)
Pt is in her room, in bed, with her eyes closed - observation 1:1 with sitter.

## 2014-11-08 NOTE — Progress Notes (Signed)
CSW attempted to meet with Pt to complete PSA; however Pt continues to report that she feels sick and would like CSW to come back later. CSW will continue to attempt assessment.   Chad Cordial, LCSWA Clinical Social Work [phone]

## 2014-11-08 NOTE — [facility] Group Notes (Signed)
The focus of this group is to educate the patient on the purpose and policies of crisis stabilization and provide a format to answer questions about their admission.  The group details unit policies and expectations of patients while admitted.  Patient did not attend 0900 nurse education orientation group this morning.  Patient stayed in her room. 

## 2014-11-08 NOTE — Progress Notes (Signed)
Pt placed on 1:1 after talking with Dr. Jama Flavors. She said, "I am not suicidal, but I don't want to be here."  She is in her room, in bed, where she has been all morning.

## 2014-11-08 NOTE — Progress Notes (Signed)
Patient ID: Katherine Bond, female   DOB: February 04, 1976, 39 y.o.   MRN: [id number] [facility] MD Progress Note  11/08/2014 3:44 PM Dylan Ruotolo  MRN:  [id number]   Subjective:  Patient continues to feel severely depressed. She is ruminative about her losses, to include separation and not having her children. She is tearful throughout session. She states she has been " wanting to die", and expresses a sense of frustration and dismay that she did not die from recent overdose . Denies medication side effects but does not feel they are helping significantly.    Objective::  Patient seen, chart reviewed, and discussed with staff.  Little group/milieu participation. Keeps to self. No agitated or disruptive behaviors . Presents severely depressed , tearful, and responds minimally to support, encouragement, empathy today. Ruminates about not having easy access to her children, whom she states she last saw late last year. She denies medication  Side effects. As noted, continues to have suicidal ideations, states she has " not figured out " how to hurt self on the unit, but at this time is not able to contract for safety. Denies psychotic symptoms and does not appear internally preoccupied .  Dilantin level 4.5 , patient has a history of seizures - describes history of absence type seizures, and some occasional grand mal seizures . States she had done "OK" with Keppra but had stopped because she could not afford this medication. She has had no seizures on unit .   Principal Problem: MDD (major depressive disorder), recurrent severe, without psychosis Diagnosis:   Patient Active Problem List   Diagnosis Date Noted  . Panic disorder [F41.0] 11/05/2014  . Convulsion [R56.9]   . MDD (major depressive disorder), recurrent severe, without psychosis [F33.2]    Total Time spent with patient: 25 minutes    Past Medical History:  Past Medical History  Diagnosis Date  . Bipolar 1 disorder     Past Surgical History   Procedure Laterality Date  . Pancreaticoduodenectomy    . Cholecystectomy    . Abdominal hysterectomy      partial   . Dental surgery     Family History: History reviewed. No pertinent family history. Social History:  History  Alcohol Use  . Yes    Comment: 5th of vodka in two days     History  Drug Use No    Social History   Social History  . Marital Status: Divorced    Spouse Name: N/A  . Number of Children: N/A  . Years of Education: N/A   Social History Main Topics  . Smoking status: Current Every Day Smoker -- 1.00 packs/day for 15 years    Types: Cigarettes  . Smokeless tobacco: None  . Alcohol Use: Yes     Comment: 5th of vodka in two days  . Drug Use: No  . Sexual Activity: Yes   Other Topics Concern  . None   Social History Narrative   Additional History:    Sleep:  Improved   Appetite:  Fair   Assessment:   Some physical improvement in patient since she is able to stand without falling and able to hold food down today.  Patient is able to contract for safety and is not crying so somewhat more stable than yesterday.    Musculoskeletal: Strength & Muscle Tone: within normal limits Gait & Station: normal Patient leans: N/A   Psychiatric Specialty Exam: Physical Exam  Constitutional: She is oriented to person, place, and time.  Neck:  Normal range of motion.  Respiratory: Effort normal.  Musculoskeletal: Normal range of motion.  Neurological: She is alert and oriented to person, place, and time.  Psychiatric: Her speech is normal and behavior is normal. Cognition and memory are normal. She expresses impulsivity. She exhibits a depressed mood. She expresses suicidal ideation.    Review of Systems  Constitutional: Positive for malaise/fatigue.  Gastrointestinal: Negative for nausea, vomiting, abdominal pain and diarrhea.  Neurological: Positive for seizures (history) and weakness. Negative for dizziness and tremors.  Psychiatric/Behavioral:  Positive for depression, suicidal ideas and substance abuse. The patient is nervous/anxious.   All other systems reviewed and are negative. complains of constipation, denies nausea or vomiting today  Blood pressure 106/59, pulse 56, temperature 98.7 F (37.1 C), temperature source Oral, resp. rate 16, height 5\' 3"  (1.6 m), weight 130 lb (58.968 kg).Body mass index is 23.03 kg/(m^2).  General Appearance: Fairly Groomed  Patent attorney::  Good   Speech:  Clear and Coherent  Volume:  Decreased  Mood:  Depressed  Affect:  Constricted and Depressed, tearful  Thought Process:  Linear  Orientation:  Full (Time, Place, and Person)  Thought Content:  Rumination and ruminative about psychosocial losses, no hallucinations, no delusions- does not appear internally preoccupied   Suicidal Thoughts:   Ongoing SI, without current plan , but unable to contract for safety at this time  Homicidal Thoughts:  No  Memory:  Immediate;   Fair Recent;   Fair Remote;   Fair  Judgement:  Fair  Insight:  Fair and Shallow  Psychomotor Activity:  Decreased  Concentration:  Good  Recall:  Good  Fund of Knowledge:Good  Language: Good  Akathisia:  Negative  Handed:  Right  AIMS (if indicated):     Assets:  Communication Skills  ADL's:  Intact  Cognition: WNL  Sleep:  Number of Hours: 6.75     Current Medications: Current Facility-Administered Medications  Medication Dose Route Frequency Provider Last Rate Last Dose  . acetaminophen (TYLENOL) tablet 650 mg  650 mg Oral Q6H PRN Jomarie Longs, MD   650 mg at 11/05/14 1248  . alum & mag hydroxide-simeth (MAALOX/MYLANTA) 200-200-20 MG/5ML suspension 30 mL  30 mL Oral Q4H PRN Jomarie Longs, MD      . dicyclomine (BENTYL) tablet 20 mg  20 mg Oral Q8H PRN Thermon Leyland, NP   20 mg at 11/08/14 1217  . [START ON 11/09/2014] DULoxetine (CYMBALTA) DR capsule 60 mg  60 mg Oral Daily Rockey Situ Timmothy Baranowski, MD      . hydrOXYzine (ATARAX/VISTARIL) tablet 50 mg  50 mg Oral TID  PRN Worthy Flank, NP   50 mg at 11/07/14 2053  . LORazepam (ATIVAN) injection 2 mg  2 mg Intramuscular Q4H PRN Jomarie Longs, MD      . LORazepam (ATIVAN) tablet 1 mg  1 mg Oral Q4H PRN Jomarie Longs, MD   1 mg at 11/07/14 1723  . magnesium hydroxide (MILK OF MAGNESIA) suspension 30 mL  30 mL Oral Daily PRN Jomarie Longs, MD   30 mL at 11/07/14 1105  . ondansetron (ZOFRAN-ODT) disintegrating tablet 4 mg  4 mg Oral Q8H PRN Thermon Leyland, NP      . Melene Muller ON 11/09/2014] phenytoin (DILANTIN) ER capsule 200 mg  200 mg Oral BID Craige Cotta, MD      . traZODone (DESYREL) tablet 50 mg  50 mg Oral QHS PRN Jomarie Longs, MD   50 mg at 11/07/14 2053  Lab Results:  Results for orders placed or performed during the hospital encounter of 11/05/14 (from the past 48 hour(s))  Phenytoin level, total     Status: Abnormal   Collection Time: 11/08/14  6:24 AM  Result Value Ref Range   Phenytoin Lvl 4.5 (L) 10.0 - 20.0 ug/mL    Comment: Performed at [facility]    Physical Findings: AIMS: Facial and Oral Movements Muscles of Facial Expression: None, normal Lips and Perioral Area: None, normal Jaw: None, normal Tongue: None, normal,Extremity Movements Upper (arms, wrists, hands, fingers): None, normal Lower (legs, knees, ankles, toes): None, normal, Trunk Movements Neck, shoulders, hips: None, normal, Overall Severity Severity of abnormal movements (highest score from questions above): None, normal Incapacitation due to abnormal movements: None, normal Patient's awareness of abnormal movements (rate only patient's report): No Awareness, Dental Status Current problems with teeth and/or dentures?: No Does patient usually wear dentures?: No  CIWA:  CIWA-Ar Total: 3 COWS:  COWS Total Score: 8   Assessment- patient presents quite depressed, tearful, sad, and ruminative about her losses . She has ongoing Suicidal ideations and states she hopes she would have died from her  recent overdose . At this time she has no specific plan to hurt self but remains suicidal and is unable to contract for safety at present. She is tolerating medications well at present .  Dilantin level subtherapeutic- no side effects.  Of note, patient is not presenting with any withdrawal symptoms and states she was not using opiates or alcohol regularly / consistently.   Treatment Plan Summary: Daily contact with patient to assess and evaluate symptoms and progress in treatment and Medication management  PLAN OF CARE:  Continue inpatient treatment. Continue to encourage group /milieu participation.  One to one observation due to suicidal ideations/ for safety- this reviewed with RN Eyvonne Mechanic. . Increase Cymbalta  To 60  mgrs QDAY initially for depression and anxiety.  Continue Hydroxyzine for management of Anxiety as needed . Continue  Ativan PRNs as per CIWA protocol, to minimize risk of WDL. -(  patient admission BAL 229 )   Increase  Dilantin ER  To 200  mg  BID for history of seizures  Continue Trazodone 50 mgrs QHS PRN Insomnia .  Will continue with current plan of care; no changes at this time.  Medical Decision Making:  Review of Psycho-Social Stressors (1), Review of Last Therapy Session (1) and Independent Review of image, tracing or specimen (2)   Nehemiah Massed,  MD  11/08/2014, 3:44 PM

## 2014-11-08 NOTE — Progress Notes (Signed)
Recreation Therapy Notes  Animal-Assisted Activity (AAA) Program Checklist/Progress Notes Patient Eligibility Criteria Checklist & Daily Group note for Rec Tx Intervention  Date: 08.23.2016 Time: 2:45pm Location: [address] Dayroom    AAA/T Program Assumption of Risk Form signed by Patient/ or Parent Legal Guardian yes  Patient is free of allergies or sever asthma yes  Patient reports no fear of animals yes  Patient reports no history of cruelty to animals yes  Patient understands his/her participation is voluntary yes  Patient washes hands before animal contact yes  Patient washes hands after animal contact yes  Behavioral Response: Did not attend.   Olie Dibert L Madisson Kulaga, LRT/CTRS  Anjelique Makar L 11/08/2014 3:07 PM 

## 2014-11-08 NOTE — Progress Notes (Signed)
NSG shift assessment. 7a-7p.   D: Pt has remained in bed all morning and did not tell anyone that she was feeling bad until after lunch. She took her morning medications. When it was time for her noon Depakote she shared that she has been suffering from nausea, some vomiting and abdominal cramping. She reports feeling very anxious.  She was put on 1:1 observation and after about two hours she wanted to contract for safety. This was reported to Dr. Jama Flavors who said to keep her on 1:1 observation for now.  She did not get out of bed until around 1730 and that was because she wanted something for anxiety. She felt like she was on the verge of a seizure. Her CIWA changed from 4 (which was based on observation because she had been sleeping and staying in bed) to 24 based on her actual answers to the questions on the CIWA.   A: Gave medication for nausea, abdominal pain and anxiety. Support and encouragement offered. Safety maintained with observations every 15 minutes.   R:   Contracts for safety.

## 2014-11-09 MED ORDER — ARIPIPRAZOLE 2 MG PO TABS
2.0000 mg | ORAL_TABLET | Freq: Every day | ORAL | Status: DC
  Administered 2014-11-09 – 2014-11-14 (×6): 2 mg via ORAL
  Filled 2014-11-09: qty 7
  Filled 2014-11-09 (×8): qty 1

## 2014-11-09 NOTE — [facility] Group Notes (Signed)
[facility] LCSW Aftercare Discharge Planning Group Note  11/09/2014 8:45 AM  Participation Quality: Alert, Appropriate and Oriented  Mood/Affect: Flat  Depression Rating: 9  Anxiety Rating: 9  Thoughts of Suicide: Pt denies SI/HI  Will you contract for safety? Yes  Current AVH: Pt denies  Plan for Discharge/Comments: Pt attended discharge planning group and actively participated in group. CSW discussed suicide prevention education with the group and encouraged them to discuss discharge planning and any relevant barriers. Pt presented with depressed mood, denied having outpatient providers at this time but is agreeable to referral. Pt reports that she does not have a place to go at DC. Pt stated, "I have nothing, nothing, but this room here."  Transportation Means: Pt reports access to transportation  Supports: No supports mentioned at this time  Chad Cordial, LCSWA 11/09/2014 9:18 AM

## 2014-11-09 NOTE — [facility] Group Notes (Addendum)
[facility] LCSW Group Therapy 11/09/2014 1:15 PM  Type of Therapy: Group Therapy- Emotion Regulation  Pt did not attend, declined invitation.   Chad Cordial, LCSWA 11/09/2014 3:17 PM

## 2014-11-09 NOTE — Progress Notes (Signed)
1 - 1 note. D: Pt in bed and resting comfortably at this time. Pt was up earlier, attended and participated in group activity. Pt did appear flat and depressed and did endorse such and also spoke about how she is feeling a little better. Pt spoke about how she in not having any thoughts at this time to want to hurt herself and she believes that tomorrow she will be able to contract for safety. Pt received all medications without incident before retiring to her room for the evening. A. Support and encouragement provided. R. Safety maintained, will continue to monitor.

## 2014-11-09 NOTE — Progress Notes (Signed)
Nursing 1:1 note- Patient is out in milieu.  She states she is "making herself" stay among other people.  Anti anxiety medication requested and received. 1:1 cont for safety.

## 2014-11-09 NOTE — Progress Notes (Signed)
Patient ID: Katherine Bond, female   DOB: 1975-05-26, 39 y.o.   MRN: [id number] [facility] MD Progress Note  11/09/2014 6:10 PM Katherine Bond  MRN:  [id number]   Subjective:  Patient  States she is feeling " a little bit better". She continues to present with significant depression, sadness. She has remained somewhat isolative . Marland Kitchen    Objective::  Patient seen, chart reviewed, and discussed with staff.  As discussed with staff, patient presenting with sad , depressed mood, frequent tearfulness, and soft speech. Patient acknowledges severe depression but states she feels a bit better today. However, she still presents very sad and tearful during our session, and remains ruminative about her losses and stressors, with a sense of pessimism and hopelessness . She admits she continues to " prefer if I was dead ".  No side effects from medications. No seizures on unit . Has been going to some groups and has been more visible on unit, but mostly keeps to self,limited interaction with others.   Principal Problem: MDD (major depressive disorder), recurrent severe, without psychosis Diagnosis:   Patient Active Problem List   Diagnosis Date Noted  . Panic disorder [F41.0] 11/05/2014  . Convulsion [R56.9]   . MDD (major depressive disorder), recurrent severe, without psychosis [F33.2]    Total Time spent with patient: 25 minutes    Past Medical History:  Past Medical History  Diagnosis Date  . Bipolar 1 disorder     Past Surgical History  Procedure Laterality Date  . Pancreaticoduodenectomy    . Cholecystectomy    . Abdominal hysterectomy      partial   . Dental surgery     Family History: History reviewed. No pertinent family history. Social History:  History  Alcohol Use  . Yes    Comment: 5th of vodka in two days     History  Drug Use No    Social History   Social History  . Marital Status: Divorced    Spouse Name: N/A  . Number of Children: N/A  . Years of Education: N/A   Social  History Main Topics  . Smoking status: Current Every Day Smoker -- 1.00 packs/day for 15 years    Types: Cigarettes  . Smokeless tobacco: None  . Alcohol Use: Yes     Comment: 5th of vodka in two days  . Drug Use: No  . Sexual Activity: Yes   Other Topics Concern  . None   Social History Narrative   Additional History:    Sleep:  Improved   Appetite:  Fair   Assessment:   Some physical improvement in patient since she is able to stand without falling and able to hold food down today.  Patient is able to contract for safety and is not crying so somewhat more stable than yesterday.    Musculoskeletal: Strength & Muscle Tone: within normal limits Gait & Station: normal Patient leans: N/A   Psychiatric Specialty Exam: Physical Exam  Constitutional: She is oriented to person, place, and time.  Neck: Normal range of motion.  Respiratory: Effort normal.  Musculoskeletal: Normal range of motion.  Neurological: She is alert and oriented to person, place, and time.  Psychiatric: Her speech is normal and behavior is normal. Cognition and memory are normal. She expresses impulsivity. She exhibits a depressed mood. She expresses suicidal ideation.    Review of Systems  Constitutional: Positive for malaise/fatigue.  Gastrointestinal: Negative for nausea, vomiting, abdominal pain and diarrhea.  Neurological: Positive for seizures (history)  and weakness. Negative for dizziness and tremors.  Psychiatric/Behavioral: Positive for depression, suicidal ideas and substance abuse. The patient is nervous/anxious.   All other systems reviewed and are negative. complains of constipation, denies nausea or vomiting today  Blood pressure 99/68, pulse 91, temperature 98 F (36.7 C), temperature source Oral, resp. rate 16, height 5\' 3"  (1.6 m), weight 130 lb (58.968 kg).Body mass index is 23.03 kg/(m^2).  General Appearance: improved grooming  Eye Contact::  Good   Speech:  Clear and Coherent   Volume:  Decreased  Mood:  Depressed  Affect:  Constricted and Depressed,  Remains tearful   Thought Process:  Linear  Orientation:  Full (Time, Place, and Person)  Thought Content:  Rumination and ruminative about psychosocial losses, no hallucinations, no delusions- does not appear internally preoccupied   Suicidal Thoughts:   Denies plan or intention of hurting self but continues to report passive SI and ruminations about preferring to die   Homicidal Thoughts:  No  Memory:  Immediate;   Fair Recent;   Fair Remote;   Fair  Judgement:  Fair  Insight:  Fair and Shallow  Psychomotor Activity:  Decreased, but slightly improved and more visible in  milieu  Concentration:  Good  Recall:  Good  Fund of Knowledge:Good  Language: Good  Akathisia:  Negative  Handed:  Right  AIMS (if indicated):     Assets:  Communication Skills  ADL's:  Intact  Cognition: WNL  Sleep:  Number of Hours: 6.75     Current Medications: Current Facility-Administered Medications  Medication Dose Route Frequency Provider Last Rate Last Dose  . acetaminophen (TYLENOL) tablet 650 mg  650 mg Oral Q6H PRN Jomarie Longs, MD   650 mg at 11/09/14 1307  . alum & mag hydroxide-simeth (MAALOX/MYLANTA) 200-200-20 MG/5ML suspension 30 mL  30 mL Oral Q4H PRN Saramma Eappen, MD      . ARIPiprazole (ABILIFY) tablet 2 mg  2 mg Oral Daily Craige Cotta, MD   2 mg at 11/09/14 1307  . dicyclomine (BENTYL) tablet 20 mg  20 mg Oral Q8H PRN Thermon Leyland, NP   20 mg at 11/08/14 1217  . DULoxetine (CYMBALTA) DR capsule 60 mg  60 mg Oral Daily Craige Cotta, MD   60 mg at 11/09/14 0829  . hydrOXYzine (ATARAX/VISTARIL) tablet 50 mg  50 mg Oral TID PRN Worthy Flank, NP   50 mg at 11/09/14 1307  . LORazepam (ATIVAN) injection 2 mg  2 mg Intramuscular Q4H PRN Jomarie Longs, MD      . LORazepam (ATIVAN) tablet 1 mg  1 mg Oral Q4H PRN Jomarie Longs, MD   1 mg at 11/09/14 1758  . magnesium hydroxide (MILK OF MAGNESIA)  suspension 30 mL  30 mL Oral Daily PRN Jomarie Longs, MD   30 mL at 11/07/14 1105  . ondansetron (ZOFRAN-ODT) disintegrating tablet 4 mg  4 mg Oral Q8H PRN Thermon Leyland, NP   4 mg at 11/09/14 1478  . phenytoin (DILANTIN) ER capsule 200 mg  200 mg Oral BID Craige Cotta, MD   200 mg at 11/09/14 1758  . traZODone (DESYREL) tablet 50 mg  50 mg Oral QHS PRN Jomarie Longs, MD   50 mg at 11/08/14 2140    Lab Results:  Results for orders placed or performed during the hospital encounter of 11/05/14 (from the past 48 hour(s))  Phenytoin level, total     Status: Abnormal   Collection Time: 11/08/14  6:24 AM  Result Value Ref Range   Phenytoin Lvl 4.5 (L) 10.0 - 20.0 ug/mL    Comment: Performed at [facility]    Physical Findings: AIMS: Facial and Oral Movements Muscles of Facial Expression: None, normal Lips and Perioral Area: None, normal Jaw: None, normal Tongue: None, normal,Extremity Movements Upper (arms, wrists, hands, fingers): None, normal Lower (legs, knees, ankles, toes): None, normal, Trunk Movements Neck, shoulders, hips: None, normal, Overall Severity Severity of abnormal movements (highest score from questions above): None, normal Incapacitation due to abnormal movements: None, normal Patient's awareness of abnormal movements (rate only patient's report): No Awareness, Dental Status Current problems with teeth and/or dentures?: No Does patient usually wear dentures?: No  CIWA:  CIWA-Ar Total: 0 COWS:  COWS Total Score: 8   Assessment- patient remains severely depressed, tearful, sad, ruminative about stressors, and endorsing passive /ongoing SI as well as sense of hopelessness . She is tolerating medications well.   Treatment Plan Summary: Daily contact with patient to assess and evaluate symptoms and progress in treatment and Medication management  PLAN OF CARE:  Continue inpatient treatment. Continue to encourage group /milieu participation.   Continue One to one observation due to suicidal ideations/ for safety- this reviewed with RN Eyvonne Mechanic. . Continue  Cymbalta 60  mgrs QDAY initially for depression and anxiety.  Continue Hydroxyzine for management of Anxiety as needed . Continue  Ativan PRNs as per CIWA protocol, to minimize risk of WDL. -(  patient admission BAL 229 )   Continue Dilantin ER   200  mg  BID for history of seizures  Start Abilify 2 mgrs QDAY to address ongoing depression- as augmentation strategy- patient agrees - side effects reviewed.  Continue Trazodone 50 mgrs QHS PRN Insomnia .  Will continue with current plan of care; no changes at this time.  Medical Decision Making:  Review of Psycho-Social Stressors (1), Review of Last Therapy Session (1) and Independent Review of image, tracing or specimen (2)   Nehemiah Massed,  MD  11/09/2014, 6:10 PM
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
## 2014-11-09 NOTE — Progress Notes (Signed)
Nursing 1:1 note- Patient continues to be flat and depressed. She will not be forthcoming with contracting for safety.  She continues to ruminate on her depressive symptoms with little insight into recovery tools.  States "I still feel anxious" when asked about medication effectiveness. Support offered and 1:1 cont for safety.

## 2014-11-09 NOTE — Progress Notes (Signed)
Nursing 1:1 Note at 0100 am: Patient resting quietly with her eyes closed. Respirations even and unlabored. No distress noted and 1:1 observations continues as ordered to maintain safety.

## 2014-11-09 NOTE — Progress Notes (Signed)
1:1 Nursing Note @ 0630 am Patient awake and came to day room for her vital signs. Her mood and affects appropriate. She reported feeling better when she woke up. She later came and complaint of nausea, headache, stomach upset and feeling very anxious. Patient rated her headache at 8 on a scale of 1-10 with 10 the worst. Ativan, Zofran and tylenol given. 1:1 observations continue for safety as ordered.

## 2014-11-09 NOTE — Progress Notes (Signed)
Nursing 1:1 note-Patient is anxious and tearful.  She reports depression at 8 and hopelessness at 9.  Anxiety is 10.  Denies SI and contracts for safety.  Reports "physical pain" but did not request prn medication.  Support offered.

## 2014-11-09 NOTE — Progress Notes (Signed)
[facility] Group Notes:  (Nursing/MHT/Case Management/Adjunct)  Date:  11/09/2014  Time:  9:23 PM  Type of Therapy:  Psychoeducational Skills  Participation Level:  Active  Participation Quality:  Appropriate and Attentive  Affect:  Appropriate  Cognitive:  Appropriate  Insight:  Appropriate  Engagement in Group:  Engaged  Modes of Intervention:  Activity  Summary of Progress/Problems: Pts played a therapeutic activity of Mental Health Jeopardy. Pt was actively engaged and participated appropriately.  Hasnain Manheim C 11/09/2014, 9:23 PM 

## 2014-11-10 MED ORDER — TRAZODONE HCL 50 MG PO TABS
50.0000 mg | ORAL_TABLET | Freq: Once | ORAL | Status: AC
Start: 2014-11-10 — End: 2014-11-10
  Administered 2014-11-10: 50 mg via ORAL
  Filled 2014-11-10 (×2): qty 1

## 2014-11-10 MED ORDER — IBUPROFEN 600 MG PO TABS
600.0000 mg | ORAL_TABLET | Freq: Four times a day (QID) | ORAL | Status: DC | PRN
  Administered 2014-11-10 – 2014-11-14 (×5): 600 mg via ORAL
  Filled 2014-11-10 (×5): qty 1

## 2014-11-10 MED ORDER — NICOTINE 21 MG/24HR TD PT24
21.0000 mg | MEDICATED_PATCH | Freq: Every day | TRANSDERMAL | Status: DC
  Administered 2014-11-10 – 2014-11-14 (×5): 21 mg via TRANSDERMAL
  Filled 2014-11-10 (×6): qty 1

## 2014-11-10 MED ORDER — TRAZODONE HCL 50 MG PO TABS
50.0000 mg | ORAL_TABLET | Freq: Every evening | ORAL | Status: DC | PRN
  Administered 2014-11-10 – 2014-11-12 (×5): 50 mg via ORAL
  Filled 2014-11-10 (×10): qty 1

## 2014-11-10 MED ORDER — DIAZEPAM 2 MG PO TABS
2.0000 mg | ORAL_TABLET | Freq: Three times a day (TID) | ORAL | Status: DC | PRN
  Administered 2014-11-10 – 2014-11-14 (×11): 2 mg via ORAL
  Filled 2014-11-10 (×11): qty 1

## 2014-11-10 NOTE — Tx Team (Addendum)
Interdisciplinary Treatment Plan Update (Adult) Date: 11/10/2014   Date: 11/10/2014 2:08 PM  Progress in Treatment:  Attending groups: Yes Participating in groups: No Taking medication as prescribed: Yes  Tolerating medication: Yes  Family/Significant othe contact made: Yes, with boyfriend Patient understands diagnosis: Yes Discussing patient identified problems/goals with staff: Yes  Medical problems stabilized or resolved: Yes  Denies suicidal/homicidal ideation: No, endorses SI and continual thoughts of dying. Patient has not harmed self or Others: Yes   New problem(s) identified: None identified at this time.   Discharge Plan or Barriers: CSW will assess for appropriate discharge plan and relevant barriers.   11/10/14: Pt will return home with boyfriend and follow-up with [facility].  Additional comments: n/a   Reason for Continuation of Hospitalization:  Depression Medication stabilization Suicidal ideation  Estimated length of stay: 3-5 days  Review of initial/current patient goals per problem list:   1.  Goal(s): Patient will participate in aftercare plan  Met:  Yes  Target date: 3-5 days from date of admission   As evidenced by: Patient will participate within aftercare plan AEB aftercare provider and housing plan at discharge being identified.   11/08/14: Pt continues to decline to participate in discharge planning. CSW continuing to assess for appropriate discharge plan.  11/10/14: Pt will return home with boyfriend and follow-up with [facility]  2.  Goal (s): Patient will exhibit decreased depressive symptoms and suicidal ideations.  Met: Yes  Target date:3-5 days from date of admission   As evidenced by: Patient will utilize self rating of depression at 3 or below and demonstrate decreased signs of depression or be deemed stable for discharge by MD.  11/08/14: Pt rating depression at 10/10 and hopelessness at 10/10. Continues to endorse SI without a  plan.  11/10/14: Pt continues to rate depression highly, 8/10. Denies SI. Frequently tearful and maintains flat affect.  8/29: Goal met: Patient rates depression at 0, denies SI at this time. Reports feeling safe for discharge. 4.  Goal(s): Patient will demonstrate decreased signs of withdrawal due to substance abuse  Met:  Yes  Target date: 3-5 days from date of admission   As evidenced by: Patient will produce a CIWA/COWS score of 0, have stable vitals signs, and no symptoms of withdrawal  11/08/14: on 8/21 CIWA score of 8 reported. No updated score at this time.   11/10/14: CIWA score of 1. Anxiety listed as only remaining symptom of withdrawal.  Attendees:  Patient:    Family:    Physician: Dr. Parke Poisson, MD  11/10/2014 2:08 PM  Nursing: Lars Pinks, RN Case manager  11/10/2014 2:08 PM  Clinical Social Worker Peri Maris, Latanya Presser, MSW 11/10/2014 2:08 PM  Other: Lucinda Dell, Beverly Sessions Liasion 11/10/2014 2:08 PM  Clinical:  Gaylan Gerold, RN 11/10/2014 2:08 PM  Other: , RN Charge Nurse 11/10/2014 2:08 PM  Other:     Peri Maris, Latanya Presser MSW

## 2014-11-10 NOTE — [facility] Group Notes (Signed)
[facility] Mental Health Association Group Therapy 11/10/2014 1:15pm  Type of Therapy: Mental Health Association Presentation  Participation Level: Active  Participation Quality: Attentive  Affect: Appropriate  Cognitive: Oriented  Insight: Developing/Improving  Engagement in Therapy: Engaged  Modes of Intervention: Discussion, Education and Socialization  Summary of Progress/Problems: Mental Health Association (MHA) Speaker came to talk about his personal journey with substance abuse and addiction. The pt processed ways by which to relate to the speaker. MHA speaker provided handouts and educational information pertaining to groups and services offered by the [facility]. Pt was engaged in speaker's presentation and was receptive to resources provided.    Chad Cordial, LCSWA 11/10/2014 2:08 PM

## 2014-11-10 NOTE — Progress Notes (Signed)
Patient ID: Katherine Bond, female   DOB: 1976/02/17, 39 y.o.   MRN: [id number]  1:1 Nursing Group-  Patient is sitting in the MD office at this time speaking 1:1 with MD Cobos. Respirations are even and unlabored and patient is calm at this time. 1:1 is continued for safety and Q15 minute safety checks maintained.

## 2014-11-10 NOTE — Progress Notes (Signed)
Adult Psychoeducational Group Note  Date:  11/10/2014 Time:  0900  Group Topic/Focus:  Orientation:   The focus of this group is to educate the patient on the purpose and policies of crisis stabilization and provide a format to answer questions about their admission.  The group details unit policies and expectations of patients while admitted.  Participation Level:  Active  Participation Quality:  Appropriate  Affect:  Appropriate  Cognitive:  Appropriate  Insight: Appropriate  Engagement in Group:  Engaged  Modes of Intervention:  Education and Orientation  Additional Comments:    [name] L 11/10/2014, 6:18 PM

## 2014-11-10 NOTE — Progress Notes (Signed)
[facility] Post 1:1 Observation Documentation  For the first (8) hours following discontinuation of 1:1 precautions, a progress note entry by nursing staff should be documented at least every 2 hours, reflecting the patient's behavior, condition, mood, and conversation.  Use the progress notes for additional entries.  Time 1:1 discontinued:  1430  Patient's Behavior:  Patient is calm at this time. She just took a shower.  Patient's Condition:  No distress noted. Respirations are even and unlabored.   Patient's Conversation:  "I feel okay."   Larina Earthly 11/10/2014, 2:39 PM

## 2014-11-10 NOTE — Plan of Care (Signed)
Problem: Diagnosis: Increased Risk For Suicide Attempt Goal: STG-Patient Will Comply With Medication Regime Outcome: Progressing Patient is compliant with medication regimen however would like to speak to MD about changes.

## 2014-11-10 NOTE — Progress Notes (Signed)
[facility] Post 1:1 Observation Documentation  For the first (8) hours following discontinuation of 1:1 precautions, a progress note entry by nursing staff should be documented at least every 2 hours, reflecting the patient's behavior, condition, mood, and conversation.  Use the progress notes for additional entries.  Time 1:1 discontinued:  1430  Patient's Behavior:  Patient is calm at this time. Laying in bed.   Patient's Condition:  No distress noted. Respirations are even and unlabored.   Patient's Conversation:  No conversation at this time.   Marzetta Board E 11/10/2014, 5:48 PM

## 2014-11-10 NOTE — Progress Notes (Signed)
Patient ID: Katherine Bond, female   DOB: 1975-12-20, 38 y.o.   MRN: [id number]  1:1 Nursing Note-  Patient is in the dayroom at this time walking towards the galley in order to get a morning snack. She appears calm with no distress. Respirations are even and unlabored. Q15 minute safety checks are maintained and 1:1 is continued for safety.

## 2014-11-10 NOTE — Progress Notes (Signed)
[facility] Post 1:1 Observation Documentation  For the first (8) hours following discontinuation of 1:1 precautions, a progress note entry by nursing staff should be documented at least every 2 hours, reflecting the patient's behavior, condition, mood, and conversation.  Use the progress notes for additional entries.  Time 1:1 discontinued:  1430  Patient's Behavior:  Patient is calm at this time sitting in the dayroom. Her affect is flat but does not appear in any pain or discomfort.   Patient's Condition:  No distress, respirations are even and unlabored.   Patient's Conversation:  No conversation at this time.   Larina Earthly 11/10/2014, 6:39 PM

## 2014-11-10 NOTE — Progress Notes (Signed)
[facility] Group Notes:  (Nursing/MHT/Case Management/Adjunct)  Date:  11/10/2014  Time:  9:24 PM  Type of Therapy:  Psychoeducational Skills  Participation Level:  Active  Participation Quality:  Appropriate and Attentive  Affect:  Appropriate  Cognitive:  Appropriate  Insight:  Appropriate  Engagement in Group:  Engaged  Modes of Intervention:  Activity  Summary of Progress/Problems: Pts played a game of Wellness Jeopardy for Wrap Up group. Pt was engaged and participated appropriately. Pt stated one positive thing about her day is that she opened up more in groups, and is feeling better mentally/emotionally.  Caswell Corwin 11/10/2014, 9:24 PM

## 2014-11-10 NOTE — [facility] Suicide Risk Assessment (Signed)
[facility] INPATIENT:  Family/Significant Other Suicide Prevention Education  Suicide Prevention Education:  Education Completed; [place] Hearing , boyfriend [phone]  has been identified by the patient as the family member/significant other with whom the patient will be residing, and identified as the person(s) who will aid the patient in the event of a mental health crisis (suicidal ideations/suicide attempt).  With written consent from the patient, the family member/significant other has been provided the following suicide prevention education, prior to the and/or following the discharge of the patient.  The suicide prevention education provided includes the following:  Suicide risk factors  Suicide prevention and interventions  National Suicide Hotline telephone number  [facility] assessment telephone number  [facility] Emergency Assistance 911  [facility] and/or Residential Mobile Crisis Unit telephone number  Request made of family/significant other to:  Remove weapons (e.g., guns, rifles, knives), all items previously/currently identified as safety concern.    Remove drugs/medications (over-the-counter, prescriptions, illicit drugs), all items previously/currently identified as a safety concern.  The family member/significant other verbalizes understanding of the suicide prevention education information provided.  The family member/significant other agrees to remove the items of safety concern listed above.  Elaina Hoops 11/10/2014, 1:04 PM

## 2014-11-10 NOTE — Progress Notes (Signed)
1 - 1 note. D: Pt currently in bed and resting comfortably at this time, respirations even and unlabored and pt does not appear to be in any acute distress. A. Safety maintained. R. Will continue to monitor.

## 2014-11-10 NOTE — Progress Notes (Signed)
1 - 1 note. D: Pt in bed and currently resting at this time. Pt was up short while ago and had complaints of nausea and requested and received zofran. Respirations are even and unlabored in pt and pt does not appear to be in any acute distress at this time. A. Safety maintained. R. Will continue to monitor.

## 2014-11-10 NOTE — Progress Notes (Signed)
Patient ID: Katherine Bond, female   DOB: 1976-01-09, 39 y.o.   MRN: [id number] [facility] MD Progress Note  11/10/2014 4:54 PM Katherine Bond  MRN:  [id number]   Subjective:   Although still depressed patient is feeling partially better, and states she feels less sad and less hopeless . At this time denies medication  Side effects. She does state she has significant anxiety symptoms and does not think Ativan has helped. She states she has been on Valium in the past, which helped " much better". She denies any suicidal ideations, and states she is feeling  A little more hopeful. For example , she spoke about getting a job " as soon as I get discharged, I know I can get a job easily".  She worries about not having a car, but has considered asking her parents for a loan so she can get a car and then pay back. Also, she seems less hopeless about upcoming court date for embezzlement charge- states the amount of money contested is only about $ 200 , and she has no prior legal record, so she thinks she will not be incarcerated . She is hoping it will not preclude her from getting another job. Does continue to ruminate about not seeing her children.  States she has been thinking of writing them a letter to tell them how much she misses them.    Objective::  Patient seen, chart reviewed, and discussed with staff.  As discussed with staff,  Patient has presented with some improvement of mood and affect- less tearful, a little more hopeful. Has denied any suicidal ideations and is more future oriented. Behavior on unit in good control, and has not exhibited any agitated or self injurious ideations.  Today presents more future oriented , discussing issues such as getting job and a vehicle. She presents with a fuller range of affect, not tearful or despondent . As above, she requests Valium to address significant anxiety and states she has tolerated this medication well in the past, without side effects. We reviewed the  rationale to avoid long term management with BZDs to minimize tolerance and dependence.  Agreed to short term PRN management during hospitalization, to help address anxiety.  Denies medications at this time . No seizures on unit . Visible in milieu, less withdrawn  Principal Problem: MDD (major depressive disorder), recurrent severe, without psychosis Diagnosis:   Patient Active Problem List   Diagnosis Date Noted  . Panic disorder [F41.0] 11/05/2014  . Convulsion [R56.9]   . MDD (major depressive disorder), recurrent severe, without psychosis [F33.2]    Total Time spent with patient: 25 minutes    Past Medical History:  Past Medical History  Diagnosis Date  . Bipolar 1 disorder     Past Surgical History  Procedure Laterality Date  . Pancreaticoduodenectomy    . Cholecystectomy    . Abdominal hysterectomy      partial   . Dental surgery     Family History: History reviewed. No pertinent family history. Social History:  History  Alcohol Use  . Yes    Comment: 5th of vodka in two days     History  Drug Use No    Social History   Social History  . Marital Status: Divorced    Spouse Name: N/A  . Number of Children: N/A  . Years of Education: N/A   Social History Main Topics  . Smoking status: Current Every Day Smoker -- 1.00 packs/day for 15 years  Types: Cigarettes  . Smokeless tobacco: None  . Alcohol Use: Yes     Comment: 5th of vodka in two days  . Drug Use: No  . Sexual Activity: Yes   Other Topics Concern  . None   Social History Narrative   Additional History:    Sleep:  Improved   Appetite:  Fair   Assessment:   Some physical improvement in patient since she is able to stand without falling and able to hold food down today.  Patient is able to contract for safety and is not crying so somewhat more stable than yesterday.    Musculoskeletal: Strength & Muscle Tone: within normal limits Gait & Station: normal Patient leans:  N/A   Psychiatric Specialty Exam: Physical Exam  Constitutional: She is oriented to person, place, and time.  Neck: Normal range of motion.  Respiratory: Effort normal.  Musculoskeletal: Normal range of motion.  Neurological: She is alert and oriented to person, place, and time.  Psychiatric: Her speech is normal and behavior is normal. Cognition and memory are normal. She expresses impulsivity. She exhibits a depressed mood. She expresses suicidal ideation.    Review of Systems  Constitutional: Positive for malaise/fatigue.  Gastrointestinal: Negative for nausea, vomiting, abdominal pain and diarrhea.  Neurological: Positive for seizures (history) and weakness. Negative for dizziness and tremors.  Psychiatric/Behavioral: Positive for depression, suicidal ideas and substance abuse. The patient is nervous/anxious.   All other systems reviewed and are negative. complains of constipation, denies nausea or vomiting today  Blood pressure 111/72, pulse 97, temperature 98.4 F (36.9 C), temperature source Oral, resp. rate 18, height 5\' 3"  (1.6 m), weight 130 lb (58.968 kg).Body mass index is 23.03 kg/(m^2).  General Appearance: improved grooming  Eye Contact::  Good   Speech:  Clear and Coherent  Volume:  Decreased  Mood:   Still depressed, but states she is feeling a little better   Affect: less constricted, smiles at times appropriately.   Thought Process:  Linear  Orientation:  Full (Time, Place, and Person)  Thought Content:   No hallucinations , no delusions, not internally preoccupied   Suicidal Thoughts:   Denies plan or intention of hurting self  Or of SI, and at this time contracts for safety on the unit   Homicidal Thoughts:  No  Memory:  Immediate;   Fair Recent;   Fair Remote;   Fair  Judgement:   Improving   Insight:   improving  Psychomotor Activity:  improving, but slightly improved and more visible in  milieu  Concentration:  Good  Recall:  Good  Fund of  Knowledge:Good  Language: Good  Akathisia:  Negative  Handed:  Right  AIMS (if indicated):     Assets:  Communication Skills  ADL's:  Intact  Cognition: WNL  Sleep:  Number of Hours: 5.25     Current Medications: Current Facility-Administered Medications  Medication Dose Route Frequency Provider Last Rate Last Dose  . acetaminophen (TYLENOL) tablet 650 mg  650 mg Oral Q6H PRN Jomarie Longs, MD   650 mg at 11/10/14 0826  . alum & mag hydroxide-simeth (MAALOX/MYLANTA) 200-200-20 MG/5ML suspension 30 mL  30 mL Oral Q4H PRN Saramma Eappen, MD      . ARIPiprazole (ABILIFY) tablet 2 mg  2 mg Oral Daily Craige Cotta, MD   2 mg at 11/10/14 0824  . diazepam (VALIUM) tablet 2 mg  2 mg Oral Q8H PRN Craige Cotta, MD   2 mg at 11/10/14 1505  .  dicyclomine (BENTYL) tablet 20 mg  20 mg Oral Q8H PRN Thermon Leyland, NP   20 mg at 11/08/14 1217  . DULoxetine (CYMBALTA) DR capsule 60 mg  60 mg Oral Daily Craige Cotta, MD   60 mg at 11/10/14 0824  . hydrOXYzine (ATARAX/VISTARIL) tablet 50 mg  50 mg Oral TID PRN Worthy Flank, NP   50 mg at 11/10/14 0826  . ibuprofen (ADVIL,MOTRIN) tablet 600 mg  600 mg Oral Q6H PRN Adonis Brook, NP   600 mg at 11/10/14 1506  . magnesium hydroxide (MILK OF MAGNESIA) suspension 30 mL  30 mL Oral Daily PRN Jomarie Longs, MD   30 mL at 11/07/14 1105  . nicotine (NICODERM CQ - dosed in mg/24 hours) patch 21 mg  21 mg Transdermal Daily Craige Cotta, MD   21 mg at 11/10/14 0829  . ondansetron (ZOFRAN-ODT) disintegrating tablet 4 mg  4 mg Oral Q8H PRN Thermon Leyland, NP   4 mg at 11/10/14 1506  . phenytoin (DILANTIN) ER capsule 200 mg  200 mg Oral BID Craige Cotta, MD   200 mg at 11/10/14 0824  . traZODone (DESYREL) tablet 50 mg  50 mg Oral QHS,MR X 1 Evanna Burkett, NP        Lab Results:  No results found for this or any previous visit (from the past 48 hour(s)).  Physical Findings: AIMS: Facial and Oral Movements Muscles of Facial Expression: None,  normal Lips and Perioral Area: None, normal Jaw: None, normal Tongue: None, normal,Extremity Movements Upper (arms, wrists, hands, fingers): None, normal Lower (legs, knees, ankles, toes): None, normal, Trunk Movements Neck, shoulders, hips: None, normal, Overall Severity Severity of abnormal movements (highest score from questions above): None, normal Incapacitation due to abnormal movements: None, normal Patient's awareness of abnormal movements (rate only patient's report): No Awareness, Dental Status Current problems with teeth and/or dentures?: No Does patient usually wear dentures?: No  CIWA:  CIWA-Ar Total: 1 COWS:  COWS Total Score: 8   Assessment-  Patient is improving gradually . Improved range of affect noted by staff and Clinical research associate. She is more future oriented- thinking of getting job and car after discharge . She denies any suicidal ideations or self injurious ideations at this time and behavior has been calm/in good control. She is tolerating medications well - no side effects reported. She reports ongoing anxiety in spite of Ativan PRNs and prefers Valium PRNs, which she has been on in the past .    Treatment Plan Summary: Daily contact with patient to assess and evaluate symptoms and progress in treatment and Medication management  PLAN OF CARE:  Continue inpatient treatment. Continue to encourage group /milieu participation.   Have discussed case with Nursing staff - D/C one to one observation at this time, as patient improved  - see above .  Continue  Cymbalta 60  mgrs QDAY initially for depression and anxiety.  Continue Hydroxyzine for management of Anxiety as needed . D/C Ativan. Start Valium 2 mgrs Q 8 hours PRN Agitation / Anxiety Continue Dilantin ER   200  mg  BID for history of seizures  Continue Abilify 2 mgrs QDAY as augmentation to address depression.  Continue Trazodone 50 mgrs QHS PRN Insomnia .  Will continue with current plan of care; no changes at this  time.  Medical Decision Making:  Review of Psycho-Social Stressors (1), Review of Last Therapy Session (1) and Independent Review of image, tracing or specimen (2)  Nehemiah Massed,  MD  11/10/2014, 4:54 PM

## 2014-11-11 NOTE — Progress Notes (Addendum)
[facility] Post 1:1 Observation Documentation  For the first (8) hours following discontinuation of 1:1 precautions, a progress note entry by nursing staff should be documented at least every 2 hours, reflecting the patient's behavior, condition, mood, and conversation.  Use the progress notes for additional entries.  Time 1:1 discontinued:  1630  Patient's Behavior:  Appropriate, calm cooperative  Patient's Condition: Good    Patient's Conversation:  Patient asleep at present time  Cooper Render 11/11/2014, 5:09 AM

## 2014-11-11 NOTE — [facility] Group Notes (Signed)
[facility] LCSW Aftercare Discharge Planning Group Note  11/11/2014 8:45 AM  Participation Quality: Alert, Appropriate and Oriented  Mood/Affect: Appropriate  Depression Rating: 0  Anxiety Rating: 3  Thoughts of Suicide: Pt denies SI/HI  Will you contract for safety? Yes  Current AVH: Pt denies  Plan for Discharge/Comments: Pt attended discharge planning group and actively participated in group. CSW discussed suicide prevention education with the group and encouraged them to discuss discharge planning and any relevant barriers. Pt reports feeling much better and presents with improved affect. She states that her depression is gone and is looking forward to DC.  Transportation Means: Pt reports access to transportation  Supports: No supports mentioned at this time  Chad Cordial, LCSWA 11/11/2014 9:53 AM

## 2014-11-11 NOTE — Progress Notes (Signed)
D Pt. Denies SI and HI, no complaints of pain or discomfort noted.    A Writer offered support and encouragement, discussed coping skills with pt.   R Pt. Reports the valium really helps to decrease her anxiety.  Pt rates her anxiety and depression at an 8, Pt. Remains safe on the unit.

## 2014-11-11 NOTE — [facility] Group Notes (Signed)
[facility] LCSW Group Therapy 11/11/2014 1:15pm  Type of Therapy: Group Therapy- Feelings Around Relapse and Recovery  Participation Level: Active   Participation Quality:  Appropriate  Affect:  Appropriate  Cognitive: Alert and Oriented   Insight:  Developing   Engagement in Therapy: Developing/Improving and Engaged   Modes of Intervention: Clarification, Confrontation, Discussion, Education, Exploration, Limit-setting, Orientation, Problem-solving, Rapport Building, Dance movement psychotherapist, Socialization and Support  Summary of Progress/Problems: The topic for today was feelings about relapse. The group discussed what relapse prevention is to them and identified triggers that they are on the path to relapse. Members also processed their feeling towards relapse and were able to relate to common experiences. Group also discussed coping skills that can be used for relapse prevention.  Pt demonstrates developing insight AEB her recognition that there is a pattern related to her relapse process. Pt discussed her transition from irritability to mood instability and how this ultimately leads to deep depression and eventually apathy and numbness. Pt reports that at this stage she is planning suicide. She was able to identify strategies to stop this pattern early to prevent becoming actively suicidal; her main strategy was to have an outsider be aware of her warning signs so they could help her seek help earlier.   Therapeutic Modalities:   Cognitive Behavioral Therapy Solution-Focused Therapy Assertiveness Training Relapse Prevention Therapy    Lamar Sprinkles [phone] 11/11/2014 4:45 PM

## 2014-11-11 NOTE — Progress Notes (Addendum)
[facility] Post 1:1 Observation Documentation  For the first (8) hours following discontinuation of 1:1 precautions, a progress note entry by nursing staff should be documented at least every 2 hours, reflecting the patient's behavior, condition, mood, and conversation.  Use the progress notes for additional entries.  Time 1:1 discontinued:  1630  Patient's Behavior:  Pt. Has been calm and cooperative, enjoyed her family visit  Patient's Condition:  Good, A and O  Patient's Conversation:  States she had a good day.  Cooper Render 11/11/2014, 5:06 AM

## 2014-11-11 NOTE — Plan of Care (Signed)
Problem: Diagnosis: Increased Risk For Suicide Attempt Goal: LTG-Patient Will Show Positive Response to Medication LTG (by discharge) : Patient will show positive response to medication and will participate in the development of the discharge plan.  Outcome: Progressing Patient reports that she feels much better and feels that her medications are working for her. She reports improvement in her feeling depressed and hopelessness since last weekend has been a positive change.

## 2014-11-11 NOTE — Progress Notes (Signed)
In wrap-up group pt stated that she has an awesome day, pt stated that her goal was, "to be out, happy, socialize and talk and I did!" Pt then added, "I'm starting to feel like me again."   Tomi Bamberger, MHT

## 2014-11-11 NOTE — Progress Notes (Signed)
Writer spoke with patient 1:1 and she reports feeling and doing much better and feels that her medications are working. She reported being disappointed finding out that she won't have valium as a medication once she is discharged. She reports that she has some pending charges and she plans to get a job and will be living with her boyfriend who has found a house for them. She reports that she possibly may have to apply for disability because she feels that her mental health is starting to affect her ability to hold down a job especially with her seizures. She denies si/hi/a/v hallucinations. Support given, safety maintained on unit with 15 min checks.

## 2014-11-11 NOTE — Progress Notes (Signed)
Patient ID: Katherine Bond, female   DOB: 1975/03/31, 39 y.o.   MRN: [id number] [facility] MD Progress Note  11/11/2014 11:30 AM Katherine Bond  MRN:  [id number]   Subjective:  Patient today states " I feel more hopeful today , I am getting a lot of support from my boyfriend and that has helped me to think differently.'    Objective::  Patient seen, chart reviewed, and discussed with staff.  As discussed with staff,  Patient has been presenting with some improvement of mood and affect- she appears to be less tearful, has better grooming and less nervous .  Pt is encouraged by her boyfriend's support . Pt has been tolerating her medications well , especially she feels that the Valium might be helping a lot since it helps her anxiety and her seizures and wonders if she could be continued on it for seizures. However it was discussed with pt that - Valium is not a maintenance therapy for seizures and I have discussed the risks, as well as addictive potential of BZD medications . Pt today denies any SI , her thought pattern is improving.  Visible in milieu, less withdrawn  Per staff - pt continues to improve - denies any side effect concerns or compliance issues of medications.  Principal Problem: MDD (major depressive disorder), recurrent severe, without psychosis Diagnosis:   Patient Active Problem List   Diagnosis Date Noted  . Panic disorder [F41.0] 11/05/2014  . Convulsion [R56.9]   . MDD (major depressive disorder), recurrent severe, without psychosis [F33.2]    Total Time spent with patient: 25 minutes    Past Medical History:  Past Medical History  Diagnosis Date  . Bipolar 1 disorder     Past Surgical History  Procedure Laterality Date  . Pancreaticoduodenectomy    . Cholecystectomy    . Abdominal hysterectomy      partial   . Dental surgery     Family History: History reviewed. No pertinent family history. Social History:  History  Alcohol Use  . Yes    Comment: 5th of vodka in two  days     History  Drug Use No    Social History   Social History  . Marital Status: Divorced    Spouse Name: N/A  . Number of Children: N/A  . Years of Education: N/A   Social History Main Topics  . Smoking status: Current Every Day Smoker -- 1.00 packs/day for 15 years    Types: Cigarettes  . Smokeless tobacco: None  . Alcohol Use: Yes     Comment: 5th of vodka in two days  . Drug Use: No  . Sexual Activity: Yes   Other Topics Concern  . None   Social History Narrative   Additional History:    Sleep:  Improved   Appetite:  Fair     Musculoskeletal: Strength & Muscle Tone: within normal limits Gait & Station: normal Patient leans: N/A   Psychiatric Specialty Exam: Physical Exam  Constitutional: She is oriented to person, place, and time.  Neck: Normal range of motion.  Respiratory: Effort normal.  Musculoskeletal: Normal range of motion.  Neurological: She is alert and oriented to person, place, and time.  Psychiatric: Her speech is normal and behavior is normal. Cognition and memory are normal. She expresses impulsivity. She exhibits a depressed mood. She expresses suicidal ideation.    Review of Systems  Constitutional: Negative for malaise/fatigue.  Gastrointestinal: Negative for nausea, vomiting, abdominal pain and diarrhea.  Neurological: Positive  for seizures (history). Negative for dizziness, tremors and weakness.  Psychiatric/Behavioral: Positive for depression and substance abuse. Negative for suicidal ideas. The patient is nervous/anxious and has insomnia.   All other systems reviewed and are negative. complains of constipation, denies nausea or vomiting today  Blood pressure 90/67, pulse 91, temperature 98 F (36.7 C), temperature source Oral, resp. rate 16, height 5\' 3"  (1.6 m), weight 58.968 kg (130 lb).Body mass index is 23.03 kg/(m^2).  General Appearance: improved grooming  Eye Contact::  Good   Speech:  Clear and Coherent  Volume:   Decreased  Mood:   Still depressed, but states she is feeling  better   Affect: less constricted, smiles at times appropriately.   Thought Process:  Linear  Orientation:  Full (Time, Place, and Person)  Thought Content:   No hallucinations , no delusions, not internally preoccupied   Suicidal Thoughts:   Denies plan or intention of hurting self  Or of SI, and at this time contracts for safety on the unit   Homicidal Thoughts:  No  Memory:  Immediate;   Fair Recent;   Fair Remote;   Fair  Judgement:   Improving   Insight:   improving  Psychomotor Activity:  improving, but slightly improved and more visible in  milieu  Concentration:  Good  Recall:  Good  Fund of Knowledge:Good  Language: Good  Akathisia:  Negative  Handed:  Right  AIMS (if indicated):     Assets:  Communication Skills  ADL's:  Intact  Cognition: WNL  Sleep:  Number of Hours: 5     Current Medications: Current Facility-Administered Medications  Medication Dose Route Frequency Provider Last Rate Last Dose  . acetaminophen (TYLENOL) tablet 650 mg  650 mg Oral Q6H PRN Jomarie Longs, MD   650 mg at 11/10/14 0826  . alum & mag hydroxide-simeth (MAALOX/MYLANTA) 200-200-20 MG/5ML suspension 30 mL  30 mL Oral Q4H PRN Kaian Fahs, MD      . ARIPiprazole (ABILIFY) tablet 2 mg  2 mg Oral Daily Craige Cotta, MD   2 mg at 11/11/14 0820  . diazepam (VALIUM) tablet 2 mg  2 mg Oral Q8H PRN Craige Cotta, MD   2 mg at 11/11/14 1610  . dicyclomine (BENTYL) tablet 20 mg  20 mg Oral Q8H PRN Thermon Leyland, NP   20 mg at 11/08/14 1217  . DULoxetine (CYMBALTA) DR capsule 60 mg  60 mg Oral Daily Craige Cotta, MD   60 mg at 11/11/14 0820  . hydrOXYzine (ATARAX/VISTARIL) tablet 50 mg  50 mg Oral TID PRN Worthy Flank, NP   50 mg at 11/10/14 0826  . ibuprofen (ADVIL,MOTRIN) tablet 600 mg  600 mg Oral Q6H PRN Adonis Brook, NP   600 mg at 11/11/14 0500  . magnesium hydroxide (MILK OF MAGNESIA) suspension 30 mL  30 mL Oral  Daily PRN Jomarie Longs, MD   30 mL at 11/07/14 1105  . nicotine (NICODERM CQ - dosed in mg/24 hours) patch 21 mg  21 mg Transdermal Daily Craige Cotta, MD   21 mg at 11/11/14 9604  . ondansetron (ZOFRAN-ODT) disintegrating tablet 4 mg  4 mg Oral Q8H PRN Thermon Leyland, NP   4 mg at 11/11/14 0500  . phenytoin (DILANTIN) ER capsule 200 mg  200 mg Oral BID Craige Cotta, MD   200 mg at 11/11/14 0820  . traZODone (DESYREL) tablet 50 mg  50 mg Oral QHS,MR X 1 Evanna Burkett,  NP   50 mg at 11/10/14 2306    Lab Results:  No results found for this or any previous visit (from the past 48 hour(s)).  Physical Findings: AIMS: Facial and Oral Movements Muscles of Facial Expression: None, normal Lips and Perioral Area: None, normal Jaw: None, normal Tongue: None, normal,Extremity Movements Upper (arms, wrists, hands, fingers): None, normal Lower (legs, knees, ankles, toes): None, normal, Trunk Movements Neck, shoulders, hips: None, normal, Overall Severity Severity of abnormal movements (highest score from questions above): None, normal Incapacitation due to abnormal movements: None, normal Patient's awareness of abnormal movements (rate only patient's report): No Awareness, Dental Status Current problems with teeth and/or dentures?: No Does patient usually wear dentures?: No  CIWA:  CIWA-Ar Total: 4 COWS:  COWS Total Score: 8   Assessment-  Patient is improving gradually .Will continue current medications and observe on the unit.   Treatment Plan Summary: Daily contact with patient to assess and evaluate symptoms and progress in treatment and Medication management  PLAN OF CARE:  Continue inpatient treatment. Continue to encourage group /milieu participation.  Continue  Cymbalta 60  mgrs QDAY initially for depression and anxiety.  Continue Hydroxyzine for management of Anxiety as needed . Continue Valium 2 mgrs Q 8 hours PRN Agitation / Anxiety. Discussed risk, addictive potential of  BZD medications. Continue Dilantin ER   200  mg  BID for history of seizures . Dilantin level reviewed in chart . Continue Abilify 2 mgrs QDAY as augmentation to address depression.  Continue Trazodone 50 mgrs QHS PRN Insomnia .  Will continue with current plan of care; no changes at this time.  Medical Decision Making:  Review of Psycho-Social Stressors (1), Review of Last Therapy Session (1), Independent Review of image, tracing or specimen (2) and Review of Medication Regimen & Side Effects (2)   Stacia Feazell,  MD  11/11/2014, 11:30 AM

## 2014-11-12 NOTE — Progress Notes (Signed)
Patient ID: Katherine Bond, female   DOB: Apr 15, 1975, 39 y.o.   MRN: [id number] Patient ID: Katherine Bond, female   DOB: 1975-04-07, 39 y.o.   MRN: [id number] [facility] MD Progress Note  11/12/2014 6:21 PM Katherine Bond  MRN:  [id number]   Subjective:  Patient today states " I feel better. I was in a very dark place when I came here. Now, I'm feeling a better. Hoping to get discharged either tomorrow or Monday.    Objective: Patient seen, chart reviewed, and discussed with staff. As discussed with staff,  Patient has been presenting with improvement of mood and affect, has better grooming and less nervous . Pt is encouraged by her boyfriend's support. Pt has been tolerating her medications well, especially she feels that the Valium might be helping a lot since it helps her anxiety and her seizures and wonders if she could be continued on it for seizures. However it was discussed with pt that - Valium is not a maintenance therapy for seizures and I have discussed the risks, as well as addictive potential of BZD medications. Pt today denies any SI, her thought pattern is improving.  Visible in milieu, less withdrawn. Per staff - pt continues to improve - denies any side effect concerns or compliance issues of medications.  Principal Problem: MDD (major depressive disorder), recurrent severe, without psychosis  Diagnosis:   Patient Active Problem List   Diagnosis Date Noted  . Panic disorder [F41.0] 11/05/2014  . Convulsion [R56.9]   . MDD (major depressive disorder), recurrent severe, without psychosis [F33.2]    Total Time spent with patient: 15 minutes  Past Medical History:  Past Medical History  Diagnosis Date  . Bipolar 1 disorder     Past Surgical History  Procedure Laterality Date  . Pancreaticoduodenectomy    . Cholecystectomy    . Abdominal hysterectomy      partial   . Dental surgery     Family History: History reviewed. No pertinent family history.  Social History:  History  Alcohol  Use  . Yes    Comment: 5th of vodka in two days     History  Drug Use No    Social History   Social History  . Marital Status: Divorced    Spouse Name: N/A  . Number of Children: N/A  . Years of Education: N/A   Social History Main Topics  . Smoking status: Current Every Day Smoker -- 1.00 packs/day for 15 years    Types: Cigarettes  . Smokeless tobacco: None  . Alcohol Use: Yes     Comment: 5th of vodka in two days  . Drug Use: No  . Sexual Activity: Yes   Other Topics Concern  . None   Social History Narrative   Additional History:    Sleep:  Improved   Appetite:  Fair   Musculoskeletal: Strength & Muscle Tone: within normal limits Gait & Station: normal Patient leans: N/A  Psychiatric Specialty Exam: Physical Exam  Constitutional: She is oriented to person, place, and time.  Neck: Normal range of motion.  Respiratory: Effort normal.  Musculoskeletal: Normal range of motion.  Neurological: She is alert and oriented to person, place, and time.  Psychiatric: Her speech is normal and behavior is normal. Cognition and memory are normal. She expresses impulsivity. She exhibits a depressed mood. She expresses suicidal ideation.    Review of Systems  Constitutional: Negative for malaise/fatigue.  Gastrointestinal: Negative for nausea, vomiting, abdominal pain and diarrhea.  Neurological: Positive  for seizures (history). Negative for dizziness, tremors and weakness.  Psychiatric/Behavioral: Positive for depression and substance abuse. Negative for suicidal ideas. The patient is nervous/anxious and has insomnia.   All other systems reviewed and are negative. complains of constipation, denies nausea or vomiting today  Blood pressure 90/67, pulse 91, temperature 98 F (36.7 C), temperature source Oral, resp. rate 16, height 5\' 3"  (1.6 m), weight 58.968 kg (130 lb).Body mass index is 23.03 kg/(m^2).  General Appearance: improved grooming  Eye Contact::  Good    Speech:  Clear and Coherent  Volume:  Decreased  Mood:   Still depressed, but states she is feeling  better   Affect: less constricted, smiles at times appropriately.   Thought Process:  Linear  Orientation:  Full (Time, Place, and Person)  Thought Content:  No hallucinations , no delusions, not internally preoccupied   Suicidal Thoughts:   Denies plan or intention of hurting self  Or of SI, and at this time contracts for safety on the unit   Homicidal Thoughts:  No  Memory:  Immediate;   Fair Recent;   Fair Remote;   Fair  Judgement:   Improving   Insight:   improving  Psychomotor Activity:  improving, but slightly improved and more visible in  milieu  Concentration:  Good  Recall:  Good  Fund of Knowledge:Good  Language: Good  Akathisia:  Negative  Handed:  Right  AIMS (if indicated):     Assets:  Communication Skills  ADL's:  Intact  Cognition: WNL  Sleep:  Number of Hours: 5   Current Medications: Current Facility-Administered Medications  Medication Dose Route Frequency Provider Last Rate Last Dose  . acetaminophen (TYLENOL) tablet 650 mg  650 mg Oral Q6H PRN Jomarie Longs, MD   650 mg at 11/12/14 0931  . alum & mag hydroxide-simeth (MAALOX/MYLANTA) 200-200-20 MG/5ML suspension 30 mL  30 mL Oral Q4H PRN Saramma Eappen, MD      . ARIPiprazole (ABILIFY) tablet 2 mg  2 mg Oral Daily Craige Cotta, MD   2 mg at 11/12/14 0820  . diazepam (VALIUM) tablet 2 mg  2 mg Oral Q8H PRN Craige Cotta, MD   2 mg at 11/12/14 1213  . dicyclomine (BENTYL) tablet 20 mg  20 mg Oral Q8H PRN Thermon Leyland, NP   20 mg at 11/08/14 1217  . DULoxetine (CYMBALTA) DR capsule 60 mg  60 mg Oral Daily Craige Cotta, MD   60 mg at 11/12/14 0820  . hydrOXYzine (ATARAX/VISTARIL) tablet 50 mg  50 mg Oral TID PRN Worthy Flank, NP   50 mg at 11/12/14 1631  . ibuprofen (ADVIL,MOTRIN) tablet 600 mg  600 mg Oral Q6H PRN Adonis Brook, NP   600 mg at 11/11/14 2019  . magnesium hydroxide (MILK OF  MAGNESIA) suspension 30 mL  30 mL Oral Daily PRN Jomarie Longs, MD   30 mL at 11/07/14 1105  . nicotine (NICODERM CQ - dosed in mg/24 hours) patch 21 mg  21 mg Transdermal Daily Craige Cotta, MD   21 mg at 11/12/14 9604  . ondansetron (ZOFRAN-ODT) disintegrating tablet 4 mg  4 mg Oral Q8H PRN Thermon Leyland, NP   4 mg at 11/11/14 0500  . phenytoin (DILANTIN) ER capsule 200 mg  200 mg Oral BID Craige Cotta, MD   200 mg at 11/12/14 1712  . traZODone (DESYREL) tablet 50 mg  50 mg Oral QHS,MR X 1 Kizzie Fantasia, NP  50 mg at 11/12/14 0049   Lab Results:  No results found for this or any previous visit (from the past 48 hour(s)).  Physical Findings: AIMS: Facial and Oral Movements Muscles of Facial Expression: None, normal Lips and Perioral Area: None, normal Jaw: None, normal Tongue: None, normal,Extremity Movements Upper (arms, wrists, hands, fingers): None, normal Lower (legs, knees, ankles, toes): None, normal, Trunk Movements Neck, shoulders, hips: None, normal, Overall Severity Severity of abnormal movements (highest score from questions above): None, normal Incapacitation due to abnormal movements: None, normal Patient's awareness of abnormal movements (rate only patient's report): No Awareness, Dental Status Current problems with teeth and/or dentures?: No Does patient usually wear dentures?: No  CIWA:  CIWA-Ar Total: 1 COWS:  COWS Total Score: 8   Assessment:  MDD (major depressive disorder), recurrent severe, without psychosis  Treatment Plan Summary: Daily contact with patient to assess and evaluate symptoms and progress in treatment and Medication management PLAN OF CARE:  Continue inpatient treatment. Continue to encourage group /milieu participation.  Continue  Cymbalta 60  mgrs QDAY initially for depression and anxiety.  Continue Hydroxyzine for management of Anxiety as needed . Continue Valium 2 mgrs Q 8 hours PRN Agitation / Anxiety. Discussed risk, addictive  potential of BZD medications. Continue Dilantin ER 200  mg  BID for history of seizures . Dilantin level reviewed in chart . Continue Abilify 2 mgrs QDAY as augmentation to address depression.  Continue Trazodone 50 mgrs QHS PRN Insomnia . Continue current plan of care.  Medical Decision Making:  Review of Psycho-Social Stressors (1), Review of Last Therapy Session (1), Independent Review of image, tracing or specimen (2) and Review of Medication Regimen & Side Effects (2)  Katherine Bond, PMHNP, FNP-BC 11/12/2014, 6:21 PM I agree with assessment and plan Madie Reno A. Dub Mikes, M.D.

## 2014-11-12 NOTE — [facility] Group Notes (Signed)
CSW was unable to hold group today due to patients being involved in another activity at group time.  Kehinde Totzke Grossman-Orr, LCSW 11/12/2014, 3:34 PM   

## 2014-11-12 NOTE — Progress Notes (Signed)
Nursing Progress Note: 7-7p  D- Mood is depressed and anxious,rates anxiety at 5/10." I'm anxious but I shouldn't be maybe I'm going to have a seizure." Affect is blunted and appropriate. Pt is able to contract for safety. Continues to have difficulty staying asleep pt reports waking up at 4a and is worried about moving to her new home. " I was feeling hopeless but know I feel like I have hope for the future ".   A - Observed pt interacting in group and in the milieu.Support and encouragement offered, safety maintained with q 15 minutes. Group discussion included identifying needs.  R-Contracts for safety and continues to follow treatment plan, working on learning new coping skills.

## 2014-11-12 NOTE — Progress Notes (Signed)
Psychoeducational Group Note  Date:  11/12/2014 Time: 1015  Group Topic/Focus:  Identifying Needs:   The focus of this group is to help patients identify their personal needs that have been historically problematic and identify healthy behaviors to address their needs.  Participation Level:  Active  Participation Quality:  Appropriate  Affect:  Appropriate  Cognitive:  Alert  Insight:  Engaged  Engagement in Group:  Engaged  Additional Comments:   11/12/2014,1:02 PM Reef Achterberg, Joie Bimler

## 2014-11-13 MED ORDER — TRAZODONE HCL 150 MG PO TABS
150.0000 mg | ORAL_TABLET | Freq: Every evening | ORAL | Status: DC | PRN
  Administered 2014-11-13: 150 mg via ORAL
  Filled 2014-11-13: qty 1
  Filled 2014-11-13: qty 6
  Filled 2014-11-13: qty 1
  Filled 2014-11-13: qty 6
  Filled 2014-11-13 (×3): qty 1

## 2014-11-13 NOTE — Progress Notes (Signed)
Psychoeducational Group Note  Date: 11/13/2014 Time:  0930 Group Topic/Focus:  Gratefulness:  The focus of this group is to help patients identify what two things they are most grateful for in their lives. What helps ground them and to center them on their work to their recovery.  Participation Level:  Active  Participation Quality:  Appropriate  Affect:  Appropriate  Cognitive:  Oriented  Insight:  Improving  Engagement in Group:  Engaged  Additional Comments:    Dajiah Kooi A  

## 2014-11-13 NOTE — Progress Notes (Signed)
Patient did attend the evening speaker AA meeting.  

## 2014-11-13 NOTE — [facility] Group Notes (Signed)
[facility] Group Notes:  (Clinical Social Work)  11/13/2014  1:15-2:15PM  Summary of Progress/Problems:   The main focus of today's process group was to   1)  discuss the importance of adding supports  2)  define health supports versus unhealthy supports  3)  identify the patient's current unhealthy supports and plan how to handle them  4)  Identify the patient's current healthy supports and plan what to add.  An emphasis was placed on using counselor, doctor, therapy groups, 12-step groups, and problem-specific support groups to expand supports.    The patient expressed full comprehension of the concepts presented, and agreed that there is a need to add more supports.  The patient stated her boyfriend and church family are her main supports.  She interacted quite a bit throughout group and was supportive of others.  She was curled up in chair, appeared to be very tired and almost asleep at times.  Type of Therapy:  Process Group with Motivational Interviewing  Participation Level:  Active  Participation Quality:  Attentive and Supportive  Affect:  Flat  Cognitive:  Oriented  Insight:  Developing/Improving  Engagement in Therapy:  Engaged  Modes of Intervention:   Education, Support and Processing, Activity  Ambrose Mantle, LCSW 11/13/2014

## 2014-11-13 NOTE — Progress Notes (Signed)
Writer has observed patient up in her room earlier during visitation with her boyfriend. She has been up in the dayroom and has voiced no complaints. She was compliant with her 2200 sleep medication and was medicated by M. Pincus Badder since Clinical research associate was off unit for an admission. Safety maintained on unit with 15 min checks.

## 2014-11-13 NOTE — Progress Notes (Signed)
D) Pt states that she want to be discharged tomorrow. "I have a new town home to move into and we are going to do it tomorrow. Also tomorrow is my birthday and I want to be home for that day. Is looking forward to celebrating it with her boyfriend. Rates her depression and hopelessness both at a 0 and her anxiety at a 7.  A) Given support, reassurance and praise along with encouragement/Provided with a 1:1 R) Denies SI and HI.

## 2014-11-13 NOTE — Progress Notes (Signed)
Psychoeducational Group Note  Date:  11/13/2014 Time:  1015  Group Topic/Focus:  Making Healthy Choices:   The focus of this group is to help patients identify negative/unhealthy choices they were using prior to admission and identify positive/healthier coping strategies to replace them upon discharge.  Participation Level:  Active  Participation Quality:  Appropriate  Affect:  Appropriate  Cognitive:  Appropriate  Insight:  Improving  Engagement in Group:  Engaged  Additional Comments:    Jenell Dobransky A 11/13/2014 

## 2014-11-13 NOTE — Progress Notes (Signed)
Patient ID: Katherine Bond, female   DOB: [birth year], 39 y.o.   MRN: [id number] Patient ID: Katherine Bond, female   DOB: 08-18-1975, 39 y.o.   MRN: [id number] Patient ID: Katherine Bond, female   DOB: May 23, 1975, 39 y.o.   MRN: [id number] [facility] MD Progress Note  11/13/2014 5:19 PM Katherine Bond  MRN:  [id number]   Subjective:  [name] states " I'm feeling a better. I'm ready to be discharged tomorrow. Things are going well at home.   Objective: Patient seen, chart reviewed, and discussed with staff. As discussed with staff,  Patient has been presenting with improvement of mood and affect, has better grooming and less nervous . Pt is encouraged by her boyfriend's support. Pt has been tolerating her medications well, especially she feels that the Valium might be helping a lot since it helps her anxiety and her seizures and wonders if she could be continued on it for seizures. However it was discussed with pt that - Valium is not a maintenance therapy for seizures and I have discussed the risks, as well as addictive potential of BZD medications. Pt today denies any SI, her thought pattern is improving.  Visible in milieu, less withdrawn. Per staff - pt continues to improve - denies any side effect concerns or compliance issues of medications.  Principal Problem: MDD (major depressive disorder), recurrent severe, without psychosis  Diagnosis:   Patient Active Problem List   Diagnosis Date Noted  . Panic disorder [F41.0] 11/05/2014  . Convulsion [R56.9]   . MDD (major depressive disorder), recurrent severe, without psychosis [F33.2]    Total Time spent with patient: 15 minutes  Past Medical History:  Past Medical History  Diagnosis Date  . Bipolar 1 disorder     Past Surgical History  Procedure Laterality Date  . Pancreaticoduodenectomy    . Cholecystectomy    . Abdominal hysterectomy      partial   . Dental surgery     Family History: History reviewed. No pertinent family history.  Social History:   History  Alcohol Use  . Yes    Comment: 5th of vodka in two days     History  Drug Use No    Social History   Social History  . Marital Status: Divorced    Spouse Name: N/A  . Number of Children: N/A  . Years of Education: N/A   Social History Main Topics  . Smoking status: Current Every Day Smoker -- 1.00 packs/day for 15 years    Types: Cigarettes  . Smokeless tobacco: None  . Alcohol Use: Yes     Comment: 5th of vodka in two days  . Drug Use: No  . Sexual Activity: Yes   Other Topics Concern  . None   Social History Narrative   Additional History:    Sleep:  Improved   Appetite:  Fair   Musculoskeletal: Strength & Muscle Tone: within normal limits Gait & Station: normal Patient leans: N/A  Psychiatric Specialty Exam: Physical Exam  Constitutional: She is oriented to person, place, and time.  Neck: Normal range of motion.  Respiratory: Effort normal.  Musculoskeletal: Normal range of motion.  Neurological: She is alert and oriented to person, place, and time.  Psychiatric: Her speech is normal and behavior is normal. Cognition and memory are normal. She expresses impulsivity. She exhibits a depressed mood. She expresses suicidal ideation.    Review of Systems  Constitutional: Negative for malaise/fatigue.  Gastrointestinal: Negative for nausea, vomiting, abdominal pain and diarrhea.  Neurological: Positive for seizures (history). Negative for dizziness, tremors and weakness.  Psychiatric/Behavioral: Positive for depression and substance abuse. Negative for suicidal ideas. The patient is nervous/anxious and has insomnia.   All other systems reviewed and are negative. complains of constipation, denies nausea or vomiting today  Blood pressure 109/79, pulse 82, temperature 98.5 F (36.9 C), temperature source Oral, resp. rate 16, height 5\' 3"  (1.6 m), weight 58.968 kg (130 lb).Body mass index is 23.03 kg/(m^2).  General Appearance: improved grooming  Eye  Contact::  Good   Speech:  Clear and Coherent  Volume:  Decreased  Mood:   Still depressed, but states she is feeling  better   Affect: less constricted, smiles at times appropriately.   Thought Process:  Linear  Orientation:  Full (Time, Place, and Person)  Thought Content:  No hallucinations , no delusions, not internally preoccupied   Suicidal Thoughts:   Denies plan or intention of hurting self  Or of SI, and at this time contracts for safety on the unit   Homicidal Thoughts:  No  Memory:  Immediate;   Fair Recent;   Fair Remote;   Fair  Judgement:   Improving   Insight:   improving  Psychomotor Activity:  improving, but slightly improved and more visible in  milieu  Concentration:  Good  Recall:  Good  Fund of Knowledge:Good  Language: Good  Akathisia:  Negative  Handed:  Right  AIMS (if indicated):     Assets:  Communication Skills  ADL's:  Intact  Cognition: WNL  Sleep:  Number of Hours: 5   Current Medications: Current Facility-Administered Medications  Medication Dose Route Frequency Provider Last Rate Last Dose  . acetaminophen (TYLENOL) tablet 650 mg  650 mg Oral Q6H PRN Jomarie Longs, MD   650 mg at 11/12/14 0931  . alum & mag hydroxide-simeth (MAALOX/MYLANTA) 200-200-20 MG/5ML suspension 30 mL  30 mL Oral Q4H PRN Saramma Eappen, MD      . ARIPiprazole (ABILIFY) tablet 2 mg  2 mg Oral Daily Craige Cotta, MD   2 mg at 11/13/14 0732  . diazepam (VALIUM) tablet 2 mg  2 mg Oral Q8H PRN Craige Cotta, MD   2 mg at 11/13/14 1424  . dicyclomine (BENTYL) tablet 20 mg  20 mg Oral Q8H PRN Thermon Leyland, NP   20 mg at 11/08/14 1217  . DULoxetine (CYMBALTA) DR capsule 60 mg  60 mg Oral Daily Craige Cotta, MD   60 mg at 11/13/14 0732  . hydrOXYzine (ATARAX/VISTARIL) tablet 50 mg  50 mg Oral TID PRN Worthy Flank, NP   50 mg at 11/13/14 1202  . ibuprofen (ADVIL,MOTRIN) tablet 600 mg  600 mg Oral Q6H PRN Adonis Brook, NP   600 mg at 11/13/14 1424  . magnesium  hydroxide (MILK OF MAGNESIA) suspension 30 mL  30 mL Oral Daily PRN Jomarie Longs, MD   30 mL at 11/07/14 1105  . nicotine (NICODERM CQ - dosed in mg/24 hours) patch 21 mg  21 mg Transdermal Daily Craige Cotta, MD   21 mg at 11/13/14 0736  . ondansetron (ZOFRAN-ODT) disintegrating tablet 4 mg  4 mg Oral Q8H PRN Thermon Leyland, NP   4 mg at 11/13/14 0732  . phenytoin (DILANTIN) ER capsule 200 mg  200 mg Oral BID Craige Cotta, MD   200 mg at 11/13/14 1652  . traZODone (DESYREL) tablet 50 mg  50 mg Oral QHS,MR X 1 Kizzie Fantasia, NP  50 mg at 11/12/14 2113   Lab Results:  No results found for this or any previous visit (from the past 48 hour(s)).  Physical Findings: AIMS: Facial and Oral Movements Muscles of Facial Expression: None, normal Lips and Perioral Area: None, normal Jaw: None, normal Tongue: None, normal,Extremity Movements Upper (arms, wrists, hands, fingers): None, normal Lower (legs, knees, ankles, toes): None, normal, Trunk Movements Neck, shoulders, hips: None, normal, Overall Severity Severity of abnormal movements (highest score from questions above): None, normal Incapacitation due to abnormal movements: None, normal Patient's awareness of abnormal movements (rate only patient's report): No Awareness, Dental Status Current problems with teeth and/or dentures?: No Does patient usually wear dentures?: No  CIWA:  CIWA-Ar Total: 1 COWS:  COWS Total Score: 8   Assessment:  MDD (major depressive disorder), recurrent severe, without psychosis  Treatment Plan Summary: Daily contact with patient to assess and evaluate symptoms and progress in treatment and Medication management PLAN OF CARE:  Continue inpatient treatment. Continue to encourage group /milieu participation.  Continue  Cymbalta 60  mgrs QDAY initially for depression and anxiety.  Continue Hydroxyzine for management of Anxiety as needed . Continue Valium 2 mgrs Q 8 hours PRN Agitation / Anxiety. Discussed  risk, addictive potential of BZD medications. Continue Dilantin ER 200  mg  BID for history of seizures . Dilantin level reviewed in chart . Continue Abilify 2 mgrs QDAY as augmentation to address depression.  IncreasedTrazodone to 150 mgrs QHS for Insomnia . Continue current plan of care. Possible discharge in am.  Medical Decision Making:  Review of Psycho-Social Stressors (1), Review of Last Therapy Session (1), Independent Review of image, tracing or specimen (2) and Review of Medication Regimen & Side Effects (2)  Sanjuana Kava, PMHNP, FNP-BC 11/13/2014, 5:19 PM I agree with assessment and plan Madie Reno A. Dub Mikes, M.D.

## 2014-11-13 NOTE — Progress Notes (Signed)
Writer spoke with patient at medication window when she requested a prn vistaril. Her boyfriend was in her room visiting and she reports that she will be discharging on tomorrow and looking forward to it. She reports that her birthday is on tomorrow and they plan to move into their home also. She reports feeling much better and feels ready for discharge. She denies si/hi/a/v hallucinations. Support and praise given, safety maintained on unit with 15 min checks.

## 2014-11-14 MED ORDER — ARIPIPRAZOLE 2 MG PO TABS: 2.0000 mg | ORAL_TABLET | Freq: Every day | ORAL | Status: DC

## 2014-11-14 MED ORDER — DULOXETINE HCL 60 MG PO CPEP: 60.0000 mg | ORAL_CAPSULE | Freq: Every day | ORAL | Status: DC

## 2014-11-14 MED ORDER — HYDROXYZINE HCL 50 MG PO TABS: 50.0000 mg | ORAL_TABLET | Freq: Three times a day (TID) | ORAL | Status: DC | PRN

## 2014-11-14 MED ORDER — DIAZEPAM 2 MG PO TABS: 2.0000 mg | ORAL_TABLET | Freq: Three times a day (TID) | ORAL | Status: DC | PRN

## 2014-11-14 MED ORDER — ACETAMINOPHEN 500 MG PO TABS: 500.0000 mg | ORAL_TABLET | Freq: Four times a day (QID) | ORAL | Status: DC | PRN

## 2014-11-14 MED ORDER — PHENYTOIN SODIUM EXTENDED 200 MG PO CAPS: 200.0000 mg | ORAL_CAPSULE | Freq: Two times a day (BID) | ORAL | Status: DC

## 2014-11-14 MED ORDER — NICOTINE 21 MG/24HR TD PT24: 21.0000 mg | MEDICATED_PATCH | Freq: Every day | TRANSDERMAL | Status: DC

## 2014-11-14 MED ORDER — TRAZODONE HCL 150 MG PO TABS: 150.0000 mg | ORAL_TABLET | Freq: Every evening | ORAL | Status: DC | PRN

## 2014-11-14 NOTE — [facility] Group Notes (Signed)
   [facility] LCSW Aftercare Discharge Planning Group Note  11/14/2014  8:45 AM   Participation Quality: Alert, Appropriate and Oriented  Mood/Affect: Appropriate  Depression Rating: 0  Anxiety Rating: 3  Thoughts of Suicide: Pt denies SI/HI  Will you contract for safety? Yes  Current AVH: Pt denies  Plan for Discharge/Comments: Pt attended discharge planning group and actively participated in group. CSW provided pt with today's workbook. Patient reports feeling "good" today and states that she feels ready to discharge home today. Patient plans to return home to follow up with outpatient services.  Transportation Means: Pt reports access to transportation  Supports: No supports mentioned at this time  Samuella Bruin, MSW, [employer] Clinical Social Worker [employer] [phone]

## 2014-11-14 NOTE — BHH Suicide Risk Assessment (Signed)
[facility] Discharge Suicide Risk Assessment   Demographic Factors:  Caucasian  Total Time spent with patient: 30 minutes  Musculoskeletal: Strength & Muscle Tone: within normal limits Gait & Station: normal Patient leans: N/A  Psychiatric Specialty Exam: Physical Exam  Review of Systems  Psychiatric/Behavioral: Negative for depression, suicidal ideas and hallucinations.  All other systems reviewed and are negative.   Blood pressure 107/76, pulse 74, temperature 97.6 F (36.4 C), temperature source Oral, resp. rate 16, height  (1.6 m), weight 58.968 kg (130 lb).Body mass index is 23.03 kg/(m^2).  General Appearance: Casual  Eye Contact::  Fair  Speech:  Clear and Coherent409  Volume:  Normal  Mood:  Euthymic  Affect:  Congruent  Thought Process:  Coherent  Orientation:  Full (Time, Place, and Person)  Thought Content:  WDL  Suicidal Thoughts:  No  Homicidal Thoughts:  No  Memory:  Immediate;   Fair Recent;   Fair Remote;   Fair  Judgement:  Fair  Insight:  Fair  Psychomotor Activity:  Normal  Concentration:  Fair  Recall:  [employer] of Knowledge:Fair  Language: Fair  Akathisia:  No  Handed:  Right  AIMS (if indicated):     Assets:  Communication Skills Desire for Improvement  Sleep:  Number of Hours: 5  Cognition: WNL  ADL's:  Intact   Have you used any form of tobacco in the last 30 days? (Cigarettes, Smokeless Tobacco, Cigars, and/or Pipes): Patient Refused Screening  Has this patient used any form of tobacco in the last 30 days? (Cigarettes, Smokeless Tobacco, Cigars, and/or Pipes) Yes, A prescription for an FDA-approved tobacco cessation medication was offered at discharge and the patient refused  Mental Status Per Nursing Assessment::   On Admission:  Suicidal ideation indicated by others, NA  Current Mental Status by Physician: pt denies SI/HI/AH/VH  Loss Factors: NA  Historical Factors: Impulsivity  Risk Reduction Factors:   Positive social  support  Continued Clinical Symptoms:  Previous Psychiatric Diagnoses and Treatments  Cognitive Features That Contribute To Risk:  Polarized thinking    Suicide Risk:  Minimal: No identifiable suicidal ideation.  Patients presenting with no risk factors but with morbid ruminations; may be classified as minimal risk based on the severity of the depressive symptoms  Principal Problem: MDD (major depressive disorder), recurrent severe, without psychosis Discharge Diagnoses:  Patient Active Problem List   Diagnosis Date Noted  . Panic disorder [F41.0] 11/05/2014  . Convulsion [R56.9]   . MDD (major depressive disorder), recurrent severe, without psychosis [F33.2]     Follow-up Information    Follow up with [facility].   Specialty:  Behavioral Health   Why:  Please walkin between 8am-3pm Monday-Friday to be seen by a doctor and a therapist. Please inform staff when you arrive that you are there for a hospital discharge appointment.   Contact information:   [address] [place] [address] [phone]       Plan Of Care/Follow-up recommendations:  Activity:  No restrictions Diet:  regular Tests:  as needed Other:  Follow up with after care  Is patient on multiple antipsychotic therapies at discharge:  No   Has Patient had three or more failed trials of antipsychotic monotherapy by history:  No  Recommended Plan for Multiple Antipsychotic Therapies: NA    Tally Mckinnon MD 11/14/2014, 9:54 AM

## 2014-11-14 NOTE — Progress Notes (Signed)
  [facility] Adult Case Management Discharge Plan :  Will you be returning to the same living situation after discharge:  Yes,  patient plans to return home At discharge, do you have transportation home?: Yes,  patient reports access to transportation Do you have the ability to pay for your medications: Yes,  patient will be provided with prescriptions at discharge  Release of information consent forms completed and in the chart;  Patient's signature needed at discharge.  Patient to Follow up at: Follow-up Information    Follow up with [facility].   Specialty:  Behavioral Health   Why:  Please walkin between 8am-3pm Monday-Friday to be seen by a doctor and a therapist. Please inform staff when you arrive that you are there for a hospital discharge appointment.   Contact information:   [address] [place] [address] [phone]       Patient denies SI/HI: Yes,  denies    Safety Planning and Suicide Prevention discussed: Yes,  with patient and boyfriend  Have you used any form of tobacco in the last 30 days? (Cigarettes, Smokeless Tobacco, Cigars, and/or Pipes): Patient Refused Screening  Has patient been referred to the Quitline?: N/A patient is not a smoker  Kit Brubacher L 11/14/2014, 10:09 AM

## 2014-11-14 NOTE — Discharge Summary (Signed)
Physician Discharge Summary Note  Patient:  Katherine Bond is an 39 y.o., female MRN:  [id number] DOB:  [birth year] Patient phone:  [phone] (home)  Patient address:   [address] [place] [address],  Total Time spent with patient: Greater than 30 minutes  Date of Admission:  11/05/2014  Date of Discharge: 11-14-14  Reason for Admission: Worsening symptoms of depression  Principal Problem: MDD (major depressive disorder), recurrent severe, without psychosis  Discharge Diagnoses: Patient Active Problem List   Diagnosis Date Noted  . Panic disorder [F41.0] 11/05/2014  . Convulsion [R56.9]   . MDD (major depressive disorder), recurrent severe, without psychosis [F33.2]    Musculoskeletal: Strength & Muscle Tone: within normal limits Gait & Station: normal Patient leans: N/A  Psychiatric Specialty Exam: Physical Exam  Psychiatric: Her speech is normal and behavior is normal. Judgment and thought content normal. Her mood appears not anxious. Her affect is not angry, not blunt, not labile and not inappropriate. Cognition and memory are normal. She does not exhibit a depressed mood.    Review of Systems  Constitutional: Negative.   HENT: Negative.   Eyes: Negative.   Respiratory: Negative.   Cardiovascular: Negative.   Gastrointestinal: Negative.   Genitourinary: Negative.   Musculoskeletal: Negative.   Skin: Negative.   Neurological: Negative.   Endo/Heme/Allergies: Negative.   Psychiatric/Behavioral: Positive for depression (Stable). Negative for suicidal ideas, hallucinations, memory loss and substance abuse. The patient has insomnia (Stable). The patient is not nervous/anxious.     Blood pressure 107/76, pulse 74, temperature 97.6 F (36.4 C), temperature source Oral, resp. rate 16, height 5\' 3"  (1.6 m), weight 58.968 kg (130 lb).Body mass index is 23.03 kg/(m^2).  See Md's SRA  Have you used any form of tobacco in the last 30 days? (Cigarettes, Smokeless Tobacco,  Cigars, and/or Pipes): Patient Refused Screening  Has this patient used any form of tobacco in the last 30 days? (Cigarettes, Smokeless Tobacco, Cigars, and/or Pipes): Yes, A prescription for an FDA-approved tobacco cessation medication was offered at discharge and the patient refused  Past Medical History:  Past Medical History  Diagnosis Date  . Bipolar 1 disorder     Past Surgical History  Procedure Laterality Date  . Pancreaticoduodenectomy    . Cholecystectomy    . Abdominal hysterectomy      partial   . Dental surgery     Family History: History reviewed. No pertinent family history.  Social History:  History  Alcohol Use  . Yes    Comment: 5th of vodka in two days     History  Drug Use No    Social History   Social History  . Marital Status: Divorced    Spouse Name: N/A  . Number of Children: N/A  . Years of Education: N/A   Social History Main Topics  . Smoking status: Current Every Day Smoker -- 1.00 packs/day for 15 years    Types: Cigarettes  . Smokeless tobacco: None  . Alcohol Use: Yes     Comment: 5th of vodka in two days  . Drug Use: No  . Sexual Activity: Yes   Other Topics Concern  . None   Social History Narrative   Risk to Self: Is patient at risk for suicide?: No What has been your use of drugs/alcohol within the last 12 months?: Pt binged on the day of the attempt Risk to Others: No Prior Inpatient Therapy: Yes Prior Outpatient Therapy: Yes  Level of Care:  OP  Hospital  Course:  Patient states that she doesn't remember much. "I have been depressed for a while." Patient states that she took an overdose with an intent to kill her self. States that her stressors are "My life sucks. I don't have a job, my kids don't want to see me. I just don't want to be here." Patient states that she is allergic to Morphine "In the hospital they told me it stopped me from breathing so I went and brought me some Morphine." Patient states that she  recently quit her job because she did not have transportation to get back and forth to work. States that her 3 children ages 53, 82 and 88 yr old are living with their father (divorced) and doesn't want to see her.States that she became homeless in April 2016 and has been living on and off with friends; states at this time she has no where to go. Patient states that she is not close to any of her family and has no one that is supportive other than her boyfriend. (whom she got into an argument with in the ED but doesn't remember why and he was asked to leave) states "we don't argue he is the only one that is usually there for me. I don't remember what happened. I know he was mad because I tried to kill myself."  Katherine Bond was admitted to the hospital with a blood alcohol level of 229 per toxicology tests reports & UDS test result positive for opioid. She was complaining of worsening symptoms of depression leading to an overdose in an attempt to kill herself. She presented familial issues, financial & job related problems as her stressors. She was in need of mood stabilization treatments. After evaluation of her symptoms, Katherine Bond was medicated and discharged on Valium 2 mg for anxiety, Abilify 2 mg for mood control/adjunct treatment for depression, Duloxetine 60 mg for depression, Hydroxyzine 50 mg for anxiety, Nicotine patch for nicotine dependence &Trazodone 150 mg Q bedtime for insomnia. She was resumed on all her pertinent home medications for her other pre-existing medical issues that she presented. She tolerated her treatment regimen without any significant adverse effects and or reactions reported. Katherine Bond was also enrolled and participated in the group counseling sessions being offered and held on this unit. She learned coping skills that should help her cope better after discharge to maintain mood stability.  Katherine Bond was motivated for recovery. She worked closely with the treatment team and case managers to  develop a discharge plan with appropriate goals to maintain mood stability. Coping skills, problem solving as well as relaxation therapies were also part of her unit programming. Katherine Bond's symptoms responded well to her treatment regimen. This is evidenced by her reports of improved mood & absence of suicidal ideations. Upon discharge she was in much improved condition than upon admission. Her symptoms were reported as significantly decreased or resolved completely. She currently denies any SIHI, AVH, delusional thoughts and or paranoia. she was motivated to continue taking medications with a goal of continued improvement in mental health. She will follow-up care at the [facility] clinic for medication management/routine psychiatric care. She was provided with all the pertinent information required to make this appointment without problems. Transportation per her arrangement.  Consults:  psychiatry  Significant Diagnostic Studies:  labs: CBC with diff, CMP, UDS, toxicology tests, U/A, results reviewed, stable  Discharge Vitals:   Blood pressure 107/76, pulse 74, temperature 97.6 F (36.4 C), temperature source Oral, resp. rate 16, height 5\' 3"  (1.6 m),  weight 58.968 kg (130 lb). Body mass index is 23.03 kg/(m^2). Lab Results:   No results found for this or any previous visit (from the past 72 hour(s)).  Physical Findings: AIMS: Facial and Oral Movements Muscles of Facial Expression: None, normal Lips and Perioral Area: None, normal Jaw: None, normal Tongue: None, normal,Extremity Movements Upper (arms, wrists, hands, fingers): None, normal Lower (legs, knees, ankles, toes): None, normal, Trunk Movements Neck, shoulders, hips: None, normal, Overall Severity Severity of abnormal movements (highest score from questions above): None, normal Incapacitation due to abnormal movements: None, normal Patient's awareness of abnormal movements (rate only patient's report): No Awareness, Dental Status Current  problems with teeth and/or dentures?: No Does patient usually wear dentures?: No  CIWA:  CIWA-Ar Total: 1 COWS:  COWS Total Score: 8  See Psychiatric Specialty Exam and Suicide Risk Assessment completed by Attending Physician prior to discharge.  Discharge destination:  Home  Is patient on multiple antipsychotic therapies at discharge:  No   Has Patient had three or more failed trials of antipsychotic monotherapy by history:  No  Recommended Plan for Multiple Antipsychotic Therapies: NA    Medication List    STOP taking these medications        famotidine 20 MG tablet  Commonly known as:  PEPCID     HYDROcodone-acetaminophen 7.5-325 mg/15 ml solution  Commonly known as:  HYCET     naproxen 500 MG tablet  Commonly known as:  NAPROSYN     pantoprazole 20 MG tablet  Commonly known as:  PROTONIX     ranitidine 150 MG tablet  Commonly known as:  ZANTAC      TAKE these medications      Indication   acetaminophen 500 MG tablet  Commonly known as:  TYLENOL  Take 1 tablet (500 mg total) by mouth every 6 (six) hours as needed for moderate pain.   Indication:  Pain     ARIPiprazole 2 MG tablet  Commonly known as:  ABILIFY  Take 1 tablet (2 mg total) by mouth daily. For mood control   Indication:  Mood control     diazepam 2 MG tablet  Commonly known as:  VALIUM  Take 1 tablet (2 mg total) by mouth every 8 (eight) hours as needed for anxiety.   Indication:  Feeling Anxious     DULoxetine 60 MG capsule  Commonly known as:  CYMBALTA  Take 1 capsule (60 mg total) by mouth daily. For depression   Indication:  Major Depressive Disorder     hydrOXYzine 50 MG tablet  Commonly known as:  ATARAX/VISTARIL  Take 1 tablet (50 mg total) by mouth 3 (three) times daily as needed for anxiety.   Indication:  Anxiety     nicotine 21 mg/24hr patch  Commonly known as:  NICODERM CQ - dosed in mg/24 hours  Place 1 patch (21 mg total) onto the skin daily. For smoking cessation    Indication:  Nicotine Addiction     phenytoin 200 MG ER capsule  Commonly known as:  DILANTIN  Take 1 capsule (200 mg total) by mouth 2 (two) times daily. For seizure activities   Indication:  Seizure     traZODone 150 MG tablet  Commonly known as:  DESYREL  Take 1 tablet (150 mg total) by mouth at bedtime and may repeat dose one time if needed. For insomnia   Indication:  Trouble Sleeping       Follow-up Information    Follow up with [facility].  Specialty:  Behavioral Health   Why:  Please walkin between 8am-3pm Monday-Friday to be seen by a doctor and a therapist. Please inform staff when you arrive that you are there for a hospital discharge appointment.   [address] [place] [address] [phone]      Follow-up recommendations: Activity:  As tolerated Diet: As recommended by your primary care doctor. Keep all scheduled follow-up appointments as recommended.   Comments: Take all your medications as prescribed by your mental healthcare provider. Report any adverse effects and or reactions from your medicines to your outpatient provider promptly. Patient is instructed and cautioned to not engage in alcohol and or illegal drug use while on prescription medicines. In the event of worsening symptoms, patient is instructed to call the crisis hotline, 911 and or go to the nearest ED for appropriate evaluation and treatment of symptoms. Follow-up with your primary care provider for your other medical issues, concerns and or health care needs.   Total Discharge Time: Greater than 30 minutes  Signed: Armandina Stammer I, Greater than 30 minutes 11/14/2014, 11:05 AM  I personally assessed the patient and formulated the plan Madie Reno A. Dub Mikes, M.D.

## 2014-11-14 NOTE — Progress Notes (Signed)
D) pt. D/c to care of self transported by boyfriend.  Pt. Denied SI/HI and denied a/V hallucinations.  Denied pain.  Pt. Reports readiness for d/c, with some anxiety about having to coordinate d/c transportation.  A) AVS Reviewed. Reviewed medications and provided prescriptions and samples.  Safety plan reviewed.  Belongings returned.  R) Escorted to lobby.  Pt. Expressed appreciation for care.

## 2014-12-03 ENCOUNTER — Emergency Department (HOSPITAL_COMMUNITY)
Admission: EM | Admit: 2014-12-03 | Discharge: 2014-12-04 | Disposition: A | Discharge status: Home / Self Care | Payer: 59 | Attending: Emergency Medicine | Admitting: Emergency Medicine

## 2014-12-03 ENCOUNTER — Emergency Department (HOSPITAL_COMMUNITY): Payer: PRIVATE HEALTH INSURANCE

## 2014-12-03 ENCOUNTER — Encounter (HOSPITAL_COMMUNITY): Payer: Self-pay | Admitting: Emergency Medicine

## 2014-12-03 DIAGNOSIS — F332 Major depressive disorder, recurrent severe without psychotic features: Secondary | ICD-10-CM | POA: Diagnosis not present

## 2014-12-03 DIAGNOSIS — R45851 Suicidal ideations: Secondary | ICD-10-CM

## 2014-12-03 DIAGNOSIS — Z79899 Other long term (current) drug therapy: Secondary | ICD-10-CM | POA: Insufficient documentation

## 2014-12-03 DIAGNOSIS — G40909 Epilepsy, unspecified, not intractable, without status epilepticus: Secondary | ICD-10-CM | POA: Insufficient documentation

## 2014-12-03 DIAGNOSIS — R51 Headache: Secondary | ICD-10-CM

## 2014-12-03 DIAGNOSIS — R519 Headache, unspecified: Secondary | ICD-10-CM

## 2014-12-03 DIAGNOSIS — F329 Major depressive disorder, single episode, unspecified: Secondary | ICD-10-CM | POA: Insufficient documentation

## 2014-12-03 HISTORY — DX: Epilepsy, unspecified, not intractable, without status epilepticus: G40.909

## 2014-12-03 HISTORY — DX: Major depressive disorder, single episode, unspecified: F32.9

## 2014-12-03 HISTORY — DX: Depression, unspecified: F32.A

## 2014-12-03 LAB — COMPREHENSIVE METABOLIC PANEL
ALT: 36 U/L (ref 14–54)
AST: 28 U/L (ref 15–41)
Albumin: 4.5 g/dL (ref 3.5–5.0)
Alkaline Phosphatase: 66 U/L (ref 38–126)
Anion gap: 9 (ref 5–15)
BILIRUBIN TOTAL: 0.3 mg/dL (ref 0.3–1.2)
BUN: 8 mg/dL (ref 6–20)
CHLORIDE: 110 mmol/L (ref 101–111)
CO2: 23 mmol/L (ref 22–32)
Calcium: 8.9 mg/dL (ref 8.9–10.3)
Creatinine, Ser: 0.46 mg/dL (ref 0.44–1.00)
Glucose, Bld: 102 mg/dL — ABNORMAL HIGH (ref 65–99)
POTASSIUM: 3.8 mmol/L (ref 3.5–5.1)
Sodium: 142 mmol/L (ref 135–145)
TOTAL PROTEIN: 7.2 g/dL (ref 6.5–8.1)

## 2014-12-03 LAB — CBC
HCT: 41.5 % (ref 36.0–46.0)
Hemoglobin: 14.1 g/dL (ref 12.0–15.0)
MCH: 30 pg (ref 26.0–34.0)
MCHC: 34 g/dL (ref 30.0–36.0)
MCV: 88.3 fL (ref 78.0–100.0)
PLATELETS: 156 10*3/uL (ref 150–400)
RBC: 4.7 MIL/uL (ref 3.87–5.11)
RDW: 13.6 % (ref 11.5–15.5)
WBC: 7.8 10*3/uL (ref 4.0–10.5)

## 2014-12-03 LAB — RAPID URINE DRUG SCREEN, HOSP PERFORMED
AMPHETAMINES: NOT DETECTED
BENZODIAZEPINES: NOT DETECTED
Barbiturates: NOT DETECTED
Cocaine: NOT DETECTED
OPIATES: NOT DETECTED
Tetrahydrocannabinol: NOT DETECTED

## 2014-12-03 LAB — ACETAMINOPHEN LEVEL: Acetaminophen (Tylenol), Serum: <10 ug/mL — ABNORMAL LOW (ref 10–30)

## 2014-12-03 LAB — SALICYLATE LEVEL

## 2014-12-03 LAB — PHENYTOIN LEVEL, TOTAL

## 2014-12-03 LAB — ETHANOL: ALCOHOL ETHYL (B): 213 mg/dL — AB (ref <5)

## 2014-12-03 MED ORDER — IBUPROFEN 200 MG PO TABS: 600.0000 mg | ORAL_TABLET | Freq: Three times a day (TID) | ORAL | Status: DC | PRN

## 2014-12-03 MED ORDER — ARIPIPRAZOLE 2 MG PO TABS
2.0000 mg | ORAL_TABLET | Freq: Every day | ORAL | Status: DC
  Filled 2014-12-03 (×2): qty 1

## 2014-12-03 MED ORDER — LORAZEPAM 1 MG PO TABS
1.0000 mg | ORAL_TABLET | Freq: Three times a day (TID) | ORAL | Status: DC | PRN
  Administered 2014-12-03: 1 mg via ORAL
  Filled 2014-12-03 (×2): qty 1

## 2014-12-03 MED ORDER — PHENYTOIN SODIUM EXTENDED 100 MG PO CAPS
200.0000 mg | ORAL_CAPSULE | Freq: Two times a day (BID) | ORAL | Status: DC
  Administered 2014-12-03 – 2014-12-04 (×3): 200 mg via ORAL
  Filled 2014-12-03 (×3): qty 2

## 2014-12-03 MED ORDER — LORAZEPAM 2 MG/ML IJ SOLN
1.0000 mg | Freq: Once | INTRAMUSCULAR | Status: AC
  Administered 2014-12-03: 1 mg via INTRAMUSCULAR
  Filled 2014-12-03: qty 1

## 2014-12-03 MED ORDER — DULOXETINE HCL 60 MG PO CPEP
60.0000 mg | ORAL_CAPSULE | Freq: Every day | ORAL | Status: DC
  Administered 2014-12-03 – 2014-12-04 (×2): 60 mg via ORAL
  Filled 2014-12-03 (×2): qty 1

## 2014-12-03 MED ORDER — ACETAMINOPHEN 325 MG PO TABS
650.0000 mg | ORAL_TABLET | ORAL | Status: DC | PRN
  Administered 2014-12-03: 650 mg via ORAL
  Filled 2014-12-03: qty 2

## 2014-12-03 MED ORDER — TRAZODONE HCL 50 MG PO TABS
150.0000 mg | ORAL_TABLET | Freq: Every evening | ORAL | Status: DC | PRN
  Administered 2014-12-03: 150 mg via ORAL
  Filled 2014-12-03 (×2): qty 1

## 2014-12-03 NOTE — ED Notes (Signed)
Pt brought in by [employer]  Pt was in an argument with her boyfriend tonight   Pt has hx of depression and is currently taking medication for it  Pt made several comments in front of the police that she wants to die and asked the police to kill her  Pt repeated to police that she is dead already  Pt has several criminal issues pending

## 2014-12-03 NOTE — ED Notes (Signed)
Lab in to draw

## 2014-12-03 NOTE — ED Notes (Signed)
Back from CT

## 2014-12-03 NOTE — ED Provider Notes (Signed)
CSN: [id number]     Arrival date & time 12/03/14  0037 History   First MD Initiated Contact with Patient 12/03/14 0106     Chief Complaint  Patient presents with  . Suicidal     (Consider location/radiation/quality/duration/timing/severity/associated sxs/prior Treatment) HPI Comments: Patient is a 39 year old female with a past medical history of bipolar 1 disorder, depression, and epilepsy who presents with suicidal ideations. Patient reports she has not slept in 3 days and wants to die. She has no specific plan but told multiple police officers to "just kill me." No HI. She denies any drugs or alcohol use. Patient reports her cymbalta is not working and she is unable to find her abilify so she has not taken it.    Past Medical History  Diagnosis Date  . Bipolar 1 disorder   . Depression   . Epilepsy    Past Surgical History  Procedure Laterality Date  . Pancreaticoduodenectomy    . Cholecystectomy    . Abdominal hysterectomy      partial   . Dental surgery     History reviewed. No pertinent family history. Social History  Substance Use Topics  . Smoking status: Current Every Day Smoker -- 1.00 packs/day for 15 years    Types: Cigarettes  . Smokeless tobacco: None  . Alcohol Use: Yes     Comment: 5th of vodka in two days   OB History    No data available     Review of Systems  Psychiatric/Behavioral: Positive for suicidal ideas and dysphoric mood.  All other systems reviewed and are negative.     Allergies  Demerol and Morphine and related  Home Medications   Prior to Admission medications   Medication Sig Start Date End Date Taking? Authorizing Provider  acetaminophen (TYLENOL) 500 MG tablet Take 1 tablet (500 mg total) by mouth every 6 (six) hours as needed for moderate pain. 11/14/14  Yes Sanjuana Kava, NP  ARIPiprazole (ABILIFY) 2 MG tablet Take 1 tablet (2 mg total) by mouth daily. For mood control 11/14/14  Yes Sanjuana Kava, NP  DULoxetine (CYMBALTA) 60  MG capsule Take 1 capsule (60 mg total) by mouth daily. For depression 11/14/14  Yes Sanjuana Kava, NP  hydrOXYzine (ATARAX/VISTARIL) 50 MG tablet Take 1 tablet (50 mg total) by mouth 3 (three) times daily as needed for anxiety. 11/14/14  Yes Sanjuana Kava, NP  phenytoin (DILANTIN) 200 MG ER capsule Take 1 capsule (200 mg total) by mouth 2 (two) times daily. For seizure activities 11/14/14  Yes Sanjuana Kava, NP  traZODone (DESYREL) 150 MG tablet Take 1 tablet (150 mg total) by mouth at bedtime and may repeat dose one time if needed. For insomnia 11/14/14  Yes Sanjuana Kava, NP  diazepam (VALIUM) 2 MG tablet Take 1 tablet (2 mg total) by mouth every 8 (eight) hours as needed for anxiety. Patient not taking: Reported on 12/03/2014 11/14/14   Sanjuana Kava, NP  nicotine (NICODERM CQ - DOSED IN MG/24 HOURS) 21 mg/24hr patch Place 1 patch (21 mg total) onto the skin daily. For smoking cessation 11/14/14   Sanjuana Kava, NP   BP 114/85 mmHg  Pulse 91  Temp(Src) 98.3 F (36.8 C) (Oral)  Resp 15  SpO2 96% Physical Exam  Constitutional: She is oriented to person, place, and time. She appears well-developed and well-nourished. No distress.  HENT:  Head: Normocephalic and atraumatic.  Eyes: Conjunctivae and EOM are normal.  Neck:  Normal range of motion.  Cardiovascular: Normal rate and regular rhythm.  Exam reveals no gallop and no friction rub.   No murmur heard. Pulmonary/Chest: Effort normal and breath sounds normal. She has no wheezes. She has no rales. She exhibits no tenderness.  Abdominal: Soft. She exhibits no distension. There is no tenderness. There is no rebound.  Musculoskeletal: Normal range of motion.  Neurological: She is alert and oriented to person, place, and time. Coordination normal.  Speech is goal-oriented. Moves limbs without ataxia.   Skin: Skin is warm and dry.  Psychiatric:  Patient is tearful. Dysphoric mood.   Nursing note and vitals reviewed.   ED Course  Procedures  (including critical care time) Labs Review Labs Reviewed  COMPREHENSIVE METABOLIC PANEL - Abnormal; Notable for the following:    Glucose, Bld 102 (*)    All other components within normal limits  ETHANOL - Abnormal; Notable for the following:    Alcohol, Ethyl (B) 213 (*)    All other components within normal limits  ACETAMINOPHEN LEVEL - Abnormal; Notable for the following:    Acetaminophen (Tylenol), Serum <10 (*)    All other components within normal limits  SALICYLATE LEVEL  CBC  URINE RAPID DRUG SCREEN, HOSP PERFORMED    Imaging Review No results found. I have personally reviewed and evaluated these images and lab results as part of my medical decision-making.   EKG Interpretation None      MDM   Final diagnoses:  Suicidal ideation    2:11 AM Patient medically cleared and will be evaluated.    [address] [place], PA-C 12/03/14 1610  Loren Racer, MD 12/04/14 0430

## 2014-12-03 NOTE — ED Notes (Signed)
PA at bedside.

## 2014-12-03 NOTE — ED Notes (Signed)
Pt. Noted sleeping in room. No complaints or concerns voiced. No distress or abnormal behavior noted. Will continue to monitor with security cameras. Q 15 minute rounds continue. 

## 2014-12-03 NOTE — ED Notes (Signed)
Pt reports that she did get the rx filled, but can not find it.  Pt sts that she does not want to take any medications that she does not normally take.

## 2014-12-03 NOTE — ED Notes (Signed)
Pt is aware that she will be having the x ray.  Pt tearful, reports that she has "no one now...everybody has left me.Katherine KitchenMarland Bond"

## 2014-12-03 NOTE — BH Assessment (Addendum)
Tele Assessment Note   Katherine Bond is an 39 y.o. female, divorced, Caucasian who presents unaccompanied to [facility] ED after being transported by Patent examiner. Pt states she cannot remember exactly what happen this evening that lead to her coming to the hospital but repeatedly states, "I'm going crazy." Pt has a history of major depression and alcohol abuse and was inpatient at [facility] 11/05/14-11/14/14. Pt reports she had a conflict with her boyfriend and he called Patent examiner. Pt states "I don't feel like me" and states she has not slept in four days. She says she feels like she is going to have a seizure all the time. She says she believes she had a seizure last night because she urinated on herself. Pt says she feels confused and that her memory is impaired. She reports having a headache. She reports feeling suicidal and reportedly asked law enforcement to kill her. Pt denies homicidal ideation or history of violence but then also says she thinks she may have been violent with her boyfriend tonight. Pt reports she has been hearing "a radio" but denies hearing voices. Pt says she only drank one "Four Loko" tonight but Pt's blood alcohol is 213. Pt denies substance abuse and Pt's urine drug screen is negative.  Pt reports she does not have a permanent residence. She is currently staying with her boyfriend and his mother. Patient states that she recently quit her job because she did not have transportation to get back and forth to work. States that her 3 children ages 53, 62 and 68 yr old are living with their father (divorced) and doesn't want to see her.States that she became homeless in April 2016 and has been living on and off with friends; states at this time she has no where to go. Patient states that she is not close to any of her family and has no one that is supportive other than her boyfriend. Pt states she has missed some doses of her medications and has not followed up with [facility]  for outpatient treatment.  Pt is dressed in hospital scrubs, alert, oriented x4 with normal speech and restless motor behavior. Pt is holding her hand to her head. Eye contact is fair and Pt is tearful. Pt's mood is depressed and anxious and affect is congruent with mood. Thought process is coherent and relevant. There is no indication Pt is currently responding to internal stimuli or experiencing delusional thought content. Pt was generally cooperative during assessment. She states she doesn't want to be admitted to a psychiatric hospital but feels it is necessary. She is requesting medication that will make her sleep.  Axis I: Major Depressive Disorder, Alcohol Use Disorder, Severe Axis II: Deferred Axis III:  Past Medical History  Diagnosis Date  . Bipolar 1 disorder   . Depression   . Epilepsy    Axis IV: economic problems, housing problems, occupational problems, other psychosocial or environmental problems, problems related to legal system/crime and problems with primary support group Axis V: GAF=30  Past Medical History:  Past Medical History  Diagnosis Date  . Bipolar 1 disorder   . Depression   . Epilepsy     Past Surgical History  Procedure Laterality Date  . Pancreaticoduodenectomy    . Cholecystectomy    . Abdominal hysterectomy      partial   . Dental surgery      Family History: History reviewed. No pertinent family history.  Social History:  reports that she has been smoking Cigarettes.  She has a 15 pack-year smoking history. She does not have any smokeless tobacco history on file. She reports that she drinks alcohol. She reports that she does not use illicit drugs.  Additional Social History:  Alcohol / Drug Use Pain Medications: none Prescriptions: See MAR Over the Counter: Denies abuse History of alcohol / drug use?: No history of alcohol / drug abuse Longest period of sobriety (when/how long): Pt reports being sober for years at a time interrupted by  periods of binge drinking Negative Consequences of Use: Personal relationships Substance #1 Name of Substance 1: Etoh 1 - Age of First Use: Teens 1 - Amount (size/oz): One "Four Loco" 1 - Frequency: Occasional binges 1 - Duration: UTA 1 - Last Use / Amount: 12/02/14, one "Four Loco"  CIWA: CIWA-Ar BP: 114/85 mmHg Pulse Rate: 91 COWS:    PATIENT STRENGTHS: (choose at least two) Ability for insight Average or above average intelligence Capable of independent living Communication skills General fund of knowledge  Allergies:  Allergies  Allergen Reactions  . Demerol Anaphylaxis and Rash  . Morphine And Related Anaphylaxis and Rash    Home Medications:  (Not in a hospital admission)  OB/GYN Status:  No LMP recorded. Patient has had a hysterectomy.  General Assessment Data Location of Assessment: [facility] TTS Assessment: In system Is this a Tele or Face-to-Face Assessment?: Tele Assessment Is this an Initial Assessment or a Re-assessment for this encounter?: Initial Assessment Marital status: Divorced [place] name: [place] Is patient pregnant?: No Pregnancy Status: No Living Arrangements: Other (Comment) (Staying with boyfriend's mother) Can pt return to current living arrangement?: Yes Admission Status: Involuntary Is patient capable of signing voluntary admission?: Yes Referral Source: Self/Family/Friend Insurance type: Self-pay     Crisis Care Plan Living Arrangements: Other (Comment) (Staying with boyfriend's mother) Name of Psychiatrist: Transport planner Name of Therapist: [facility]  Education Status Is patient currently in school?: No Current Grade: NA Highest grade of school patient has completed: 12th Name of school: na Contact person: na  Risk to self with the past 6 months Suicidal Ideation: Yes-Currently Present Has patient been a risk to self within the past 6 months prior to admission? : Yes Suicidal Intent: No-Not Currently/Within Last 6 Months Has patient  had any suicidal intent within the past 6 months prior to admission? : Yes Is patient at risk for suicide?: Yes Suicidal Plan?: Yes-Currently Present Has patient had any suicidal plan within the past 6 months prior to admission? : Yes Specify Current Suicidal Plan: Pt asked police to kill her Access to Means: No Specify Access to Suicidal Means: Pt asked police to kill her What has been your use of drugs/alcohol within the last 12 months?: Pt is abusing alcohol Previous Attempts/Gestures: Yes How many times?: 2 Other Self Harm Risks: Alcohol abuse Triggers for Past Attempts: Unknown Intentional Self Injurious Behavior: None Family Suicide History: Unknown Recent stressful life event(s): Conflict (Comment), Job Loss, Financial Problems (Conflict with boyfriend) Persecutory voices/beliefs?: No Depression: Yes Depression Symptoms: Despondent, Insomnia, Tearfulness, Isolating, Fatigue, Guilt, Loss of interest in usual pleasures, Feeling worthless/self pity, Feeling angry/irritable Substance abuse history and/or treatment for substance abuse?: No Suicide prevention information given to non-admitted patients: Not applicable  Risk to Others within the past 6 months Homicidal Ideation: No Does patient have any lifetime risk of violence toward others beyond the six months prior to admission? : No Thoughts of Harm to Others: No Current Homicidal Intent: No Current Homicidal Plan: No Access to Homicidal Means: No Identified  Victim: None History of harm to others?: No Assessment of Violence: None Noted Violent Behavior Description: Pt thinks she might have been violent toward boyfriend tonight but she is not sure Does patient have access to weapons?: No Criminal Charges Pending?: Yes Describe Pending Criminal Charges: Possible legal charges Does patient have a court date: No Is patient on probation?: No  Psychosis Hallucinations: Auditory (Pt reports hearing a radio) Delusions: None  noted  Mental Status Report Appearance/Hygiene: In scrubs Eye Contact: Fair Motor Activity: Other (Comment) (Pt pressing her hand against her head) Speech: Logical/coherent Level of Consciousness: Crying Mood: Depressed, Anxious Affect: Depressed, Anxious Anxiety Level: Severe Thought Processes: Coherent, Relevant Judgement: Impaired Orientation: Person, Place, Time, Situation, Appropriate for developmental age Obsessive Compulsive Thoughts/Behaviors: None  Cognitive Functioning Concentration: Decreased Memory: Recent Impaired, Remote Impaired IQ: Average Insight: Poor Impulse Control: Poor Appetite: Good Weight Loss: 0 Weight Gain: 0 Sleep: Decreased Total Hours of Sleep: 0 Vegetative Symptoms: None  ADLScreening [facility] Assessment Services) Patient's cognitive ability adequate to safely complete daily activities?: Yes Patient able to express need for assistance with ADLs?: Yes Independently performs ADLs?: Yes (appropriate for developmental age)  Prior Inpatient Therapy Prior Inpatient Therapy: Yes Prior Therapy Dates: 11/05/14-11/14/14, 2012 Prior Therapy Facilty/Provider(s): [facility] Reason for Treatment: Suicidal ideation, alcohol abuse  Prior Outpatient Therapy Prior Outpatient Therapy: Yes Prior Therapy Dates: Current Prior Therapy Facilty/Provider(s): [facility] Reason for Treatment: Depression, alcohol abuse Does patient have an ACCT team?: No Does patient have Intensive In-House Services?  : No Does patient have [facility] services? : Yes Does patient have P4CC services?: No  ADL Screening (condition at time of admission) Patient's cognitive ability adequate to safely complete daily activities?: Yes Is the patient deaf or have difficulty hearing?: No Does the patient have difficulty seeing, even when wearing glasses/contacts?: No Does the patient have difficulty concentrating, remembering, or making decisions?: No Patient able to express need for assistance  with ADLs?: Yes Does the patient have difficulty dressing or bathing?: No Independently performs ADLs?: Yes (appropriate for developmental age) Does the patient have difficulty walking or climbing stairs?: No Weakness of Legs: None Weakness of Arms/Hands: None  Home Assistive Devices/Equipment Home Assistive Devices/Equipment: None    Abuse/Neglect Assessment (Assessment to be complete while patient is alone) Physical Abuse: Denies Verbal Abuse: Denies Sexual Abuse: Denies Exploitation of patient/patient's resources: Denies Self-Neglect: Denies     Merchant navy officer (For Healthcare) Does patient have an advance directive?: No Would patient like information on creating an advanced directive?: No - patient declined information    Additional Information 1:1 In Past 12 Months?: Yes CIRT Risk: No Elopement Risk: No Does patient have medical clearance?: Yes     Disposition: Binnie Rail, AC at [facility], confirms adult unit is currently at capacity. Gave clinical report to Hulan Fess, NP who said Pt meets criteria for inpatient psychiatric. TTS will contact other facilities for placement. Notified Emilia Beck, PA-C and Marita Snellen, RN of recommendation.  Disposition Initial Assessment Completed for this Encounter: Yes Disposition of Patient: Inpatient treatment program Type of inpatient treatment program: Adult ([facility] at capacity. TTS will contact other facilities )   Harlin Rain Patsy [place], [facility], [facility], [facility] Triage Specialist [phone]   Pamalee Leyden 12/03/2014 3:01 AM

## 2014-12-03 NOTE — ED Notes (Signed)
Pt reports that she has taken valium and that it helped with the figityness.  Pt also asking about having a head x-ray.  Will relay request.

## 2014-12-03 NOTE — Progress Notes (Signed)
Disposition CSW completed patient referrals to the following inpatient psych facilities:  [facility]  CSW will follow up with placement needs.  Seward Speck [facility] Disposition CSW [phone]

## 2014-12-03 NOTE — ED Notes (Signed)
Report received from Lizbeth Bark RN. Pt. Alert and oriented in no acute distress denies SI, HI, AVH and pain.  Pt. Tear full after phone call. Pt. Instructed to come to me with problems or concerns.Will continue to monitor for safety via security cameras and Q 15 minute checks.

## 2014-12-03 NOTE — ED Notes (Signed)
Pt. To [facility] from ED ambulatory without difficulty, to room 36 . Report from [facility]. Pt. Is alert and oriented, warm and dry in no acute distress. Pt. Is tearfull. Pt. Denies SI, HI, and AVH. Pt. Calm and cooperative. Pt. Made aware of security cameras and Q15 minute rounds. Pt. Encouraged to let Nursing staff know of any concerns or needs.

## 2014-12-03 NOTE — ED Notes (Signed)
Pt up to the desk.

## 2014-12-03 NOTE — ED Notes (Signed)
Pt to CT ambulatory

## 2014-12-03 NOTE — ED Notes (Signed)
Pt. Noted in room. No complaints or concerns voiced. No acute distress or abnormal behavior noted. Will continue to monitor with security cameras. Q 15 minute rounds continue. 

## 2014-12-03 NOTE — Consult Note (Signed)
Katherine Bond   Reason for Bond:  MDD, RECURRENT, SEVERE, Alcohol use disorder, Mild Referring Physician:  EDP Patient Identification: Katherine Bond MRN:  [id number] Principal Diagnosis: MDD (major depressive disorder), recurrent severe, without psychosis Diagnosis:   Patient Active Problem List   Diagnosis Date Noted  . MDD (major depressive disorder), recurrent severe, without psychosis [F33.2]     Priority: High  . Panic disorder [F41.0] 11/05/2014  . Convulsion [R56.9]     Total Time spent with patient: 1 hour  Subjective:   Katherine Bond is a 39 y.o. female patient admitted with  MDD, RECURRENT, SEVERE, Alcohol use disorder, Mild  HPI:  Caucasian female, 39 years old was evaluated for suicide ideations.  Patient was discharged from our [facility] 8/29 after treatment of Depression.  Patient states that she could not afford her medications and could not take any since her discharge.  Patient also states she has a diagnosis of Seizure disorder and that her Dilantin is not effective.  Patient is not taking her Dilantin because her Dilantin level is sub therapeutic.  Patient reports that she took a large bottle of beer with her boyfriend but her Alcohol level was 213 on arrival.  Patient was holding onto her head c/o headache.  She also stated she has had multiple falls as a result of unwitnessed Seizures.  Patient was tearful and stated that her boyfriend has asked her not to come back home after her treatment.  She reports poor sleep and appetite.  Patient reports previous hx of Alcoholism when she was drinking a fifth a day Vodka but states she is no longer drinking much.  She denies SI/HI/AVH today.  She has been accepted for admission and we will be seeking placement for her.  HPI Elements:   Location:  MDD, Recurrent, severe, no Psychosis, Suicide ideation. Quality:  severe. Severity:  severe. Timing:  Acute. Duration:  Chronic mental illness.. Context:  Seeking  treatment for depression..  Past Medical History:  Past Medical History  Diagnosis Date  . Bipolar 1 disorder   . Depression   . Epilepsy     Past Surgical History  Procedure Laterality Date  . Pancreaticoduodenectomy    . Cholecystectomy    . Abdominal hysterectomy      partial   . Dental surgery     Family History: History reviewed. No pertinent family history. Social History:  History  Alcohol Use  . Yes    Comment: 5th of vodka in two days     History  Drug Use No    Social History   Social History  . Marital Status: Divorced    Spouse Name: N/A  . Number of Children: N/A  . Years of Education: N/A   Social History Main Topics  . Smoking status: Current Every Day Smoker -- 1.00 packs/day for 15 years    Types: Cigarettes  . Smokeless tobacco: None  . Alcohol Use: Yes     Comment: 5th of vodka in two days  . Drug Use: No  . Sexual Activity: Yes   Other Topics Concern  . None   Social History Narrative   Additional Social History:    Pain Medications: none Prescriptions: See MAR Over the Counter: Denies abuse History of alcohol / drug use?: No history of alcohol / drug abuse Longest period of sobriety (when/how long): Pt reports being sober for years at a time interrupted by periods of binge drinking Negative Consequences of Use: Personal relationships Name of  Substance 1: Etoh 1 - Age of First Use: Teens 1 - Amount (size/oz): One "Four Loco" 1 - Frequency: Occasional binges 1 - Duration: UTA 1 - Last Use / Amount: 12/02/14, one "Four Loco"                   Allergies:   Allergies  Allergen Reactions  . Demerol Anaphylaxis and Rash  . Morphine And Related Anaphylaxis and Rash    Labs:  Results for orders placed or performed during the hospital encounter of 12/03/14 (from the past 48 hour(s))  Urine rapid drug screen (hosp performed) (Not at [facility])     Status: None   Collection Time: 12/03/14  1:07 AM  Result Value Ref Range    Opiates NONE DETECTED NONE DETECTED   Cocaine NONE DETECTED NONE DETECTED   Benzodiazepines NONE DETECTED NONE DETECTED   Amphetamines NONE DETECTED NONE DETECTED   Tetrahydrocannabinol NONE DETECTED NONE DETECTED   Barbiturates NONE DETECTED NONE DETECTED    Comment:        DRUG SCREEN FOR MEDICAL PURPOSES ONLY.  IF CONFIRMATION IS NEEDED FOR ANY PURPOSE, NOTIFY LAB WITHIN 5 DAYS.        LOWEST DETECTABLE LIMITS FOR URINE DRUG SCREEN Drug Class       Cutoff (ng/mL) Amphetamine      1000 Barbiturate      200 Benzodiazepine   093 Tricyclics       267 Opiates          300 Cocaine          300 THC              50   Comprehensive metabolic panel     Status: Abnormal   Collection Time: 12/03/14  1:36 AM  Result Value Ref Range   Sodium 142 135 - 145 mmol/L   Potassium 3.8 3.5 - 5.1 mmol/L   Chloride 110 101 - 111 mmol/L   CO2 23 22 - 32 mmol/L   Glucose, Bld 102 (H) 65 - 99 mg/dL   BUN 8 6 - 20 mg/dL   Creatinine, Ser 0.46 0.44 - 1.00 mg/dL   Calcium 8.9 8.9 - 10.3 mg/dL   Total Protein 7.2 6.5 - 8.1 g/dL   Albumin 4.5 3.5 - 5.0 g/dL   AST 28 15 - 41 U/L   ALT 36 14 - 54 U/L   Alkaline Phosphatase 66 38 - 126 U/L   Total Bilirubin 0.3 0.3 - 1.2 mg/dL   GFR calc non Af Amer >60 >60 mL/min   GFR calc Af Amer >60 >60 mL/min    Comment: (NOTE) The eGFR has been calculated using the CKD EPI equation. This calculation has not been validated in all clinical situations. eGFR's persistently <60 mL/min signify possible Chronic Kidney Disease.    Anion gap 9 5 - 15  Ethanol (ETOH)     Status: Abnormal   Collection Time: 12/03/14  1:36 AM  Result Value Ref Range   Alcohol, Ethyl (B) 213 (H) <5 mg/dL    Comment:        LOWEST DETECTABLE LIMIT FOR SERUM ALCOHOL IS 5 mg/dL FOR MEDICAL PURPOSES ONLY   Salicylate level     Status: None   Collection Time: 12/03/14  1:36 AM  Result Value Ref Range   Salicylate Lvl <1.2 2.8 - 30.0 mg/dL  Acetaminophen level     Status: Abnormal    Collection Time: 12/03/14  1:36 AM  Result Value Ref Range  Acetaminophen (Tylenol), Serum <10 (L) 10 - 30 ug/mL    Comment:        THERAPEUTIC CONCENTRATIONS VARY SIGNIFICANTLY. A RANGE OF 10-30 ug/mL MAY BE AN EFFECTIVE CONCENTRATION FOR MANY PATIENTS. HOWEVER, SOME ARE BEST TREATED AT CONCENTRATIONS OUTSIDE THIS RANGE. ACETAMINOPHEN CONCENTRATIONS >150 ug/mL AT 4 HOURS AFTER INGESTION AND >50 ug/mL AT 12 HOURS AFTER INGESTION ARE OFTEN ASSOCIATED WITH TOXIC REACTIONS.   CBC     Status: None   Collection Time: 12/03/14  1:36 AM  Result Value Ref Range   WBC 7.8 4.0 - 10.5 K/uL   RBC 4.70 3.87 - 5.11 MIL/uL   Hemoglobin 14.1 12.0 - 15.0 g/dL   HCT 41.5 36.0 - 46.0 %   MCV 88.3 78.0 - 100.0 fL   MCH 30.0 26.0 - 34.0 pg   MCHC 34.0 30.0 - 36.0 g/dL   RDW 13.6 11.5 - 15.5 %   Platelets 156 150 - 400 K/uL  Phenytoin level, total     Status: Abnormal   Collection Time: 12/03/14  8:30 AM  Result Value Ref Range   Phenytoin Lvl <2.5 (L) 10.0 - 20.0 ug/mL    Vitals: Blood pressure 125/85, pulse 71, temperature 98.6 F (37 C), temperature source Oral, resp. rate 16, SpO2 99 %.  Risk to Self: Suicidal Ideation: Yes-Currently Present Suicidal Intent: No-Not Currently/Within Last 6 Months Is patient at risk for suicide?: Yes Suicidal Plan?: Yes-Currently Present Specify Current Suicidal Plan: Pt asked police to kill her Access to Means: No Specify Access to Suicidal Means: Pt asked police to kill her What has been your use of drugs/alcohol within the last 12 months?: Pt is abusing alcohol How many times?: 2 Other Self Harm Risks: Alcohol abuse Triggers for Past Attempts: Unknown Intentional Self Injurious Behavior: None Risk to Others: Homicidal Ideation: No Thoughts of Harm to Others: No Current Homicidal Intent: No Current Homicidal Plan: No Access to Homicidal Means: No Identified Victim: None History of harm to others?: No Assessment of Violence: None  Noted Violent Behavior Description: Pt thinks she might have been violent toward boyfriend tonight but she is not sure Does patient have access to weapons?: No Criminal Charges Pending?: Yes Describe Pending Criminal Charges: Possible legal charges Does patient have a court date: No Prior Inpatient Therapy: Prior Inpatient Therapy: Yes Prior Therapy Dates: 11/05/14-11/14/14, 2012 Prior Therapy Facilty/Gurpreet Mariani(s): [facility] Reason for Treatment: Suicidal ideation, alcohol abuse Prior Outpatient Therapy: Prior Outpatient Therapy: Yes Prior Therapy Dates: Current Prior Therapy Facilty/Terriyah Westra(s): [facility] Reason for Treatment: Depression, alcohol abuse Does patient have an ACCT team?: No Does patient have Intensive In-House Services?  : No Does patient have [facility] services? : Yes Does patient have P4[facility] services?: No  Current Facility-Administered Medications  Medication Dose Route Frequency [place] Last Rate Last Dose  . acetaminophen (TYLENOL) tablet 650 mg  650 mg Oral Q4H PRN Alvina Chou, PA-C   650 mg at 12/03/14 1247  . ARIPiprazole (ABILIFY) tablet 2 mg  2 mg Oral Daily Malvin Johns, MD   2 mg at 12/03/14 1124  . DULoxetine (CYMBALTA) DR capsule 60 mg  60 mg Oral Daily Malvin Johns, MD   60 mg at 12/03/14 1123  . ibuprofen (ADVIL,MOTRIN) tablet 600 mg  600 mg Oral Q8H PRN Alvina Chou, PA-C      . LORazepam (ATIVAN) tablet 1 mg  1 mg Oral Q8H PRN Alvina Chou, PA-C   1 mg at 12/03/14 1247  . phenytoin (DILANTIN) ER capsule 200 mg  200 mg  Oral BID Malvin Johns, MD   200 mg at 12/03/14 1124  . traZODone (DESYREL) tablet 150 mg  150 mg Oral QHS,MR X 1 Malvin Johns, MD       Current Outpatient Prescriptions  Medication Sig Dispense Refill  . acetaminophen (TYLENOL) 500 MG tablet Take 1 tablet (500 mg total) by mouth every 6 (six) hours as needed for moderate pain. 30 tablet 0  . ARIPiprazole (ABILIFY) 2 MG tablet Take 1 tablet (2 mg total) by mouth daily. For mood  control 30 tablet 0  . DULoxetine (CYMBALTA) 60 MG capsule Take 1 capsule (60 mg total) by mouth daily. For depression 30 capsule 0  . hydrOXYzine (ATARAX/VISTARIL) 50 MG tablet Take 1 tablet (50 mg total) by mouth 3 (three) times daily as needed for anxiety. 45 tablet 0  . phenytoin (DILANTIN) 200 MG ER capsule Take 1 capsule (200 mg total) by mouth 2 (two) times daily. For seizure activities 20 capsule 0  . traZODone (DESYREL) 150 MG tablet Take 1 tablet (150 mg total) by mouth at bedtime and may repeat dose one time if needed. For insomnia 60 tablet 0  . diazepam (VALIUM) 2 MG tablet Take 1 tablet (2 mg total) by mouth every 8 (eight) hours as needed for anxiety. (Patient not taking: Reported on 12/03/2014) 10 tablet 0  . nicotine (NICODERM CQ - DOSED IN MG/24 HOURS) 21 mg/24hr patch Place 1 patch (21 mg total) onto the skin daily. For smoking cessation 28 patch 0    Musculoskeletal: Strength & Muscle Tone: within normal limits Gait & Station: normal Patient leans: N/A  Psychiatric Specialty Exam: Physical Exam  Review of Systems  Constitutional: Negative.   Skin: Negative.   Neurological: Positive for headaches (C/O SEVERE h/a, ct head negative).    Blood pressure 125/85, pulse 71, temperature 98.6 F (37 C), temperature source Oral, resp. rate 16, SpO2 99 %.There is no weight on file to calculate BMI.  General Appearance: Casual  Eye Contact::  Fair  Speech:  Clear and Coherent and Normal Rate  Volume:  Normal  Mood:  Angry, Anxious and Depressed  Affect:  Congruent, Depressed, Flat and Tearful  Thought Process:  Coherent, Goal Directed and Intact  Orientation:  Full (Time, Place, and Person)  Thought Content:  WDL  Suicidal Thoughts:  No  Homicidal Thoughts:  No  Memory:  Immediate;   Fair Recent;   Fair Remote;   Fair  Judgement:  Impaired  Insight:  Shallow  Psychomotor Activity:  Psychomotor Retardation  Concentration:  Fair  Recall:  NA  Fund of Knowledge:Fair   Language: Good  Akathisia:  NA  Handed:  Right  AIMS (if indicated):     Assets:  Desire for Improvement  ADL's:  Intact  Cognition: WNL  Sleep:      Medical Decision Making: Review of Psycho-Social Stressors (1)  Treatment Plan Summary: Daily contact with patient to assess and evaluate symptoms and progress in treatment and Medication management  Plan:  Resume all home medications.  Obtain ct head for c/o severe headache after several falls and trauma to head. Disposition: Admit and seek placement  Delfin Gant   PMHNP-BC 12/03/2014 3:34 PM  Patient seen face-to-face for psychiatric consultation and evaluation, case discussed with physician extender in treatment team and formulated treatment plan. Reviewed the information documented and agree with the treatment plan.  JONNALAGADDA,JANARDHAHA R. 12/03/2014 6:03 PM

## 2014-12-04 MED ORDER — ARIPIPRAZOLE 2 MG PO TABS: 2.0000 mg | ORAL_TABLET | Freq: Every day | ORAL | Status: DC

## 2014-12-04 MED ORDER — PHENYTOIN SODIUM EXTENDED 200 MG PO CAPS: 200.0000 mg | ORAL_CAPSULE | Freq: Two times a day (BID) | ORAL | Status: DC

## 2014-12-04 MED ORDER — DULOXETINE HCL 60 MG PO CPEP: 60.0000 mg | ORAL_CAPSULE | Freq: Every day | ORAL | Status: DC

## 2014-12-04 MED ORDER — TRAZODONE HCL 150 MG PO TABS: 150.0000 mg | ORAL_TABLET | Freq: Every evening | ORAL | Status: DC | PRN

## 2014-12-04 NOTE — ED Notes (Signed)
Up tot he bathroom to shower and change scrubs 

## 2014-12-04 NOTE — ED Notes (Signed)
Pt has been able to find a ride home and will be here shortly

## 2014-12-04 NOTE — BH Assessment (Signed)
12/04/14 1010. Met with pt for reassessment.  Pt was pleasant and alert. Pt reports that she feels better this morning and that she thinks that drinking alcohol along with taking her medication led to her coming to [facility] yesterday.  Pt denies SI/HI/AV currently.  She is now reporting that she no longer wants to be admitted to an inpt psych unit, as she and her boyfriend have just gotten an apartment and are working on getting it all set up.  Pt reports that she had taken her meds since the discharge from [facility] but had not gone to [facility] yet for any follow up appt for her meds.  She said she still has medication at home currently and was told she can walk in at [facility].  Informed pt that I will speak with MD regarding her desire to be discharged rather than admitted. Lurline Idol, LCSW

## 2014-12-04 NOTE — ED Notes (Signed)
Pt. Noted sleeping in room. No complaints or concerns voiced. No distress or abnormal behavior noted. Will continue to monitor with security cameras. Q 15 minute rounds continue. 

## 2014-12-04 NOTE — ED Notes (Signed)
On the phone 

## 2014-12-04 NOTE — ED Notes (Signed)
TTS in w/ pt 

## 2014-12-04 NOTE — Consult Note (Signed)
  Psychiatric Specialty Exam: Physical Exam  ROS  Blood pressure 126/71, pulse 81, temperature 98.7 F (37.1 C), temperature source Oral, resp. rate 19, SpO2 98 %.There is no weight on file to calculate BMI.  General Appearance: Casual and Fairly Groomed  Eye Contact::  Good  Speech:  Clear and Coherent and Normal Rate  Volume:  Normal  Mood:  Depressed  Affect:  Congruent and Depressed  Thought Process:  Coherent, Goal Directed and Intact  Orientation:  Full (Time, Place, and Person)  Thought Content:  WDL  Suicidal Thoughts:  No  Homicidal Thoughts:  No  Memory:  Immediate;   Good Recent;   Good Remote;   Good  Judgement:  Fair  Insight:  Fair  Psychomotor Activity:  Normal  Concentration:  Good  Recall:  NA  Fund of Knowledge:  Fair  Language:  Good  Akathisia:  NA  Handed:  Right  AIMS (if indicated):     Assets:  Desire for Improvement  ADL's:  Intact  Cognition:  WNL  Sleep:      Patient is awake, alert and oriented x3.  She denies headache at this time and denies dizziness.  Patient denies seizure activities since her arrival to the ER.  She also states that she is aware of the effect of Alcohol on her Mh and the danger of drinking with a hx of Seizures.  She has agreed to follow up with  MH provider at [facility] and to follow up with her Neurologist.  Patient does not want to take her Abilify because she gets more angry after taking it and has not been taking Abilify.  Abilify will be discontinued.  MDD (major depressive disorder), recurrent severe, without psychosis   Plan:  Discharge home, Follow up with the Psychiatrist at [facility] and your Neurologist  Katherine Bond   PMHNP-BC  Patient seen face-to-face for the psychiatric evaluation, case discussed with the physician extender and also treatment team. Formulated treatment plan and reviewed the information documented and agree with the treatment plan.  Katherine Bond,Katherine R. 12/04/2014 6:26 PM

## 2014-12-04 NOTE — ED Notes (Addendum)
Written dc instructions and prescriptions reviewed w/ pt.  Pt encouraged to take her medications as directed and call tomorrow to arrainge for follow up.  Pt denies si/hi/avh on dc and was instructed to return/seek treatment for any return of suicidal thoughts/urges.  Pt verbalized under standing.   Pt ambulatory to dc window w/ mHt, belongings returned after leaving the area.

## 2014-12-04 NOTE — BHH Suicide Risk Assessment (Cosign Needed)
Suicide Risk Assessment  Discharge Assessment   [facility] Discharge Suicide Risk Assessment   Demographic Factors:  Caucasian, Low socioeconomic status and Unemployed  Total Time spent with patient: 20 minutes  Musculoskeletal: Strength & Muscle Tone: within normal limits Gait & Station: normal Patient leans: N/A  Psychiatric Specialty Exam:     Blood pressure 126/71, pulse 81, temperature 98.7 F (37.1 C), temperature source Oral, resp. rate 19, SpO2 98 %.There is no weight on file to calculate BMI.  General Appearance: Casual and Fairly Groomed  Patent attorney:: Good  Speech: Clear and Coherent and Normal Rate  Volume: Normal  Mood: Depressed  Affect: Congruent and Depressed  Thought Process: Coherent, Goal Directed and Intact  Orientation: Full (Time, Place, and Person)  Thought Content: WDL  Suicidal Thoughts: No  Homicidal Thoughts: No  Memory: Immediate; Good Recent; Good Remote; Good  Judgement: Fair  Insight: Fair  Psychomotor Activity: Normal  Concentration: Good  Recall: NA  Fund of Knowledge: Fair  Language: Good  Akathisia: NA  Handed: Right  AIMS (if indicated):    Assets: Desire for Improvement  ADL's: Intact  Cognition: WNL  Sleep:               Has this patient used any form of tobacco in the last 30 days? (Cigarettes, Smokeless Tobacco, Cigars, and/or Pipes) Yes, A prescription for an FDA-approved tobacco cessation medication was offered at discharge and the patient refused  Mental Status Per Nursing Assessment::   On Admission:     Current Mental Status by Physician: NA  Loss Factors: Decrease in vocational status  Historical Factors: NA  Risk Reduction Factors:   Living with another person, especially a relative  Continued Clinical Symptoms:  Depression:   Insomnia Alcohol/Substance Abuse/Dependencies  Cognitive Features That Contribute To Risk:  Polarized thinking     Suicide Risk:  Minimal: No identifiable suicidal ideation.  Patients presenting with no risk factors but with morbid ruminations; may be classified as minimal risk based on the severity of the depressive symptoms  Principal Problem: MDD (major depressive disorder), recurrent severe, without psychosis Discharge Diagnoses:  Patient Active Problem List   Diagnosis Date Noted  . MDD (major depressive disorder), recurrent severe, without psychosis [F33.2]     Priority: High  . Panic disorder [F41.0] 11/05/2014  . Convulsion [R56.9]       Plan Of Care/Follow-up recommendations:  Activity:  As tolerated Diet:  Regular  Is patient on multiple antipsychotic therapies at discharge:  No   Has Patient had three or more failed trials of antipsychotic monotherapy by history:  No  Recommended Plan for Multiple Antipsychotic Therapies: NA    Marq Rebello C   PMHNP-BC 12/04/2014, 12:28 PM

## 2015-04-26 ENCOUNTER — Emergency Department (HOSPITAL_COMMUNITY)
Admission: EM | Admit: 2015-04-26 | Discharge: 2015-04-26 | Disposition: A | Discharge status: Home / Self Care | Payer: Medicaid - Out of State | Attending: Emergency Medicine | Admitting: Emergency Medicine

## 2015-04-26 ENCOUNTER — Emergency Department (HOSPITAL_COMMUNITY): Payer: Medicaid - Out of State

## 2015-04-26 ENCOUNTER — Encounter (HOSPITAL_COMMUNITY): Payer: Self-pay

## 2015-04-26 DIAGNOSIS — F319 Bipolar disorder, unspecified: Secondary | ICD-10-CM | POA: Insufficient documentation

## 2015-04-26 DIAGNOSIS — W108XXA Fall (on) (from) other stairs and steps, initial encounter: Secondary | ICD-10-CM | POA: Insufficient documentation

## 2015-04-26 DIAGNOSIS — S3992XA Unspecified injury of lower back, initial encounter: Secondary | ICD-10-CM | POA: Diagnosis not present

## 2015-04-26 DIAGNOSIS — M436 Torticollis: Secondary | ICD-10-CM | POA: Insufficient documentation

## 2015-04-26 DIAGNOSIS — Y9289 Other specified places as the place of occurrence of the external cause: Secondary | ICD-10-CM | POA: Diagnosis not present

## 2015-04-26 DIAGNOSIS — Z760 Encounter for issue of repeat prescription: Secondary | ICD-10-CM | POA: Diagnosis not present

## 2015-04-26 DIAGNOSIS — Z79899 Other long term (current) drug therapy: Secondary | ICD-10-CM | POA: Diagnosis not present

## 2015-04-26 DIAGNOSIS — G40909 Epilepsy, unspecified, not intractable, without status epilepticus: Secondary | ICD-10-CM | POA: Insufficient documentation

## 2015-04-26 DIAGNOSIS — S199XXA Unspecified injury of neck, initial encounter: Secondary | ICD-10-CM | POA: Diagnosis present

## 2015-04-26 DIAGNOSIS — F17210 Nicotine dependence, cigarettes, uncomplicated: Secondary | ICD-10-CM | POA: Diagnosis not present

## 2015-04-26 DIAGNOSIS — Y9389 Activity, other specified: Secondary | ICD-10-CM | POA: Diagnosis not present

## 2015-04-26 DIAGNOSIS — Y998 Other external cause status: Secondary | ICD-10-CM | POA: Diagnosis not present

## 2015-04-26 MED ORDER — ACETAMINOPHEN 500 MG PO TABS
1000.0000 mg | ORAL_TABLET | Freq: Once | ORAL | Status: AC
  Administered 2015-04-26: 1000 mg via ORAL
  Filled 2015-04-26: qty 2

## 2015-04-26 MED ORDER — DULOXETINE HCL 60 MG PO CPEP: 60.0000 mg | ORAL_CAPSULE | Freq: Every day | ORAL | Status: DC

## 2015-04-26 MED ORDER — DIAZEPAM 5 MG PO TABS
5.0000 mg | ORAL_TABLET | Freq: Once | ORAL | Status: AC
  Administered 2015-04-26: 5 mg via ORAL
  Filled 2015-04-26: qty 1

## 2015-04-26 MED ORDER — IBUPROFEN 800 MG PO TABS
800.0000 mg | ORAL_TABLET | Freq: Once | ORAL | Status: AC
  Administered 2015-04-26: 800 mg via ORAL
  Filled 2015-04-26: qty 1

## 2015-04-26 MED ORDER — DIAZEPAM 5 MG PO TABS: 5.0000 mg | ORAL_TABLET | Freq: Four times a day (QID) | ORAL | Status: DC | PRN

## 2015-04-26 NOTE — Discharge Instructions (Signed)
Acute Torticollis °Torticollis is a condition in which the muscles of the neck tighten (contract) abnormally, causing the neck to twist and the head to move into an unnatural position. Torticollis that develops suddenly is called acute torticollis. If torticollis becomes chronic and is left untreated, the face and neck can become deformed. °CAUSES °This condition may be caused by: °· Sleeping in an awkward position (common). °· Extending or twisting the neck muscles beyond their normal position. °· Infection. °In some cases, the cause may not be known. °SYMPTOMS °Symptoms of this condition include: °· An unnatural position of the head. °· Neck pain. °· A limited ability to move the neck. °· Twisting of the neck to one side. °DIAGNOSIS °This condition is diagnosed with a physical exam. You may also have imaging tests, such as an X-ray, CT scan, or MRI. °TREATMENT °Treatment for this condition involves trying to relax the neck muscles. It may include: °· Medicines or shots. °· Physical therapy. °· Surgery. This may be done in severe cases. °HOME CARE INSTRUCTIONS °· Take medicines only as directed by your health care provider. °· Do stretching exercises and massage your neck as directed by your health care provider. °· Keep all follow-up visits as directed by your health care provider. This is important. °SEEK MEDICAL CARE IF: °· You develop a fever. °SEEK IMMEDIATE MEDICAL CARE IF: °· You develop difficulty breathing. °· You develop noisy breathing (stridor). °· You start drooling. °· You have trouble swallowing or have pain with swallowing. °· You develop numbness or weakness in your hands or feet. °· You have changes in your speech, understanding, or vision. °· Your pain gets worse. °  °This information is not intended to replace advice given to you by your health care provider. Make sure you discuss any questions you have with your health care provider. °  °Document Released: 03/01/2000 Document Revised:  07/19/2014 Document Reviewed: 02/28/2014 °Elsevier Interactive Patient Education ©2016 [employer] ° °

## 2015-04-26 NOTE — ED Provider Notes (Signed)
CSN: [id number]     Arrival date & time 04/26/15  1347 History   First MD Initiated Contact with Patient 04/26/15 1416     Chief Complaint  Patient presents with  . Medication Refill  . Back Pain  . Neck Pain     (Consider location/radiation/quality/duration/timing/severity/associated sxs/prior Treatment) Patient is a 40 y.o. female presenting with back pain and neck pain. The history is provided by the patient.  Back Pain Associated symptoms: no bladder incontinence, no bowel incontinence, no chest pain, no dysuria, no fever, no headaches, no leg pain, no numbness and no weakness   Neck Pain Pain location:  L side Quality:  Aching and cramping Pain radiates to:  Does not radiate Pain severity:  Moderate Onset quality:  Sudden Duration:  1 day Timing:  Constant Progression:  Worsening Chronicity:  New Context: fall (down 12 steps)   Relieved by:  Nothing Worsened by:  Nothing tried Ineffective treatments:  None tried Associated symptoms: no bladder incontinence, no bowel incontinence, no chest pain, no fever, no headaches, no leg pain, no numbness and no weakness    40 yo F with a chief complaint of neck pain. The started after she fell down about 12 steps. States is a mechanical fall. Unsure if she lost consciousness. Denies headache denies confusion. Has had a couple episodes of vomiting today that she is unable to explain. Denies abdominal pain leg pain. Patient is also concerned because she is running out of her home psychiatric medications. Called her physician who suggested she come here to get them refilled.  Past Medical History  Diagnosis Date  . Bipolar 1 disorder (HCC)   . Depression   . Epilepsy [facility])    Past Surgical History  Procedure Laterality Date  . Pancreaticoduodenectomy    . Cholecystectomy    . Abdominal hysterectomy      partial   . Dental surgery     Family History  Problem Relation Age of Onset  . Cancer Mother   . Cancer Father    Social  History  Substance Use Topics  . Smoking status: Current Some Day Smoker -- 1.00 packs/day for 15 years    Types: Cigarettes  . Smokeless tobacco: Never Used  . Alcohol Use: No   OB History    No data available     Review of Systems  Constitutional: Negative for fever and chills.  HENT: Negative for congestion and rhinorrhea.        Neck pain  Eyes: Negative for redness and visual disturbance.  Respiratory: Negative for shortness of breath and wheezing.   Cardiovascular: Negative for chest pain and palpitations.  Gastrointestinal: Negative for nausea, vomiting and bowel incontinence.  Genitourinary: Negative for bladder incontinence, dysuria and urgency.  Musculoskeletal: Positive for back pain and neck pain. Negative for myalgias and arthralgias.  Skin: Negative for pallor and wound.  Neurological: Negative for dizziness, weakness, numbness and headaches.      Allergies  Demerol and Morphine and related  Home Medications   Prior to Admission medications   Medication Sig Start Date End Date Taking? Authorizing Provider  diazepam (VALIUM) 2 MG tablet Take 2 mg by mouth every 6 (six) hours as needed for anxiety.   Yes Historical Provider, MD  diazepam (VALIUM) 5 MG tablet Take 1 tablet (5 mg total) by mouth every 6 (six) hours as needed for anxiety (spasms). 04/26/15   Melene Plan, DO  DULoxetine (CYMBALTA) 60 MG capsule Take 1 capsule (60 mg total) by  mouth daily. 04/26/15   Melene Plan, DO  nicotine (NICODERM CQ - DOSED IN MG/24 HOURS) 21 mg/24hr patch Place 1 patch (21 mg total) onto the skin daily. For smoking cessation Patient not taking: Reported on 04/26/2015 11/14/14   Sanjuana Kava, NP  phenytoin (DILANTIN) 200 MG ER capsule Take 1 capsule (200 mg total) by mouth 2 (two) times daily. 12/04/14   Earney Navy, NP  traZODone (DESYREL) 150 MG tablet Take 1 tablet (150 mg total) by mouth at bedtime as needed for sleep. 12/04/14   Earney Navy, NP   BP 136/95 mmHg  Pulse 77   Temp(Src) 97.9 F (36.6 C) (Oral)  Resp 16  Ht 5\' 4"  (1.626 m)  Wt 130 lb (58.968 kg)  BMI 22.30 kg/m2  SpO2 97% Physical Exam  Constitutional: She is oriented to person, place, and time. She appears well-developed and well-nourished. No distress.  HENT:  Head: Normocephalic and atraumatic.  Patient is tender about the paraspinal musculature of the neck worse than the left side. Patient has difficulty rotating to the left.  Eyes: EOM are normal. Pupils are equal, round, and reactive to light.  Neck: Normal range of motion. Neck supple.  Cardiovascular: Normal rate and regular rhythm.  Exam reveals no gallop and no friction rub.   No murmur heard. Pulmonary/Chest: Effort normal. She has no wheezes. She has no rales.  Abdominal: Soft. She exhibits no distension. There is no tenderness. There is no rebound and no guarding.  Musculoskeletal: She exhibits no edema or tenderness.  Neurological: She is alert and oriented to person, place, and time.  Skin: Skin is warm and dry. She is not diaphoretic.  Psychiatric: She has a normal mood and affect. Her behavior is normal.  Nursing note and vitals reviewed.   ED Course  Procedures (including critical care time) Labs Review Labs Reviewed - No data to display  Imaging Review No results found. I have personally reviewed and evaluated these images and lab results as part of my medical decision-making.   EKG Interpretation None      MDM   Final diagnoses:  Torticollis, acute    40 yo F with a chief complaint of neck pain. Most likely torticollis based on patient's history. Will obtain a CT scan due to the mechanism.  CT negative, d/c home.   I have discussed the diagnosis/risks/treatment options with the patient and believe the pt to be eligible for discharge home to follow-up with PCP. We also discussed returning to the ED immediately if new or worsening sx occur. We discussed the sx which are most concerning (e.g., sudden  worsening pain, fever, inability to tolerate by mouth) that necessitate immediate return. Medications administered to the patient during their visit and any new prescriptions provided to the patient are listed below.  Medications given during this visit Medications  ibuprofen (ADVIL,MOTRIN) tablet 800 mg (800 mg Oral Given 04/26/15 1436)  acetaminophen (TYLENOL) tablet 1,000 mg (1,000 mg Oral Given 04/26/15 1436)  diazepam (VALIUM) tablet 5 mg (5 mg Oral Given 04/26/15 1436)    Discharge Medication List as of 04/26/2015  3:48 PM    START taking these medications   Details  !! diazepam (VALIUM) 5 MG tablet Take 1 tablet (5 mg total) by mouth every 6 (six) hours as needed for anxiety (spasms)., Starting 04/26/2015, Until Discontinued, Print     !! - Potential duplicate medications found. Please discuss with provider.      The patient appears reasonably screen  and/or stabilized for discharge and I doubt any other medical condition or other [facility] requiring further screening, evaluation, or treatment in the ED at this time prior to discharge.    Melene Plan, DO 04/28/15 2024

## 2015-04-26 NOTE — ED Notes (Signed)
Patient reports that she ran out of her psych meds and was told by [facility] to get psych medications refilled. Patient states he next appointment is 06/15/15. Patient states she fell down 15-20 steps yesterday and landed on her neck. Patient c/o posterior neck pain and mid lower back pain.

## 2015-09-02 ENCOUNTER — Encounter (HOSPITAL_COMMUNITY): Payer: Self-pay | Admitting: *Deleted

## 2015-09-02 ENCOUNTER — Emergency Department (HOSPITAL_COMMUNITY): Payer: Self-pay

## 2015-09-02 DIAGNOSIS — Z79899 Other long term (current) drug therapy: Secondary | ICD-10-CM | POA: Insufficient documentation

## 2015-09-02 DIAGNOSIS — F17210 Nicotine dependence, cigarettes, uncomplicated: Secondary | ICD-10-CM | POA: Insufficient documentation

## 2015-09-02 DIAGNOSIS — R0789 Other chest pain: Secondary | ICD-10-CM | POA: Insufficient documentation

## 2015-09-02 DIAGNOSIS — R1011 Right upper quadrant pain: Secondary | ICD-10-CM | POA: Insufficient documentation

## 2015-09-02 LAB — COMPREHENSIVE METABOLIC PANEL
ALBUMIN: 3.7 g/dL (ref 3.5–5.0)
ALT: 22 U/L (ref 14–54)
ANION GAP: 8 (ref 5–15)
AST: 17 U/L (ref 15–41)
Alkaline Phosphatase: 51 U/L (ref 38–126)
BUN: 18 mg/dL (ref 6–20)
CHLORIDE: 104 mmol/L (ref 101–111)
CO2: 25 mmol/L (ref 22–32)
Calcium: 9.3 mg/dL (ref 8.9–10.3)
Creatinine, Ser: 0.68 mg/dL (ref 0.44–1.00)
GFR calc Af Amer: >60 mL/min (ref >60)
GFR calc non Af Amer: >60 mL/min (ref >60)
GLUCOSE: 98 mg/dL (ref 65–99)
POTASSIUM: 4.1 mmol/L (ref 3.5–5.1)
SODIUM: 137 mmol/L (ref 135–145)
TOTAL PROTEIN: 5.9 g/dL — AB (ref 6.5–8.1)
Total Bilirubin: 0.3 mg/dL (ref 0.3–1.2)

## 2015-09-02 LAB — CBC
HEMATOCRIT: 41.3 % (ref 36.0–46.0)
HEMOGLOBIN: 13.5 g/dL (ref 12.0–15.0)
MCH: 29 pg (ref 26.0–34.0)
MCHC: 32.7 g/dL (ref 30.0–36.0)
MCV: 88.6 fL (ref 78.0–100.0)
Platelets: 187 10*3/uL (ref 150–400)
RBC: 4.66 MIL/uL (ref 3.87–5.11)
RDW: 13.5 % (ref 11.5–15.5)
WBC: 10.1 10*3/uL (ref 4.0–10.5)

## 2015-09-02 LAB — URINALYSIS, ROUTINE W REFLEX MICROSCOPIC
Bilirubin Urine: NEGATIVE
GLUCOSE, UA: NEGATIVE mg/dL
Hgb urine dipstick: NEGATIVE
Ketones, ur: NEGATIVE mg/dL
LEUKOCYTES UA: NEGATIVE
NITRITE: NEGATIVE
PH: 5.5 (ref 5.0–8.0)
PROTEIN: NEGATIVE mg/dL
SPECIFIC GRAVITY, URINE: 1.031 — AB (ref 1.005–1.030)

## 2015-09-02 LAB — I-STAT BETA HCG BLOOD, ED (MC, WL, AP ONLY)

## 2015-09-02 LAB — LIPASE, BLOOD: LIPASE: 14 U/L (ref 11–51)

## 2015-09-02 MED ORDER — OXYCODONE-ACETAMINOPHEN 5-325 MG PO TABS
1.0000 | ORAL_TABLET | Freq: Once | ORAL | Status: AC
  Administered 2015-09-02: 1 via ORAL
  Filled 2015-09-02: qty 1

## 2015-09-02 MED ORDER — ONDANSETRON 4 MG PO TBDP
8.0000 mg | ORAL_TABLET | Freq: Once | ORAL | Status: AC
  Administered 2015-09-02: 8 mg via ORAL
  Filled 2015-09-02: qty 2

## 2015-09-02 NOTE — ED Notes (Signed)
The pt reports that she can take oxycodone  With no problem

## 2015-09-02 NOTE — ED Notes (Signed)
Two days ago the pt had a seizure and struck her rt rib area on something.  Since then she has had increasing pain and she thinks its getting worse  lmp  none

## 2015-09-03 ENCOUNTER — Emergency Department (HOSPITAL_COMMUNITY)
Admission: EM | Admit: 2015-09-03 | Discharge: 2015-09-03 | Disposition: A | Discharge status: Home / Self Care | Payer: Self-pay | Attending: Emergency Medicine | Admitting: Emergency Medicine

## 2015-09-03 ENCOUNTER — Emergency Department (HOSPITAL_COMMUNITY): Payer: Self-pay

## 2015-09-03 DIAGNOSIS — R0789 Other chest pain: Secondary | ICD-10-CM

## 2015-09-03 MED ORDER — HYDROMORPHONE HCL 1 MG/ML IJ SOLN
1.0000 mg | Freq: Once | INTRAMUSCULAR | Status: AC
  Administered 2015-09-03: 1 mg via INTRAVENOUS
  Filled 2015-09-03: qty 1

## 2015-09-03 MED ORDER — OXYCODONE HCL 5 MG PO TABS
5.0000 mg | ORAL_TABLET | Freq: Once | ORAL | Status: AC
  Administered 2015-09-03: 5 mg via ORAL
  Filled 2015-09-03: qty 1

## 2015-09-03 MED ORDER — SODIUM CHLORIDE 0.9 % IV BOLUS (SEPSIS)
1000.0000 mL | Freq: Once | INTRAVENOUS | Status: AC
  Administered 2015-09-03: 1000 mL via INTRAVENOUS

## 2015-09-03 MED ORDER — KETOROLAC TROMETHAMINE 30 MG/ML IJ SOLN
30.0000 mg | Freq: Once | INTRAMUSCULAR | Status: AC
  Administered 2015-09-03: 30 mg via INTRAVENOUS
  Filled 2015-09-03: qty 1

## 2015-09-03 MED ORDER — ONDANSETRON HCL 4 MG/2ML IJ SOLN
4.0000 mg | Freq: Once | INTRAMUSCULAR | Status: AC
  Administered 2015-09-03: 4 mg via INTRAVENOUS
  Filled 2015-09-03: qty 2

## 2015-09-03 NOTE — ED Provider Notes (Signed)
CSN: [id number]     Arrival date & time 09/02/15  2120 History  By signing my name below, I, Katherine Bond, attest that this documentation has been prepared under the direction and in the presence of Katherine Planan Adriane Guglielmo, DO. Electronically Signed: Ronney LionSuzanne Bond, ED Scribe. 09/03/2015. 2:46 AM.    Chief Complaint  Patient presents with  . Abdominal Pain   Patient is a 40 y.o. female presenting with abdominal pain. The history is provided by the patient. No language interpreter was used.  Abdominal Pain Pain location:  RUQ Pain severity:  Severe Duration:  3 days Timing:  Constant Progression:  Worsening Chronicity:  New Context comment:  Following seizure Relieved by:  Position changes (standing) Worsened by:  Palpation, vomiting and position changes (sittiing) Ineffective treatments:  None tried Associated symptoms: vomiting   Associated symptoms: no fever and no hematuria     HPI Comments: Katherine Bond is a 40 y.o. female with a history of epilepsy, who presents to the Emergency Department complaining of constant, worsening, 10/10 RUQ pain that began 3 days ago after a seizure. She states she knew she was experiencing a seizure because she became aware that she was confused and had been vomiting constantly. She reports when her confusion had subsided, she noticed she had apparent pain over her right rib and assumed she had struck her right rib on something during her seizure, although she is unsure whether this occurred. She reports her pain felt like indigestion at first, before gradually worsening to a severe level over the following days. She states her pain feels "deep," rather than musculoskeletal. She states vomiting, sitting, and palpation exacerbates her pain. Standing alleviates it. She denies a history of kidney stones. Patient reports a history of cholecystectomy. 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 She denies a history of alcoholism. She  denies any known fever or hematuria.   Past Medical History  Diagnosis Date  . Bipolar 1 disorder (HCC)   . Depression   . Epilepsy [facility](HCC)    Past Surgical History  Procedure Laterality Date  . Pancreaticoduodenectomy    . Cholecystectomy    . Abdominal hysterectomy      partial   . Dental surgery     Family History  Problem Relation Age of Onset  . Cancer Mother   . Cancer Father    Social History  Substance Use Topics  . Smoking status: Current Some Day Smoker -- 1.00 packs/day for 15 years    Types: Cigarettes  . Smokeless tobacco: Never Used  . Alcohol Use: No   OB History    No data available     Review of Systems  Constitutional: Negative for fever.  Gastrointestinal: Positive for vomiting and abdominal pain.  Genitourinary: Negative for hematuria.  All other systems reviewed and are negative.     Allergies  Demerol and Morphine and related  Home Medications   Prior to Admission medications   Medication Sig Start Date End Date Taking? Authorizing Provider  diazepam (VALIUM) 2 MG tablet Take 2 mg by mouth every 6 (six) hours as needed for anxiety.    Historical Provider, MD  diazepam (VALIUM) 5 MG tablet Take 1 tablet (5 mg total) by mouth every 6 (six) hours as needed for anxiety (spasms). 04/26/15   Katherine Planan Katherine Seat, DO  DULoxetine (CYMBALTA) 60 MG capsule Take 1 capsule (60 mg total) by mouth daily. 04/26/15   Katherine Planan Katherine Willner, DO  nicotine (NICODERM CQ -  DOSED IN MG/24 HOURS) 21 mg/24hr patch Place 1 patch (21 mg total) onto the skin daily. For smoking cessation Patient not taking: Reported on 04/26/2015 11/14/14   Katherine Kava, NP  phenytoin (DILANTIN) 200 MG ER capsule Take 1 capsule (200 mg total) by mouth 2 (two) times daily. 12/04/14   Katherine Navy, NP  traZODone (DESYREL) 150 MG tablet Take 1 tablet (150 mg total) by mouth at bedtime as needed for sleep. 12/04/14   Katherine Navy, NP   BP 148/95 mmHg  Pulse 79  Temp(Src) 98.5 F (36.9 C) (Oral)  Resp 18   Ht  (1.626 m)  Wt 145 lb 1 oz (65.8 kg)  BMI 24.89 kg/m2  SpO2 100% Physical Exam  Constitutional: She is oriented to person, place, and time. She appears well-developed and well-nourished. No distress.  HENT:  Head: Normocephalic and atraumatic.  Eyes: Conjunctivae and EOM are normal.  Neck: Neck supple. No tracheal deviation present.  Cardiovascular: Normal rate.   Pulmonary/Chest: Effort normal. No respiratory distress.  Abdominal: There is tenderness. There is no CVA tenderness.  Large scar across RUQ and epigastrium. Tenderness across the RUQ.  No CVA tenderness.   Musculoskeletal: Normal range of motion.  Neurological: She is alert and oriented to person, place, and time.  Skin: Skin is warm and dry.  Psychiatric: She has a normal mood and affect. Her behavior is normal.  Nursing note and vitals reviewed.   ED Course  Procedures (including critical care time)  DIAGNOSTIC STUDIES: Oxygen Saturation is 100% on RA, normal by my interpretation.    COORDINATION OF CARE: 1:11 AM - Discussed treatment plan with pt at bedside which includes CT abdomen, and pain and nausea medication. Pt verbalized understanding and agreed to plan.   Labs Review Labs Reviewed  COMPREHENSIVE METABOLIC PANEL - Abnormal; Notable for the following:    Total Protein 5.9 (*)    All other components within normal limits  URINALYSIS, ROUTINE W REFLEX MICROSCOPIC (NOT AT [facility]) - Abnormal; Notable for the following:    Specific Gravity, Urine 1.031 (*)    All other components within normal limits  LIPASE, BLOOD  CBC  I-STAT BETA HCG BLOOD, ED ([facility], [facility], [facility] ONLY)    Imaging Review Dg Ribs Unilateral W/chest Right  09/02/2015  CLINICAL DATA:  Pain in the right lower ribs and abdomen for 3 days. Injury during a seizure. Shortness of breath. EXAM: RIGHT RIBS AND CHEST - 3+ VIEW COMPARISON:  Chest 03/25/2012 FINDINGS: Normal heart size and pulmonary vascularity. No focal airspace disease or  consolidation in the lungs. No blunting of costophrenic angles. No pneumothorax. Mediastinal contours appear intact. Right ribs appear intact. No acute fracture or focal bone lesion identified. Surgical clips in the right upper quadrant. IMPRESSION: No evidence of active pulmonary disease.  Negative right ribs. Electronically Signed   By: Burman Nieves M.D.   On: 09/02/2015 22:58   I have personally reviewed and evaluated these images and lab results as part of my medical decision-making.  MDM   Final diagnoses:  None    40 yo F With right flank pain. Going on for the past few days. Patient states it's sharp and severe. On my exam patient with exquisite tenderness in the right upper quadrant. Has had a cholecystectomy in the past. CT stone study is negative for nephrolithiasis or fracture. Patient had mild improvement of pain she is in the ED. We'll have her follow-up with her family doctor. 6:03 AM:  I have discussed the diagnosis/risks/treatment options with the patient and believe the pt to be eligible for discharge home to follow-up with PCP. We also discussed returning to the ED immediately if new or worsening sx occur. We discussed the sx which are most concerning (e.g., sudden worsening pain, fever, inability to tolerate by mouth) that necessitate immediate return. Medications administered to the patient during their visit and any new prescriptions provided to the patient are listed below.  Medications given during this visit Medications  ondansetron (ZOFRAN-ODT) disintegrating tablet 8 mg (8 mg Oral Given 09/02/15 2231)  oxyCODONE-acetaminophen (PERCOCET/ROXICET) 5-325 MG per tablet 1 tablet (1 tablet Oral Given 09/02/15 2232)  sodium chloride 0.9 % bolus 1,000 mL (0 mLs Intravenous Stopped 09/03/15 0541)  HYDROmorphone (DILAUDID) injection 1 mg (1 mg Intravenous Given 09/03/15 0122)  ondansetron (ZOFRAN) injection 4 mg (4 mg Intravenous Given 09/03/15 0122)  ketorolac (TORADOL) 30 MG/ML  injection 30 mg (30 mg Intravenous Given 09/03/15 0541)  oxyCODONE (Oxy IR/ROXICODONE) immediate release tablet 5 mg (5 mg Oral Given 09/03/15 0541)    New Prescriptions   No medications on file    The patient appears reasonably screen and/or stabilized for discharge and I doubt any other medical condition or other [facility] requiring further screening, evaluation, or treatment in the ED at this time prior to discharge.     I personally performed the services described in this documentation, which was scribed in my presence. The recorded information has been reviewed and is accurate.    Katherine Plan, DO 09/03/15 [phone]

## 2015-09-03 NOTE — Discharge Instructions (Signed)
Take 4 over the counter ibuprofen tablets 3 times a day or 2 over-the-counter naproxen tablets twice a day for pain. ° °Chest Wall Pain °Chest wall pain is pain in or around the bones and muscles of your chest. Sometimes, an injury causes this pain. Sometimes, the cause may not be known. This pain may take several weeks or longer to get better. °HOME CARE INSTRUCTIONS  °Pay attention to any changes in your symptoms. Take these actions to help with your pain:  °· Rest as told by your health care provider.   °· Avoid activities that cause pain. These include any activities that use your chest muscles or your abdominal and side muscles to lift heavy items.    °· If directed, apply ice to the painful area: °¨ Put ice in a plastic bag. °¨ Place a towel between your skin and the bag. °¨ Leave the ice on for 20 minutes, 2-3 times per day. °· Take over-the-counter and prescription medicines only as told by your health care provider. °· Do not use tobacco products, including cigarettes, chewing tobacco, and e-cigarettes. If you need help quitting, ask your health care provider. °· Keep all follow-up visits as told by your health care provider. This is important. °SEEK MEDICAL CARE IF: °· You have a fever. °· Your chest pain becomes worse. °· You have new symptoms. °SEEK IMMEDIATE MEDICAL CARE IF: °· You have nausea or vomiting. °· You feel sweaty or light-headed. °· You have a cough with phlegm (sputum) or you cough up blood. °· You develop shortness of breath. °  °This information is not intended to replace advice given to you by your health care provider. Make sure you discuss any questions you have with your health care provider. °  °Document Released: 03/04/2005 Document Revised: 11/23/2014 Document Reviewed: 05/30/2014 °Elsevier Interactive Patient Education ©2016 [employer] ° °

## 2015-09-06 ENCOUNTER — Emergency Department (HOSPITAL_COMMUNITY)
Admission: EM | Admit: 2015-09-06 | Discharge: 2015-09-07 | Disposition: A | Discharge status: Other Acute Inpatient Hospital | Payer: Medicaid - Out of State | Attending: Emergency Medicine | Admitting: Emergency Medicine

## 2015-09-06 ENCOUNTER — Encounter (HOSPITAL_COMMUNITY): Payer: Self-pay | Admitting: Emergency Medicine

## 2015-09-06 DIAGNOSIS — F319 Bipolar disorder, unspecified: Secondary | ICD-10-CM | POA: Insufficient documentation

## 2015-09-06 DIAGNOSIS — F329 Major depressive disorder, single episode, unspecified: Secondary | ICD-10-CM | POA: Diagnosis not present

## 2015-09-06 DIAGNOSIS — R45851 Suicidal ideations: Secondary | ICD-10-CM | POA: Diagnosis not present

## 2015-09-06 DIAGNOSIS — F17210 Nicotine dependence, cigarettes, uncomplicated: Secondary | ICD-10-CM | POA: Insufficient documentation

## 2015-09-06 LAB — CBC
HEMATOCRIT: 41.2 % (ref 36.0–46.0)
Hemoglobin: 13.8 g/dL (ref 12.0–15.0)
MCH: 30.1 pg (ref 26.0–34.0)
MCHC: 33.5 g/dL (ref 30.0–36.0)
MCV: 89.8 fL (ref 78.0–100.0)
PLATELETS: 193 10*3/uL (ref 150–400)
RBC: 4.59 MIL/uL (ref 3.87–5.11)
RDW: 13.9 % (ref 11.5–15.5)
WBC: 9.3 10*3/uL (ref 4.0–10.5)

## 2015-09-06 LAB — RAPID URINE DRUG SCREEN, HOSP PERFORMED
Amphetamines: NOT DETECTED
BARBITURATES: NOT DETECTED
Benzodiazepines: NOT DETECTED
COCAINE: NOT DETECTED
Opiates: NOT DETECTED
Tetrahydrocannabinol: NOT DETECTED

## 2015-09-06 LAB — COMPREHENSIVE METABOLIC PANEL
ALK PHOS: 49 U/L (ref 38–126)
ALT: 25 U/L (ref 14–54)
ANION GAP: 5 (ref 5–15)
AST: 20 U/L (ref 15–41)
Albumin: 4.1 g/dL (ref 3.5–5.0)
BILIRUBIN TOTAL: 0.5 mg/dL (ref 0.3–1.2)
BUN: 10 mg/dL (ref 6–20)
CALCIUM: 9 mg/dL (ref 8.9–10.3)
CO2: 27 mmol/L (ref 22–32)
Chloride: 106 mmol/L (ref 101–111)
Creatinine, Ser: 0.53 mg/dL (ref 0.44–1.00)
GLUCOSE: 85 mg/dL (ref 65–99)
Potassium: 4.4 mmol/L (ref 3.5–5.1)
Sodium: 138 mmol/L (ref 135–145)
TOTAL PROTEIN: 6.4 g/dL — AB (ref 6.5–8.1)

## 2015-09-06 LAB — ETHANOL

## 2015-09-06 LAB — ACETAMINOPHEN LEVEL

## 2015-09-06 LAB — SALICYLATE LEVEL

## 2015-09-06 MED ORDER — ACETAMINOPHEN 325 MG PO TABS: 650.0000 mg | ORAL_TABLET | ORAL | Status: DC | PRN

## 2015-09-06 MED ORDER — NICOTINE 21 MG/24HR TD PT24: 21.0000 mg | MEDICATED_PATCH | Freq: Every day | TRANSDERMAL | Status: DC

## 2015-09-06 MED ORDER — LORAZEPAM 1 MG PO TABS
1.0000 mg | ORAL_TABLET | Freq: Three times a day (TID) | ORAL | Status: DC | PRN
Start: 2015-09-06 — End: 2015-09-07
  Administered 2015-09-06: 1 mg via ORAL
  Filled 2015-09-06: qty 1

## 2015-09-06 MED ORDER — ONDANSETRON HCL 4 MG PO TABS
4.0000 mg | ORAL_TABLET | Freq: Three times a day (TID) | ORAL | Status: DC | PRN
Start: 2015-09-06 — End: 2015-09-07

## 2015-09-06 MED ORDER — IBUPROFEN 200 MG PO TABS: 600.0000 mg | ORAL_TABLET | Freq: Three times a day (TID) | ORAL | Status: DC | PRN

## 2015-09-06 MED ORDER — ALUM & MAG HYDROXIDE-SIMETH 200-200-20 MG/5ML PO SUSP: 30.0000 mL | ORAL | Status: DC | PRN

## 2015-09-06 NOTE — ED Provider Notes (Signed)
CSN: [id number]     Arrival date & time 09/06/15  1639 History   First MD Initiated Contact with Patient 09/06/15 1759     Chief Complaint  Patient presents with  . Suicidal     (Consider location/radiation/quality/duration/timing/severity/associated sxs/prior Treatment) Patient is a 40 y.o. female presenting with mental health disorder.  Mental Health Problem Presenting symptoms: suicidal thoughts   Presenting symptoms: no homicidal ideas, no paranoid behavior and no self mutilation   Degree of incapacity (severity):  Moderate Onset quality:  Gradual Duration:  2 days Timing:  Constant Progression:  Worsening   Past Medical History  Diagnosis Date  . Bipolar 1 disorder (HCC)   . Depression   . Epilepsy [facility])    Past Surgical History  Procedure Laterality Date  . Pancreaticoduodenectomy    . Cholecystectomy    . Abdominal hysterectomy      partial   . Dental surgery     Family History  Problem Relation Age of Onset  . Cancer Mother   . Cancer Father    Social History  Substance Use Topics  . Smoking status: Current Some Day Smoker -- 1.00 packs/day for 15 years    Types: Cigarettes  . Smokeless tobacco: Never Used  . Alcohol Use: No   OB History    No data available     Review of Systems  Psychiatric/Behavioral: Positive for suicidal ideas. Negative for homicidal ideas, self-injury and paranoia.  All other systems reviewed and are negative.     Allergies  Demerol and Morphine and related  Home Medications   Prior to Admission medications   Medication Sig Start Date End Date Taking? Authorizing Provider  diazepam (VALIUM) 5 MG tablet Take 1 tablet (5 mg total) by mouth every 6 (six) hours as needed for anxiety (spasms). Patient not taking: Reported on 09/03/2015 04/26/15   Melene Plan, DO  DULoxetine (CYMBALTA) 60 MG capsule Take 1 capsule (60 mg total) by mouth daily. Patient not taking: Reported on 09/03/2015 04/26/15   Melene Plan, DO  nicotine (NICODERM  CQ - DOSED IN MG/24 HOURS) 21 mg/24hr patch Place 1 patch (21 mg total) onto the skin daily. For smoking cessation Patient not taking: Reported on 04/26/2015 11/14/14   Sanjuana Kava, NP  phenytoin (DILANTIN) 200 MG ER capsule Take 1 capsule (200 mg total) by mouth 2 (two) times daily. Patient not taking: Reported on 09/03/2015 12/04/14   Earney Navy, NP  traZODone (DESYREL) 150 MG tablet Take 1 tablet (150 mg total) by mouth at bedtime as needed for sleep. Patient not taking: Reported on 09/03/2015 12/04/14   Earney Navy, NP   BP 132/86 mmHg  Pulse 63  Temp(Src) 99.1 F (37.3 C) (Oral)  Resp 16  Ht  (1.626 m)  Wt 145 lb (65.772 kg)  BMI 24.88 kg/m2  SpO2 99% Physical Exam  Constitutional: She appears well-developed and well-nourished.  HENT:  Head: Normocephalic and atraumatic.  Neck: Normal range of motion.  Cardiovascular: Normal rate and regular rhythm.   Pulmonary/Chest: No stridor. No respiratory distress.  Abdominal: She exhibits no distension.  Neurological: She is alert.  Skin: Skin is warm and dry.  Psychiatric: She expresses suicidal ideation. She expresses no homicidal ideation. She expresses suicidal plans. She expresses no homicidal plans.  Nursing note and vitals reviewed.   ED Course  Procedures (including critical care time) Labs Review Labs Reviewed  COMPREHENSIVE METABOLIC PANEL - Abnormal; Notable for the following:    Total Protein  6.4 (*)    All other components within normal limits  ACETAMINOPHEN LEVEL - Abnormal; Notable for the following:    Acetaminophen (Tylenol), Serum <10 (*)    All other components within normal limits  ETHANOL  SALICYLATE LEVEL  CBC  URINE RAPID DRUG SCREEN, HOSP PERFORMED    Imaging Review No results found. I have personally reviewed and evaluated these images and lab results as part of my medical decision-making.   EKG Interpretation None      MDM   Final diagnoses:  Suicidal ideation   Suicidal  with worsening depression, life stressors and h/o suicide attempt. Medically cleared. Will consult tts.      Marily MemosJason Tammra Pressman, MD 09/06/15 1950

## 2015-09-06 NOTE — ED Notes (Signed)
Officer Cox brought pt's cell phone and lighter both was placed in pt's locker #[id number]

## 2015-09-06 NOTE — ED Notes (Signed)
Bed: [id number] Expected date:  Expected time:  Means of arrival:  Comments: TR9

## 2015-09-06 NOTE — ED Notes (Signed)
Pt reports SI with plan to cut self. No HI or substance use. Has had multiple changes in her life that has made things worse.

## 2015-09-07 ENCOUNTER — Encounter (HOSPITAL_COMMUNITY): Payer: Self-pay

## 2015-09-07 ENCOUNTER — Inpatient Hospital Stay (HOSPITAL_COMMUNITY)
Admission: EM | Admit: 2015-09-07 | Discharge: 2015-09-19 | DRG: 885 | Disposition: A | Discharge status: Home / Self Care | Payer: Federal, State, Local not specified - Other | Source: Intra-hospital | Attending: Psychiatry | Admitting: Psychiatry

## 2015-09-07 DIAGNOSIS — R451 Restlessness and agitation: Secondary | ICD-10-CM | POA: Diagnosis not present

## 2015-09-07 DIAGNOSIS — F4310 Post-traumatic stress disorder, unspecified: Secondary | ICD-10-CM | POA: Diagnosis present

## 2015-09-07 DIAGNOSIS — F17210 Nicotine dependence, cigarettes, uncomplicated: Secondary | ICD-10-CM | POA: Diagnosis present

## 2015-09-07 DIAGNOSIS — G4700 Insomnia, unspecified: Secondary | ICD-10-CM | POA: Diagnosis present

## 2015-09-07 DIAGNOSIS — F410 Panic disorder [episodic paroxysmal anxiety] without agoraphobia: Secondary | ICD-10-CM | POA: Diagnosis present

## 2015-09-07 DIAGNOSIS — G40909 Epilepsy, unspecified, not intractable, without status epilepticus: Secondary | ICD-10-CM | POA: Diagnosis present

## 2015-09-07 DIAGNOSIS — F332 Major depressive disorder, recurrent severe without psychotic features: Secondary | ICD-10-CM | POA: Diagnosis present

## 2015-09-07 DIAGNOSIS — R45851 Suicidal ideations: Secondary | ICD-10-CM | POA: Diagnosis present

## 2015-09-07 DIAGNOSIS — F411 Generalized anxiety disorder: Secondary | ICD-10-CM | POA: Diagnosis present

## 2015-09-07 DIAGNOSIS — Z9119 Patient's noncompliance with other medical treatment and regimen: Secondary | ICD-10-CM | POA: Diagnosis not present

## 2015-09-07 DIAGNOSIS — Z809 Family history of malignant neoplasm, unspecified: Secondary | ICD-10-CM

## 2015-09-07 MED ORDER — DULOXETINE HCL 20 MG PO CPEP
20.0000 mg | ORAL_CAPSULE | Freq: Two times a day (BID) | ORAL | Status: DC
  Administered 2015-09-07: 20 mg via ORAL
  Filled 2015-09-07 (×5): qty 1

## 2015-09-07 MED ORDER — PHENYTOIN SODIUM EXTENDED 100 MG PO CAPS
200.0000 mg | ORAL_CAPSULE | Freq: Two times a day (BID) | ORAL | Status: DC
  Administered 2015-09-07 – 2015-09-19 (×25): 200 mg via ORAL
  Filled 2015-09-07 (×11): qty 2
  Filled 2015-09-07: qty 28
  Filled 2015-09-07 (×2): qty 2
  Filled 2015-09-07: qty 28
  Filled 2015-09-07 (×14): qty 2

## 2015-09-07 MED ORDER — TRAZODONE HCL 150 MG PO TABS
150.0000 mg | ORAL_TABLET | Freq: Every evening | ORAL | Status: DC | PRN
  Administered 2015-09-08 – 2015-09-17 (×10): 150 mg via ORAL
  Filled 2015-09-07 (×10): qty 1

## 2015-09-07 MED ORDER — HYDROXYZINE HCL 50 MG PO TABS
50.0000 mg | ORAL_TABLET | Freq: Once | ORAL | Status: AC | PRN
  Administered 2015-09-07: 50 mg via ORAL
  Filled 2015-09-07: qty 1

## 2015-09-07 MED ORDER — LAMOTRIGINE 25 MG PO TABS
50.0000 mg | ORAL_TABLET | Freq: Every day | ORAL | Status: DC
Start: 2015-09-07 — End: 2015-09-09
  Administered 2015-09-07 – 2015-09-09 (×3): 50 mg via ORAL
  Filled 2015-09-07 (×5): qty 2

## 2015-09-07 MED ORDER — ACETAMINOPHEN 325 MG PO TABS: 650.0000 mg | ORAL_TABLET | Freq: Four times a day (QID) | ORAL | Status: DC | PRN

## 2015-09-07 MED ORDER — ALUM & MAG HYDROXIDE-SIMETH 200-200-20 MG/5ML PO SUSP: 30.0000 mL | ORAL | Status: DC | PRN

## 2015-09-07 MED ORDER — QUETIAPINE FUMARATE 50 MG PO TABS
50.0000 mg | ORAL_TABLET | Freq: Every day | ORAL | Status: DC
  Administered 2015-09-08 – 2015-09-12 (×5): 50 mg via ORAL
  Filled 2015-09-07 (×8): qty 1

## 2015-09-07 MED ORDER — MAGNESIUM HYDROXIDE 400 MG/5ML PO SUSP: 30.0000 mL | Freq: Every day | ORAL | Status: DC | PRN

## 2015-09-07 MED ORDER — QUETIAPINE FUMARATE 25 MG PO TABS
25.0000 mg | ORAL_TABLET | Freq: Two times a day (BID) | ORAL | Status: DC
  Administered 2015-09-07 – 2015-09-13 (×12): 25 mg via ORAL
  Filled 2015-09-07 (×17): qty 1
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
## 2015-09-07 NOTE — Progress Notes (Signed)
DAR NOTE: Patient is sad irritable and depressed.  Denies auditory and visual hallucinations.  Reports suicidal thoughts on self inventory form but contracts for safety.  Rates depression at 10+, hopelessness at 10+, and anxiety at 10+.  Maintained on routine safety checks.  Medications given as prescribed.  Support and encouragement offered as needed.  Attended group and participated.  States goal for today is "find reason to live."  Patient remained in her room sleeping most of this shift.  Patient requested and received Vistaril 50 mg for complain of anxiety and agitation with good effect.

## 2015-09-07 NOTE — Progress Notes (Signed)
Patient ID: Katherine Bond, female   DOB: 29-Mar-1975, 40 y.o.   MRN: [id number] Adult Psychoeducational Group Note  Date:  09/07/2015 Time:  09:45am   Group Topic/Focus:  Self Care:   The focus of this group is to help patients understand the importance of self-care in order to improve or restore emotional, physical, spiritual, interpersonal, and financial health.  Participation Level:  Did Not Attend  Participation Quality:    Affect:   Cognitive:    Insight:  Engagement in Group:   Modes of Intervention:    Additional Comments:  Pt did not attend, pt in bed asleep.   Aurora Maskwyman, Tamkia Temples E 09/07/2015, 10:55 AM

## 2015-09-07 NOTE — [facility] Group Notes (Signed)
[facility] 09/07/2015 1:15pm  Type of Therapy: Mental Health Association Presentation  Pt did not attend, declined invitation.   Chad CordialLauren Carter, LCSWA 09/07/2015 1:48 PM

## 2015-09-07 NOTE — Tx Team (Signed)
Interdisciplinary Treatment Plan Update (Adult) Date: 09/07/2015   Date: 09/07/2015 8:25 AM  Progress in Treatment:  Attending groups: Pt is new to milieu, continuing to assess  Participating in groups: Pt is new to milieu, continuing to assess  Taking medication as prescribed: Yes  Tolerating medication: Yes  Family/Significant othe contact made: No, CSW assessing for appropriate contact  Patient understands diagnosis: Yes AEB seeking help for depression  Discussing patient identified problems/goals with staff: Yes  Medical problems stabilized or resolved: Yes  Denies suicidal/homicidal ideation: Recently admitted with SI Patient has not harmed self or Others: Yes   New problem(s) identified: None identified at this time.   Discharge Plan or Barriers: CSW will assess for appropriate discharge plan and relevant barriers.   Additional comments:  Patient and CSW reviewed pt's identified goals and treatment plan. Patient verbalized understanding and agreed to treatment plan.   Reason for Continuation of Hospitalization:  Anxiety Depression Medication stabilization Suicidal ideation  Estimated length of stay: 3-5 days  Review of initial/current patient goals per problem list:   1.  Goal(s): Patient will participate in aftercare plan  Met:  No  Target date: 3-5 days from date of admission   As evidenced by: Patient will participate within aftercare plan AEB aftercare provider and housing plan at discharge being identified.  09/07/15: CSW to work with Pt to assess for appropriate discharge plan and faciliate appointments and referrals as needed prior to d/c.  2.  Goal (s): Patient will exhibit decreased depressive symptoms and suicidal ideations.  Met:  No  Target date: 3-5 days from date of admission   As evidenced by: Patient will utilize self rating of depression at 3 or below and demonstrate decreased signs of depression or be deemed stable for discharge by MD. 09/07/15: Pt  was admitted with symptoms of depression, rating 10/10. Pt continues to present with flat affect and depressive symptoms.  Pt will demonstrate decreased symptoms of depression and rate depression at 3/10 or lower prior to discharge.  3.  Goal(s): Patient will demonstrate decreased signs and symptoms of anxiety.  Met:  No  Target date: 3-5 days from date of admission   As evidenced by: Patient will utilize self rating of anxiety at 3 or below and demonstrated decreased signs of anxiety, or be deemed stable for discharge by MD 09/07/15: Pt was admitted with increased levels of anxiety and is currently rating those symptoms highly. Pt will demonstrated decreased symptoms of anxiety and rate it at 3/10 prior to d/c.  Attendees:  Patient:    Family:    Physician: Dr. Einar Grad, MD  09/07/2015 8:25 AM  Nursing: Brand Males, RN 09/07/2015 8:25 AM  Clinical Social Worker Peri Maris, Caro 09/07/2015 8:25 AM  Other: Erasmo Downer Drinkard, LCSWA 09/07/2015 8:25 AM  Clinical: Lars Pinks, RN Case manager  09/07/2015 8:25 AM  Other:  09/07/2015 8:25 AM  Other:     Peri Maris, Lead Social Work [phone]

## 2015-09-07 NOTE — Progress Notes (Signed)
Patient did not attend karaoke group tonight. 

## 2015-09-07 NOTE — [facility] Suicide Risk Assessment (Signed)
[facility]   Nursing information obtained from:    Demographic factors:   patient is a 40 year old Caucasian female with relationship issues. Current Mental Status:   patient is casually dressed and lying in bed. She was endorsing suicidal thoughts to cut on self with a razor blade. However she is able to contract for safety in the hospital. She presents with a flat affect and depressed mood. She has some insight and judgment. She denies any psychotic symptoms. Loss Factors:   loss of her relationship Historical Factors:   history off depression Risk Reduction Factors:   willing to get care  Total Time spent with patient: 20 minutes Principal Problem: <principal problem not specified> Diagnosis:   Patient Active Problem List   Diagnosis Date Noted  . Panic disorder [F41.0] 11/05/2014  . Convulsion (HCC) [R56.9]   . MDD (major depressive disorder), recurrent severe, without psychosis (HCC) [F33.2]    Subjective Data: Patient is a 40 year old Caucasian female with a history of depression presenting with suicidal thoughts to cut on self with a razor blade.  Continued Clinical Symptoms:  Alcohol Use Disorder Identification Test Final Score (AUDIT): 1 The "Alcohol Use Disorders Identification Test", Guidelines for Use in Primary Care, Second Edition.  World Science writerHealth Organization [facility](WHO). Score between 0-7:  no or low risk or alcohol related problems. Score between 8-15:  moderate risk of alcohol related problems. Score between 16-19:  high risk of alcohol related problems. Score 20 or above:  warrants further diagnostic evaluation for alcohol dependence and treatment.   CLINICAL FACTORS:   Depression:   Anhedonia Hopelessness Impulsivity Insomnia Severe   Musculoskeletal: Strength & Muscle Tone: within normal limits Gait & Station: normal Patient leans: N/A  Psychiatric Specialty Exam: Physical Exam  ROS  Blood pressure 124/72, pulse 67, temperature  97.8 F (36.6 C), temperature source Oral, resp. rate 18, height 5\' 4"  (1.626 m), weight 145 lb (65.772 kg).Body mass index is 24.88 kg/(m^2).  General Appearance: Disheveled  Eye Contact:  Minimal  Speech:  Slow  Volume:  Decreased  Mood:  Anxious and Depressed  Affect:  Constricted and Depressed  Thought Process:  Coherent  Orientation:  Full (Time, Place, and Person)  Thought Content:  Rumination  Suicidal Thoughts:  Yes.  with intent/plan  Homicidal Thoughts:  No  Memory:  Immediate;   Fair Recent;   Fair Remote;   Fair  Judgement:  Impaired  Insight:  Shallow  Psychomotor Activity:  Decreased  Concentration:  Concentration: Fair and Attention Span: Fair  Recall:  FiservFair  Fund of Knowledge:  Fair  Language:  Fair  Akathisia:  No  Handed:  Right  AIMS (if indicated):     Assets:  Communication Skills Desire for Improvement  ADL's:  Intact  Cognition:  WNL  Sleep:  Number of Hours: 0.75      COGNITIVE FEATURES THAT CONTRIBUTE TO RISK:  Loss of executive function and Thought constriction (tunnel vision)    SUICIDE RISK:   Moderate:  Frequent suicidal ideation with limited intensity, and duration, some specificity in terms of plans, no associated intent, good self-control, limited dysphoria/symptomatology, some risk factors present, and identifiable protective factors, including available and accessible social support.  PLAN OF CARE:  Provide supportive counseling Start medications as appropriate Patient to engage in group therapy and develop coping skills to deal with her current relationship stressors Consults needed Continue to monitor for mood and safety  I certify that inpatient services furnished can reasonably be expected  to improve the patient's condition.   Patrick NorthAVI, Ascencion Stegner, MD 09/07/2015, 10:31 AM
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
## 2015-09-07 NOTE — Tx Team (Signed)
Initial Interdisciplinary Treatment Plan   PATIENT STRESSORS: Financial difficulties Marital or family conflict Medication change or noncompliance   PATIENT STRENGTHS: Capable of independent living General fund of knowledge Physical Health   PROBLEM LIST: Problem List/Patient Goals Date to be addressed Date deferred Reason deferred Estimated date of resolution  I want help with my depression "   I need to find a place to live"                                                       DISCHARGE CRITERIA:  Improved stabilization in mood, thinking, and/or behavior Need for constant or close observation no longer present Verbal commitment to aftercare and medication compliance  PRELIMINARY DISCHARGE PLAN: Attend aftercare/continuing care group Placement in alternative living arrangements  PATIENT/FAMIILY INVOLVEMENT: This treatment plan has been presented to and reviewed with the patient, Tobey BrideLisa Losurdo, and/or family member, .  The patient and family have been given the opportunity to ask questions and make suggestions.  Andrena Mewsuttall, Joshuajames Moehring J 09/07/2015, 4:46 AM
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
## 2015-09-07 NOTE — H&P (Signed)
Psychiatric Admission Assessment Adult  Patient Identification: Katherine Bond MRN:  [id number] Date of Evaluation:  09/07/2015 Chief Complaint:  Bipolar disorder Principal Diagnosis: MDD (major depressive disorder), recurrent severe, without psychosis ([place]) Diagnosis:   Patient Active Problem List   Diagnosis Date Noted  . MDD (major depressive disorder), recurrent severe, without psychosis ([place]) [F33.2]     Priority: High  . Panic disorder [F41.0] 11/05/2014  . Convulsion ([place]) [R56.9]    History of Present Illness:   Katherine Bond is an 40 y.o. Female  Was brought in by [employer]. Having suicidal ideations.  Today when she was seen in assessment, she stated that she lost her teeth in the commode and not she does not have teeth to look for a job, she can't afford new ones, her boyfriend kicked her out and her children don't want to have anything to do with her.   "I don't have much to live for.  I can't stand my life."  Upon admission, she had found a razor today and thought about using it to cut her jugular vein.  Patient admits to not be taking her medications since March "because they weren't working for me." She said that this included her Dilantin. She reports having one seizure either Monday or Tuesday of this week. Patient stopped going to her psychiatrist, Elbert Ewings in March also.  Patient denies any SA issues or use of illicit drugs. Her UDS is clear. Patient was at [facility] in August of 2016 and September of 2012.  Associated Signs/Symptoms: Depression Symptoms:  depressed mood, loss of energy/fatigue, (Hypo) Manic Symptoms:  Irritable Mood, Labiality of Mood, Anxiety Symptoms:  Excessive Worry, Psychotic Symptoms:  NA PTSD Symptoms: NA Total Time spent with patient: 30 minutes  Past Psychiatric History: see HPI  Is the patient at risk to self? Yes.    Has the patient been a risk to self in the past 6 months? Yes.    Has the patient been a risk to self within the distant  past? Yes.    Is the patient a risk to others? No.  Has the patient been a risk to others in the past 6 months? No.  Has the patient been a risk to others within the distant past? No.   Prior Inpatient Therapy:   Prior Outpatient Therapy:    Alcohol Screening: 1. How often do you have a drink containing alcohol?: Monthly or less 2. How many drinks containing alcohol do you have on a typical day when you are drinking?: 1 or 2 3. How often do you have six or more drinks on one occasion?: Never Preliminary Score: 0 9. Have you or someone else been injured as a result of your drinking?: No 10. Has a relative or friend or a doctor or another health worker been concerned about your drinking or suggested you cut down?: No Alcohol Use Disorder Identification Test Final Score (AUDIT): 1 Brief Intervention: AUDIT score less than 7 or less-screening does not suggest unhealthy drinking-brief intervention not indicated Substance Abuse History in the last 12 months:  Yes.   Consequences of Substance Abuse: inpatient crisis management Previous Psychotropic Medications: Yes  Psychological Evaluations: Yes  Past Medical History:  Past Medical History  Diagnosis Date  . Bipolar 1 disorder ([place])   . Depression   . Epilepsy [facility])     Past Surgical History  Procedure Laterality Date  . Pancreaticoduodenectomy    . Cholecystectomy    . Abdominal hysterectomy  partial   . Dental surgery     Family History:  Family History  Problem Relation Age of Onset  . Cancer Mother   . 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 Demerol Anaphylaxis and Rash  . Morphine And Related Anaphylaxis and Rash   Lab Results:  Results for orders placed or performed during the hospital encounter of 09/06/15 (from the past 48 hour(s))  Comprehensive metabolic panel     Status: Abnormal   Collection Time: 09/06/15  5:54 PM  Result Value Ref Range   Sodium 138 135 - 145 mmol/L   Potassium 4.4 3.5 - 5.1 mmol/L   Chloride 106 101 - 111 mmol/L   CO2 27 22 - 32 mmol/L   Glucose, Bld 85 65 - 99 mg/dL   BUN 10 6 - 20 mg/dL   Creatinine, Ser 0.53 0.44 - 1.00 mg/dL   Calcium 9.0 8.9 - 10.3 mg/dL   Total Protein 6.4 (L) 6.5 - 8.1 g/dL   Albumin 4.1 3.5 - 5.0 g/dL   AST 20 15 - 41 U/L   ALT 25 14 - 54 U/L   Alkaline Phosphatase 49 38 - 126 U/L   Total Bilirubin 0.5 0.3 - 1.2 mg/dL   GFR calc non Af Amer >60 >60 mL/min   GFR calc Af Amer >60 >60 mL/min    Comment: (NOTE) The eGFR has been calculated using the CKD EPI equation. This calculation has not been validated in all clinical situations. eGFR's persistently <60 mL/min signify possible Chronic Kidney Disease.    Anion gap 5 5 - 15  Ethanol     Status: None   Collection Time: 09/06/15  5:54 PM  Result Value Ref Range   Alcohol, Ethyl (B) <5 <5 mg/dL    Comment:        LOWEST DETECTABLE LIMIT FOR SERUM ALCOHOL IS 5 mg/dL FOR MEDICAL PURPOSES ONLY   Salicylate level     Status: None   Collection Time: 09/06/15  5:54 PM  Result Value Ref Range   Salicylate Lvl <7.3 2.8 - 30.0 mg/dL  Acetaminophen level     Status: Abnormal   Collection Time: 09/06/15  5:54 PM  Result Value Ref Range   Acetaminophen (Tylenol), Serum <10 (L) 10 - 30 ug/mL    Comment:        THERAPEUTIC CONCENTRATIONS VARY SIGNIFICANTLY.  A RANGE OF 10-30 ug/mL MAY BE AN EFFECTIVE CONCENTRATION FOR MANY PATIENTS. HOWEVER, SOME ARE BEST TREATED AT CONCENTRATIONS OUTSIDE THIS RANGE. ACETAMINOPHEN CONCENTRATIONS >150 ug/mL AT 4 HOURS AFTER INGESTION AND >50 ug/mL AT 12 HOURS AFTER INGESTION ARE OFTEN ASSOCIATED WITH TOXIC REACTIONS.   cbc     Status: None   Collection Time: 09/06/15  5:54 PM  Result  Value Ref Range   WBC 9.3 4.0 - 10.5 K/uL   RBC 4.59 3.87 - 5.11 MIL/uL   Hemoglobin 13.8 12.0 - 15.0 g/dL   HCT 41.2 36.0 - 46.0 %   MCV 89.8 78.0 - 100.0 fL   MCH 30.1 26.0 - 34.0 pg   MCHC 33.5 30.0 - 36.0 g/dL   RDW 13.9 11.5 - 15.5 %   Platelets 193 150 - 400 K/uL  Rapid urine drug screen (hospital performed)     Status: None   Collection Time: 09/06/15  5:59 PM  Result Value Ref Range   Opiates NONE DETECTED NONE DETECTED   Cocaine NONE DETECTED NONE DETECTED   Benzodiazepines NONE DETECTED NONE DETECTED   Amphetamines NONE DETECTED NONE DETECTED   Tetrahydrocannabinol NONE DETECTED NONE DETECTED   Barbiturates NONE DETECTED NONE DETECTED    Comment:        DRUG SCREEN FOR MEDICAL PURPOSES ONLY.  IF CONFIRMATION IS NEEDED FOR ANY PURPOSE, NOTIFY LAB WITHIN 5 DAYS.        LOWEST DETECTABLE LIMITS FOR URINE DRUG SCREEN Drug Class       Cutoff (ng/mL) Amphetamine      1000 Barbiturate      200 Benzodiazepine   315 Tricyclics       176 Opiates          300 Cocaine          300 THC              50     Blood Alcohol level:  Lab Results  Component Value Date   ETH <5 09/06/2015   ETH 213* 16/09/3708    Metabolic Disorder Labs:  No results found for: HGBA1C, MPG No results found for: PROLACTIN No results found for: CHOL, TRIG, HDL, CHOLHDL, VLDL, LDLCALC  Current Medications: Current Facility-Administered Medications  Medication Dose Route Frequency Provider Last Rate Last Dose  . acetaminophen (TYLENOL) tablet 650 mg  650 mg Oral Q6H PRN Laverle Hobby, PA-C      . alum & mag  hydroxide-simeth (MAALOX/MYLANTA) 200-200-20 MG/5ML suspension 30 mL  30 mL Oral Q4H PRN Laverle Hobby, PA-C      . lamoTRIgine (LAMICTAL) tablet 50 mg  50 mg Oral Daily Laverle Hobby, PA-C   50 mg at 09/07/15 0818  . magnesium hydroxide (MILK OF MAGNESIA) suspension 30 mL  30 mL Oral Daily PRN Laverle Hobby, PA-C      . phenytoin (DILANTIN) ER capsule 200 mg  200 mg Oral BID Laverle Hobby, PA-C   200 mg at 09/07/15 0818  . QUEtiapine (SEROQUEL) tablet 25 mg  25 mg Oral BID Kerrie Buffalo, NP      . QUEtiapine (SEROQUEL) tablet 50 mg  50 mg Oral QHS Kerrie Buffalo, NP      . traZODone (DESYREL) tablet 150 mg  150 mg Oral QHS PRN Laverle Hobby, PA-C       PTA Medications: Prescriptions prior to admission  Medication Sig Dispense Refill Last Dose  . diazepam (VALIUM) 5 MG tablet Take 1 tablet (5 mg total) by mouth every 6 (six) hours as needed for anxiety (spasms). (Patient not taking: Reported on 09/03/2015) 10 tablet 0 Not Taking at Unknown time  . DULoxetine (CYMBALTA) 60 MG capsule Take 1 capsule (60 mg total) by mouth daily. (Patient not taking: Reported on 09/03/2015) 30 capsule 0 Not Taking at Unknown time  . nicotine (NICODERM CQ - DOSED IN MG/24 HOURS) 21  mg/24hr patch Place 1 patch (21 mg total) onto the skin daily. For smoking cessation (Patient not taking: Reported on 04/26/2015) 28 patch 0 Completed Course at Unknown time  . phenytoin (DILANTIN) 200 MG ER capsule Take 1 capsule (200 mg total) by mouth 2 (two) times daily. (Patient not taking: Reported on 09/03/2015) 60 capsule 0 Not Taking at Unknown time  . traZODone (DESYREL) 150 MG tablet Take 1 tablet (150 mg total) by mouth at bedtime as needed for sleep. (Patient not taking: Reported on 09/03/2015) 30 tablet 0 Not Taking at Unknown time    Musculoskeletal: Strength & Muscle Tone: within normal limits Gait & Station: normal Patient leans: N/A  Psychiatric Specialty Exam: Physical Exam  Nursing note and vitals  reviewed. Psychiatric: Her mood appears not anxious. She is not agitated, not aggressive, not hyperactive and not combative. She does not exhibit a depressed mood.    Review of Systems  Psychiatric/Behavioral: Positive for depression, suicidal ideas and hallucinations. The patient is nervous/anxious.   All other systems reviewed and are negative.   Blood pressure 124/72, pulse 67, temperature 97.8 F (36.6 C), temperature source Oral, resp. rate 18, height _0  (1.626 m), weight 65.772 kg (145 lb).Body mass index is 24.88 kg/(m^2).   General Appearance: Disheveled  Eye Contact: Minimal  Speech: Slow  Volume: Decreased  Mood: Anxious and Depressed  Affect: Constricted and Depressed  Thought Process: Coherent  Orientation: Full (Time, Place, and Person)  Thought Content: Rumination  Suicidal Thoughts: Yes. with intent/plan  Homicidal Thoughts: No  Memory: Immediate; Fair Recent; Fair Remote; Fair  Judgement: Impaired  Insight: Shallow  Psychomotor Activity: Decreased  Concentration: Concentration: Fair and Attention Span: Fair  Recall: [employer] of Knowledge: Fair  Language: Fair  Akathisia: No  Handed: Right  AIMS (if indicated):    Assets: Communication Skills Desire for Improvement  ADL's: Intact  Cognition: WNL  Sleep: Number of Hours: 0.75       Treatment Plan Summary: Admit for crisis management and mood stabilization. Medication management to re-stabilize current mood symptoms Group counseling sessions for coping skills Medical consults as needed Review and reinstate any pertinent home medications for other health problems  Observation Level/Precautions:  15 minute checks  Laboratory:  per ED  Psychotherapy:  group  Medications: Seroquel 25 BID and 50 QHS for mood stabilization, Lamictal 50 mf mood sx, Trazodone 150 mg insomnia  Consultations:  As needed  Discharge Concerns:  safety  Estimated LOS:   2-7 days  Other:     I certify that inpatient services furnished can reasonably be expected to improve the patient's condition.    [facility], NP [facility] 6/22/20174:41 PM

## 2015-09-08 DIAGNOSIS — F332 Major depressive disorder, recurrent severe without psychotic features: Principal | ICD-10-CM

## 2015-09-08 MED ORDER — HYDROXYZINE HCL 25 MG PO TABS
ORAL_TABLET | ORAL | Status: AC
  Administered 2015-09-08: 13:00:00
  Filled 2015-09-08: qty 2

## 2015-09-08 MED ORDER — HYDROXYZINE HCL 50 MG PO TABS
50.0000 mg | ORAL_TABLET | Freq: Four times a day (QID) | ORAL | Status: DC | PRN
  Filled 2015-09-08: qty 1

## 2015-09-08 MED ORDER — HYDROXYZINE HCL 50 MG PO TABS
50.0000 mg | ORAL_TABLET | Freq: Once | ORAL | Status: AC
  Administered 2015-09-08: 50 mg via ORAL
  Filled 2015-09-08 (×2): qty 1

## 2015-09-08 NOTE — [facility] Group Notes (Signed)
[facility]  09/08/2015 8:45 AM  Pt did not attend, declined invitation.   Chad CordialLauren Carter, LCSWA 09/08/2015 12:57 PM

## 2015-09-08 NOTE — Progress Notes (Signed)
Goal today was "just to start feeling like myself again. My medications really need to be adjusted". Rates Anxiety and Depression 10/10. Denies SI/HI/AVH. Encouragement and support given. Medications administered as prescribed. Continue to monitor Q 15 minutes for patient safety and medication effectiveness.

## 2015-09-08 NOTE — [facility] Group Notes (Signed)
[facility] LCSW Group Therapy 09/08/2015 1:15pm  Type of Therapy: Group Therapy- Feelings Around Relapse and Recovery  Participation Level: Minimal  Participation Quality:  N/A  Affect:  Flat  Cognitive: Alert and Oriented   Insight:  Unable to Assess  Engagement in Therapy: Minimal  Modes of Intervention: Clarification, Confrontation, Discussion, Education, Exploration, Limit-setting, Orientation, Problem-solving, Rapport Building, Reality Testing, Socialization and Support  Summary of Progress/Problems: The topic for today was feelings about relapse. The group discussed what relapse prevention is to them and identified triggers that they are on the path to relapse. Members also processed their feeling towards relapse and were able to relate to common experiences. Group also discussed coping skills that can be used for relapse prevention.  Pt was present during group but did not participate in group discussion.   Therapeutic Modalities:   Cognitive Behavioral Therapy Solution-Focused Therapy Assertiveness Training Relapse Prevention Therapy    Annastacia Duba Carter, LCSWA [phone] 09/08/2015 3:27 PM  

## 2015-09-08 NOTE — Progress Notes (Signed)
DAR NOTE: Patient presents with anxious affect and depressed mood.  Denies pain, auditory and visual hallucinations.  Patient reports suicidal thoughts on self inventory form but contracts for safety.  Rates depression at 10+, hopelessness at 10+, and anxiety at 10.  Described energy level as low and concentration as poor.  Patient reports high anxiety level whenever he is around people.   Patient reminded to use the coping skills learned in group session.  Maintained on routine safety checks.  Medications given as prescribed.  Support and encouragement offered as needed.  Attended group and participated.  States goal for today is "head being straight."  Patient remained in her room sleeping off and on.  Patient continues to demand and request for Valium.  Patient received Vistaril 50 mg x 2 for anxiety with fair effect.  Patient states, "I want to resume Valium instead of Vistaril.  Nurse practitioner made aware of request.

## 2015-09-08 NOTE — Progress Notes (Signed)
[facility] MD Progress Note  09/08/2015 11:26 AM Katherine Bond  MRN:  [id number] Subjective:  "still feel sad and hopeless" states she is able to talk a little bit. She is tolerating the Seroquel without any side effects. Pt reports fair sleep and appetite. States that she feels depressed and hopeless, she has no job and feels her prospects are poor. She is concerned because she has lost many of her teeth. She also reports a history of noncompliance with therapy and psychiatric care and realizes that is impeding her progress. Denies suidcidal thoughts this morning.   Principal Problem: MDD (major depressive disorder), recurrent severe, without psychosis ([place]) Diagnosis:   Patient Active Problem List   Diagnosis Date Noted  . Panic disorder [F41.0] 11/05/2014  . Convulsion ([place]) [R56.9]   . MDD (major depressive disorder), recurrent severe, without psychosis ([place]) [F33.2]    Total Time spent with patient: 20 minutes  Past Psychiatric History: History of depression  Past Medical History:  Past Medical History  Diagnosis Date  . Bipolar 1 disorder ([place])   . Depression   . Epilepsy [facility])     Past Surgical History  Procedure Laterality Date  . Pancreaticoduodenectomy    . Cholecystectomy    . Abdominal hysterectomy      partial   . Dental surgery     Family History:  Family History  Problem Relation Age of Onset  . Cancer Mother   . Cancer Father    Family Psychiatric  History:  Social History:  History  Alcohol Use No     History  Drug Use No    Social History   Social History  . Marital Status: Divorced    Spouse Name: N/A  . Number of Children: N/A  . Years of Education: N/A   Social History Main Topics  . Smoking status: Current Some Day Smoker -- 1.00 packs/day for 15 years    Types: Cigarettes  . Smokeless tobacco: Never Used  . Alcohol Use: No  . Drug Use: No  . Sexual Activity: Yes   Other Topics Concern  . None   Social History Narrative   Additional  Social History:    Pain Medications: None Prescriptions: Dilantin (takes for seizures but last took it in March, had one on 06/19 or 06/20) Over the Counter: None History of alcohol / drug use?: No history of alcohol / drug abuse Longest period of sobriety (when/how long): Pt reports being sober for years at a time interrupted by periods of binge drinking Negative Consequences of Use: Personal relationships                    Sleep: Fair  Appetite:  Fair  Current Medications: Current Facility-Administered Medications  Medication Dose Route Frequency Provider Last Rate Last Dose  . acetaminophen (TYLENOL) tablet 650 mg  650 mg Oral Q6H PRN Laverle Hobby, PA-C      . alum & mag hydroxide-simeth (MAALOX/MYLANTA) 200-200-20 MG/5ML suspension 30 mL  30 mL Oral Q4H PRN Laverle Hobby, PA-C      . lamoTRIgine (LAMICTAL) tablet 50 mg  50 mg Oral Daily Laverle Hobby, PA-C   50 mg at 09/08/15 6387  . magnesium hydroxide (MILK OF MAGNESIA) suspension 30 mL  30 mL Oral Daily PRN Laverle Hobby, PA-C      . phenytoin (DILANTIN) ER capsule 200 mg  200 mg Oral BID Laverle Hobby, PA-C   200 mg at 09/08/15 5643  . QUEtiapine (  SEROQUEL) tablet 25 mg  25 mg Oral BID Adonis Brook, NP   25 mg at 09/08/15 0836  . QUEtiapine (SEROQUEL) tablet 50 mg  50 mg Oral QHS Adonis Brook, NP   50 mg at 09/07/15 2200  . traZODone (DESYREL) tablet 150 mg  150 mg Oral QHS PRN Kerry Hough, PA-C        Lab Results:  Results for orders placed or performed during the hospital encounter of 09/06/15 (from the past 48 hour(s))  Comprehensive metabolic panel     Status: Abnormal   Collection Time: 09/06/15  5:54 PM  Result Value Ref Range   Sodium 138 135 - 145 mmol/L   Potassium 4.4 3.5 - 5.1 mmol/L   Chloride 106 101 - 111 mmol/L   CO2 27 22 - 32 mmol/L   Glucose, Bld 85 65 - 99 mg/dL   BUN 10 6 - 20 mg/dL   Creatinine, Ser 6.68 0.44 - 1.00 mg/dL   Calcium 9.0 8.9 - 53.9 mg/dL   Total Protein  6.4 (L) 6.5 - 8.1 g/dL   Albumin 4.1 3.5 - 5.0 g/dL   AST 20 15 - 41 U/L   ALT 25 14 - 54 U/L   Alkaline Phosphatase 49 38 - 126 U/L   Total Bilirubin 0.5 0.3 - 1.2 mg/dL   GFR calc non Af Amer >60 >60 mL/min   GFR calc Af Amer >60 >60 mL/min    Comment: (NOTE) The eGFR has been calculated using the CKD EPI equation. This calculation has not been validated in all clinical situations. eGFR's persistently <60 mL/min signify possible Chronic Kidney Disease.    Anion gap 5 5 - 15  Ethanol     Status: None   Collection Time: 09/06/15  5:54 PM  Result Value Ref Range   Alcohol, Ethyl (B) <5 <5 mg/dL    Comment:        LOWEST DETECTABLE LIMIT FOR SERUM ALCOHOL IS 5 mg/dL FOR MEDICAL PURPOSES ONLY   Salicylate level     Status: None   Collection Time: 09/06/15  5:54 PM  Result Value Ref Range   Salicylate Lvl <4.0 2.8 - 30.0 mg/dL  Acetaminophen level     Status: Abnormal   Collection Time: 09/06/15  5:54 PM  Result Value Ref Range   Acetaminophen (Tylenol), Serum <10 (L) 10 - 30 ug/mL    Comment:        THERAPEUTIC CONCENTRATIONS VARY SIGNIFICANTLY. A RANGE OF 10-30 ug/mL MAY BE AN EFFECTIVE CONCENTRATION FOR MANY PATIENTS. HOWEVER, SOME ARE BEST TREATED AT CONCENTRATIONS OUTSIDE THIS RANGE. ACETAMINOPHEN CONCENTRATIONS >150 ug/mL AT 4 HOURS AFTER INGESTION AND >50 ug/mL AT 12 HOURS AFTER INGESTION ARE OFTEN ASSOCIATED WITH TOXIC REACTIONS.   cbc     Status: None   Collection Time: 09/06/15  5:54 PM  Result Value Ref Range   WBC 9.3 4.0 - 10.5 K/uL   RBC 4.59 3.87 - 5.11 MIL/uL   Hemoglobin 13.8 12.0 - 15.0 g/dL   HCT 89.5 37.3 - 73.5 %   MCV 89.8 78.0 - 100.0 fL   MCH 30.1 26.0 - 34.0 pg   MCHC 33.5 30.0 - 36.0 g/dL   RDW 46.1 49.9 - 31.9 %   Platelets 193 150 - 400 K/uL  Rapid urine drug screen (hospital performed)     Status: None   Collection Time: 09/06/15  5:59 PM  Result Value Ref Range   Opiates NONE DETECTED NONE DETECTED   Cocaine NONE DETECTED  NONE  DETECTED   Benzodiazepines NONE DETECTED NONE DETECTED   Amphetamines NONE DETECTED NONE DETECTED   Tetrahydrocannabinol NONE DETECTED NONE DETECTED   Barbiturates NONE DETECTED NONE DETECTED    Comment:        DRUG SCREEN FOR MEDICAL PURPOSES ONLY.  IF CONFIRMATION IS NEEDED FOR ANY PURPOSE, NOTIFY LAB WITHIN 5 DAYS.        LOWEST DETECTABLE LIMITS FOR URINE DRUG SCREEN Drug Class       Cutoff (ng/mL) Amphetamine      1000 Barbiturate      200 Benzodiazepine   343 Tricyclics       568 Opiates          300 Cocaine          300 THC              50     Blood Alcohol level:  Lab Results  Component Value Date   ETH <5 09/06/2015   ETH 213* 61/68/3729    Metabolic Disorder Labs: No results found for: HGBA1C, MPG No results found for: PROLACTIN No results found for: CHOL, TRIG, HDL, CHOLHDL, VLDL, LDLCALC  Physical Findings: AIMS: Facial and Oral Movements Muscles of Facial Expression: None, normal Lips and Perioral Area: None, normal Jaw: None, normal Tongue: None, normal,Extremity Movements Upper (arms, wrists, hands, fingers): None, normal Lower (legs, knees, ankles, toes): None, normal, Trunk Movements Neck, shoulders, hips: None, normal, Overall Severity Severity of abnormal movements (highest score from questions above): None, normal Incapacitation due to abnormal movements: None, normal Patient's awareness of abnormal movements (rate only patient's report): No Awareness, Dental Status Current problems with teeth and/or dentures?: No Does patient usually wear dentures?: No  CIWA:    COWS:     Musculoskeletal: Strength & Muscle Tone: within normal limits Gait & Station: normal Patient leans: N/A  Psychiatric Specialty Exam: Physical Exam  ROS  Blood pressure 122/84, pulse 74, temperature 98 F (36.7 C), temperature source Oral, resp. rate 16, height '5\' 4"'$  (1.626 m), weight 145 lb (65.772 kg).Body mass index is 24.88 kg/(m^2).  General Appearance:  Disheveled  Eye Contact:  Fair  Speech:  Clear and Coherent  Volume:  Normal  Mood:  Depressed, Dysphoric and Hopeless  Affect:  Constricted and Depressed  Thought Process:  Coherent  Orientation:  Full (Time, Place, and Person)  Thought Content:  WDL  Suicidal Thoughts:  No  Homicidal Thoughts:  No  Memory:  Immediate;   Fair Recent;   Fair Remote;   Fair  Judgement:  Impaired  Insight:  Shallow  Psychomotor Activity:  Normal  Concentration:  Concentration: Fair and Attention Span: Fair  Recall:  [employer] of Knowledge:  Fair  Language:  Fair  Akathisia:  No  Handed:  Right  AIMS (if indicated):     Assets:  Communication Skills Desire for Improvement  ADL's:  Intact  Cognition:  WNL  Sleep:  Number of Hours: 6.5     Treatment Plan Summary: Daily contact with patient to assess and evaluate symptoms and progress in treatment and Medication management  MDD  Continue Seroquel. Encourage patient to attend groups and develop coping skills to deal with her stressors. Asked the patient to come up with 3 things that are important in her life. Pt to develop alternatives to suicidal thoughts. Will continue to monitor for mood and safety.  Elvin So, MD 09/08/2015, 11:26 AM

## 2015-09-09 MED ORDER — OLANZAPINE 5 MG PO TBDP
5.0000 mg | ORAL_TABLET | Freq: Once | ORAL | Status: AC
  Administered 2015-09-09: 5 mg via ORAL
  Filled 2015-09-09 (×2): qty 1

## 2015-09-09 MED ORDER — OLANZAPINE 5 MG PO TBDP
ORAL_TABLET | ORAL | Status: AC
  Filled 2015-09-09: qty 1

## 2015-09-09 MED ORDER — DULOXETINE HCL 30 MG PO CPEP
30.0000 mg | ORAL_CAPSULE | Freq: Every day | ORAL | Status: DC
  Administered 2015-09-09 – 2015-09-10 (×2): 30 mg via ORAL
  Filled 2015-09-09 (×6): qty 1

## 2015-09-09 MED ORDER — OLANZAPINE 5 MG PO TBDP
5.0000 mg | ORAL_TABLET | Freq: Once | ORAL | Status: AC
  Administered 2015-09-09: 5 mg via ORAL
  Filled 2015-09-09: qty 1

## 2015-09-09 NOTE — [facility] Group Notes (Signed)
[facility] LCSW Group Therapy  09/09/2015 10:00 AM  Type of Therapy:  Group Therapy  Participation Level:  Did Not Attend   Katherine Bond 09/09/2015, 12:55 PM  

## 2015-09-09 NOTE — Progress Notes (Signed)
[facility]  09/09/2015 3:08 PM Katherine Bond Bond  MRN:  [id number] Subjective:  "I m not sure the medications are working.   Objective:  Katherine Bond is an 40 y.o. Female Was brought in by [employer]. Having suicidal ideations. She was very anxious and wants to get better and be on the right medications  Principal Problem: MDD (major depressive disorder), recurrent severe, without psychosis ([facility]) Diagnosis:   Patient Active Problem List   Diagnosis Date Noted  . MDD (major depressive disorder), recurrent severe, without psychosis ([facility]) [F33.2]     Priority: High  . Panic disorder [F41.0] 11/05/2014  . Convulsion ([facility]) [R56.9]    Total Time spent with patient: 30 minutes  Past Psychiatric History:  See HPI  Past Medical History:  Past Medical History  Diagnosis Date  . Bipolar 1 disorder ([facility])   . Depression   . Epilepsy [facility])     Past Surgical History  Procedure Laterality Date  . Pancreaticoduodenectomy    . Cholecystectomy    . Abdominal hysterectomy      partial   . Dental surgery     Family History:  Family History  Problem Relation Age of Onset  . Cancer Mother   . Cancer Father    Family Psychiatric  History:  See HPI Social History:  History  Alcohol Use No     History  Drug Use No    Social History   Social History  . Marital Status: Divorced    Spouse Name: N/A  . Number of Children: N/A  . Years of Education: N/A   Social History Main Topics  . Smoking status: Current Some Day Smoker -- 1.00 packs/day for 15 years    Types: Cigarettes  . Smokeless tobacco: Never Used  . Alcohol Use: No  . Drug Use: No  . Sexual Activity: Yes   Other Topics Concern  . None   Social History Narrative   Additional Social History:    Pain Medications: None Prescriptions: Dilantin (takes for seizures but last took it in March, had one on 06/19 or 06/20) Over the Counter: None History of alcohol / drug use?: No history of alcohol / drug abuse Longest  period of sobriety (when/how long): Pt reports being sober for years at a time interrupted by periods of binge drinking Negative Consequences of Use: Personal relationships    Sleep: Fair  Appetite:  Fair  Current Medications: Current Facility-Administered Medications  Medication Dose Route Frequency Provider Last Rate Last Dose  . acetaminophen (TYLENOL) tablet 650 mg  650 mg Oral Q6H PRN Kerry HoughSpencer E Simon, PA-C      . alum & mag hydroxide-simeth (MAALOX/MYLANTA) 200-200-20 MG/5ML suspension 30 mL  30 mL Oral Q4H PRN Kerry HoughSpencer E Simon, PA-C      . DULoxetine (CYMBALTA) DR capsule 30 mg  30 mg Oral Daily Adonis BrookSheila Ileigh Mettler, NP   30 mg at 09/09/15 1503  . hydrOXYzine (ATARAX/VISTARIL) tablet 50 mg  50 mg Oral Q6H PRN Adonis BrookSheila Nykole Matos, NP      . magnesium hydroxide (MILK OF MAGNESIA) suspension 30 mL  30 mL Oral Daily PRN Kerry HoughSpencer E Simon, PA-C      . phenytoin (DILANTIN) ER capsule 200 mg  200 mg Oral BID Kerry HoughSpencer E Simon, PA-C   200 mg at 09/09/15 0806  . QUEtiapine (SEROQUEL) tablet 25 mg  25 mg Oral BID Adonis BrookSheila Sindee Stucker, NP   25 mg at 09/09/15 0806  . QUEtiapine (SEROQUEL) tablet 50 mg  50 mg  Oral QHS Adonis BrookSheila Montine Hight, NP   50 mg at 09/08/15 2240  . traZODone (DESYREL) tablet 150 mg  150 mg Oral QHS PRN Kerry HoughSpencer E Simon, PA-C   150 mg at 09/08/15 2240    Lab Results: No results found for this or any previous visit (from the past 48 hour(s)).  Blood Alcohol level:  Lab Results  Component Value Date   ETH <5 09/06/2015   ETH 213* 12/03/2014    Metabolic Disorder Labs: No results found for: HGBA1C, MPG No results found for: PROLACTIN No results found for: CHOL, TRIG, HDL, CHOLHDL, VLDL, LDLCALC  Physical Findings: AIMS: Facial and Oral Movements Muscles of Facial Expression: None, normal Lips and Perioral Area: None, normal Jaw: None, normal Tongue: None, normal,Extremity Movements Upper (arms, wrists, hands, fingers): None, normal Lower (legs, knees, ankles, toes): None, normal, Trunk  Movements Neck, shoulders, hips: None, normal, Overall Severity Severity of abnormal movements (highest score from questions above): None, normal Incapacitation due to abnormal movements: None, normal Patient's awareness of abnormal movements (rate only patient's report): No Awareness, Dental Status Current problems with teeth and/or dentures?: No Does patient usually wear dentures?: No  CIWA:    COWS:     Musculoskeletal: Strength & Muscle Tone: within normal limits Gait & Station: normal Patient leans: N/A  Psychiatric Specialty Exam: Physical Exam  Nursing note and vitals reviewed. Psychiatric: Her mood appears anxious. Thought content is not paranoid. She exhibits a depressed mood. She expresses no homicidal and no suicidal ideation.    Review of Systems  Psychiatric/Behavioral: Positive for depression. The patient is nervous/anxious and has insomnia.   All other systems reviewed and are negative.   Blood pressure 104/63, pulse 91, temperature 97.8 F (36.6 C), temperature source Oral, resp. rate 15, height 5\' 4"  (1.626 m), weight 65.772 kg (145 lb).Body mass index is 24.88 kg/(m^2).  General Appearance: Neat  Eye Contact:  Good  Speech:  Pressured  Volume:  Normal  Mood:  Depressed  Affect:  Labile  Thought Process:  Coherent  Orientation:  Full (Time, Place, and Person)  Thought Content:  Rumination  Suicidal Thoughts:  No  Homicidal Thoughts:  No  Memory:  Immediate;   Fair Recent;   Fair Remote;   Fair  Judgement:  Impaired  Insight:  Shallow  Psychomotor Activity:  Decreased  Concentration:  Concentration: Fair and Attention Span: Fair  Recall:  [employer]  Fund of Knowledge:  Fair  Language:  Good  Akathisia:  Negative  Handed:  Right  AIMS (if indicated):     Assets:  Resilience  ADL's:  Intact  Cognition:  WNL  Sleep:  Number of Hours: 6.5   Treatment Plan Summary: Admit for crisis management and mood stabilization. Medication management to re-stabilize  current mood symptoms.  Seroquel 25 mg BID and 50 mg QHS mood stabilization.  Lamictal 50 mg mood sx Group counseling sessions for coping skills Medical consults as needed Review and reinstate any pertinent home medications for other health problems  Lindwood QuaSheila May Danetra Glock, NP [facility] 09/09/2015, 3:08 PM

## 2015-09-09 NOTE — Progress Notes (Signed)
Katherine Bond has been growing increasingly anxious about having her medications changed. She also say she needs to have Xanax prescribed "at least for a few days." This Clinical research associatewriter offered her PRN Vistaril, but Katherine Bond declined. Will continue to monitor for needs/safety.

## 2015-09-09 NOTE — Progress Notes (Signed)
[name]  09/09/2015 3:22 PM Katherine Bond  MRN:  [id number] Subjective:  "ISomething is not right.  Im not usually this anxious and depressed.  The meds should have worked a little by now." Objective:  Katherine Bond is an 40 y.o. Female Was brought in by [employer]. Having suicidal ideations. She was very anxious and wants to get better and be on the right medications.  Discussed meds with patients.  She states that Lamictal does not work and is refusing to take.  It was determined that she was on Cymbalta the last time that she was admitted and she desires to be put back on it.  Principal Problem: MDD (major depressive disorder), recurrent severe, without psychosis ([facility]) Diagnosis:   Patient Active Problem List   Diagnosis Date Noted  . MDD (major depressive disorder), recurrent severe, without psychosis ([facility]) [F33.2]     Priority: High  . Panic disorder [F41.0] 11/05/2014  . Convulsion ([facility]) [R56.9]    Total Time spent with patient: 30 minutes  Past Psychiatric History:  See HPI  Past Medical History:  Past Medical History  Diagnosis Date  . Bipolar 1 disorder ([facility])   . Depression   . Epilepsy [facility](HCC)     Past Surgical History  Procedure Laterality Date  . Pancreaticoduodenectomy    . Cholecystectomy    . Abdominal hysterectomy      partial   . Dental surgery     Family History:  Family History  Problem Relation Age of Onset  . Cancer Mother   . Cancer Father    Family Psychiatric  History:  See HPI Social History:  History  Alcohol Use No     History  Drug Use No    Social History   Social History  . Marital Status: Divorced    Spouse Name: N/A  . Number of Children: N/A  . Years of Education: N/A   Social History Main Topics  . Smoking status: Current Some Day Smoker -- 1.00 packs/day for 15 years    Types: Cigarettes  . Smokeless tobacco: Never Used  . Alcohol Use: No  . Drug Use: No  . Sexual Activity: Yes   Other Topics Concern  . None    Social History Narrative   Additional Social History:    Pain Medications: None Prescriptions: Dilantin (takes for seizures but last took it in March, had one on 06/19 or 06/20) Over the Counter: None History of alcohol / drug use?: No history of alcohol / drug abuse Longest period of sobriety (when/how long): Pt reports being sober for years at a time interrupted by periods of binge drinking Negative Consequences of Use: Personal relationships    Sleep: Fair  Appetite:  Fair  Current Medications: Current Facility-Administered Medications  Medication Dose Route Frequency Provider Last Rate Last Dose  . acetaminophen (TYLENOL) tablet 650 mg  650 mg Oral Q6H PRN Kerry HoughSpencer E Simon, PA-C      . alum & mag hydroxide-simeth (MAALOX/MYLANTA) 200-200-20 MG/5ML suspension 30 mL  30 mL Oral Q4H PRN Kerry HoughSpencer E Simon, PA-C      . DULoxetine (CYMBALTA) DR capsule 30 mg  30 mg Oral Daily Adonis BrookSheila Sheikh Leverich, NP   30 mg at 09/09/15 1503  . hydrOXYzine (ATARAX/VISTARIL) tablet 50 mg  50 mg Oral Q6H PRN Adonis BrookSheila Aeriana Speece, NP      . magnesium hydroxide (MILK OF MAGNESIA) suspension 30 mL  30 mL Oral Daily PRN Kerry HoughSpencer E Simon, PA-C      .  phenytoin (DILANTIN) ER capsule 200 mg  200 mg Oral BID Kerry HoughSpencer E Simon, PA-C   200 mg at 09/09/15 [id number]  . QUEtiapine (SEROQUEL) tablet 25 mg  25 mg Oral BID Adonis BrookSheila Myrlene Riera, NP   25 mg at 09/09/15 0806  . QUEtiapine (SEROQUEL) tablet 50 mg  50 mg Oral QHS Adonis BrookSheila Rosamae Rocque, NP   50 mg at 09/08/15 2240  . traZODone (DESYREL) tablet 150 mg  150 mg Oral QHS PRN Kerry HoughSpencer E Simon, PA-C   150 mg at 09/08/15 2240    Lab Results: No results found for this or any previous visit (from the past 48 hour(s)).  Blood Alcohol level:  Lab Results  Component Value Date   ETH <5 09/06/2015   ETH 213* 12/03/2014    Metabolic Disorder Labs: No results found for: HGBA1C, MPG No results found for: PROLACTIN No results found for: CHOL, TRIG, HDL, CHOLHDL, VLDL, LDLCALC  Physical  Findings: AIMS: Facial and Oral Movements Muscles of Facial Expression: None, normal Lips and Perioral Area: None, normal Jaw: None, normal Tongue: None, normal,Extremity Movements Upper (arms, wrists, hands, fingers): None, normal Lower (legs, knees, ankles, toes): None, normal, Trunk Movements Neck, shoulders, hips: None, normal, Overall Severity Severity of abnormal movements (highest score from questions above): None, normal Incapacitation due to abnormal movements: None, normal Patient's awareness of abnormal movements (rate only patient's report): No Awareness, Dental Status Current problems with teeth and/or dentures?: No Does patient usually wear dentures?: No  CIWA:    COWS:     Musculoskeletal: Strength & Muscle Tone: within normal limits Gait & Station: normal Patient leans: N/A  Psychiatric Specialty Exam: Physical Exam  Nursing note and vitals reviewed. Psychiatric: Her mood appears anxious. Thought content is not paranoid. She exhibits a depressed mood. She expresses no homicidal and no suicidal ideation.    Review of Systems  Psychiatric/Behavioral: Positive for depression. The patient is nervous/anxious and has insomnia.   All other systems reviewed and are negative.   Blood pressure 104/63, pulse 91, temperature 97.8 F (36.6 C), temperature source Oral, resp. rate 15, height 5\' 4"  (1.626 m), weight 65.772 kg (145 lb).Body mass index is 24.88 kg/(m^2).  General Appearance: Neat  Eye Contact:  Good  Speech:  Pressured  Volume:  Normal  Mood:  Depressed  Affect:  Labile  Thought Process:  Coherent and Goal Directed  Orientation:  Full (Time, Place, and Person)  Thought Content:  Rumination  Suicidal Thoughts:  No  Homicidal Thoughts:  No  Memory:  Immediate;   Fair Recent;   Fair Remote;   Fair  Judgement:  Impaired  Insight:  Shallow  Psychomotor Activity:  Decreased  Concentration:  Concentration: Fair and Attention Span: Fair  Recall:  [employer]  Fund  of Knowledge:  Fair  Language:  Good  Akathisia:  Negative  Handed:  Right  AIMS (if indicated):     Assets:  Resilience  ADL's:  Intact  Cognition:  WNL  Sleep:  Number of Hours: 6.5   Treatment Plan Summary: Admit for crisis management and mood stabilization. Medication management to re-stabilize current mood symptoms.  Seroquel 25 mg BID and 50 mg QHS mood stabilization.  DC Lamictal 50 mg mood sx, not working and refusing.  Start Cymbalta 30 mg daily depression, patient was on it previous admission.   Group counseling sessions for coping skills Medical consults as needed Review and reinstate any pertinent home medications for other health problems  Lindwood QuaSheila May Kahley Leib, NP [facility] 09/09/2015, 3:22 PM

## 2015-09-09 NOTE — [facility] Group Notes (Signed)
[facility] Group Notes:  (Nursing/MHT/Case Management/Adjunct)  Date:  09/09/2015  Time:  9:47 AM  Type of Therapy:  Nurse Education  Participation Level:  Did Not Attend  Summary of Progress/Problems: Did not attend despite invitation  Maurine SimmeringShugart, Dodge Ator M 09/09/2015, 9:47 AM

## 2015-09-09 NOTE — Progress Notes (Signed)
D: Katherine Bond does not feel her meds are working at all. She says she does not want to be alive but does not have any plan to harm herself. She contracts verbally for safety. She denies HI/AVH/pain. She appears unhappy. She rates her depression, anxiety, and feelings of hopelessness all 10/10. On her self inventory, she reported good sleep, good appetite, low energy level, and poor concentration.  A: Meds given as ordered. Q15 safety checks maintained. Support/encouragement offered. R: Pt remains free from harm and continues with treatment. Will continue to monitor for needs/safety.

## 2015-09-10 MED ORDER — HYDROXYZINE HCL 50 MG PO TABS
50.0000 mg | ORAL_TABLET | Freq: Three times a day (TID) | ORAL | Status: DC
  Administered 2015-09-10 – 2015-09-19 (×27): 50 mg via ORAL
  Filled 2015-09-10 (×16): qty 1
  Filled 2015-09-10: qty 21
  Filled 2015-09-10 (×4): qty 1
  Filled 2015-09-10: qty 21
  Filled 2015-09-10: qty 1
  Filled 2015-09-10: qty 21
  Filled 2015-09-10 (×10): qty 1

## 2015-09-10 MED ORDER — DULOXETINE HCL 20 MG PO CPEP
40.0000 mg | ORAL_CAPSULE | Freq: Every day | ORAL | Status: DC
  Administered 2015-09-11 – 2015-09-13 (×3): 40 mg via ORAL
  Filled 2015-09-10 (×4): qty 2

## 2015-09-10 MED ORDER — LORAZEPAM 0.5 MG PO TABS
0.5000 mg | ORAL_TABLET | Freq: Once | ORAL | Status: AC
  Administered 2015-09-10: 0.5 mg via ORAL
  Filled 2015-09-10: qty 1

## 2015-09-10 NOTE — [facility] Group Notes (Signed)
[facility] Group Notes:  (Nursing/MHT/Case Management/Adjunct)  Date:  09/10/2015  Time:  12:09 PM  Type of Therapy:  Psychoeducational Skills  Participation Level:  Active  Participation Quality:  Appropriate  Affect:  Appropriate  Cognitive:  Appropriate  Insight:  Appropriate  Engagement in Group:  Engaged  Modes of Intervention:  Problem-solving  Summary of Progress/Problems:  Group encouraged to surround themselves with positive and healthy group/support system when changing to a healthy life style.     Bethann PunchesJane O Rosabel Sermeno 09/10/2015, 12:09 PM

## 2015-09-10 NOTE — [facility] Group Notes (Signed)
09/10/2015 10:00 AM  Type of Therapy:  Group Therapy  Participation Level:  Did Not Attend   Katherine Bond 09/10/2015, 1:01 PM 

## 2015-09-10 NOTE — Progress Notes (Signed)
DAR NOTE: Pt present with flat affect and depressed mood in the unit. Pt has been isolating herself and has been in bed most of the time. Pt denies physical pain, took all her meds as scheduled. As per self inventory, pt had a good night sleep, good appetite, low energy, and poor concentration. Pt rate depression at 0, hopeless ness at 0, and anxiety at 8. Pt goal for today is " stop so much anxiety, focus on things." Pt's safety ensured with 15 minute and environmental checks. Pt currently denies SI/HI and A/V hallucinations. Pt verbally agrees to seek staff if SI/HI or A/VH occurs and to consult with staff before acting on these thoughts. Will continue POC.

## 2015-09-10 NOTE — Progress Notes (Signed)
[facility]  09/10/2015 3:22 PM Katherine BrideLisa Tudisco  MRN:  [id number]   Subjective:  "I'm not sure the medications are working.  I'm going to throw up and I'm scared to attend groups."    Objective:  Katherine Bond is an 40 y.o. Female Was brought in by [employer]. Having suicidal ideations. She was very anxious and wants to get better and be on the right medications  Principal Problem: MDD (major depressive disorder), recurrent severe, without psychosis (HCC) Diagnosis:   Patient Active Problem List   Diagnosis Date Noted  . MDD (major depressive disorder), recurrent severe, without psychosis (HCC) [F33.2]     Priority: High  . Panic disorder [F41.0] 11/05/2014  . Convulsion (HCC) [R56.9]    Total Time spent with patient: 30 minutes  Past Psychiatric History:  See HPI  Past Medical History:  Past Medical History  Diagnosis Date  . Bipolar 1 disorder (HCC)   . Depression   . Epilepsy [facility](HCC)     Past Surgical History  Procedure Laterality Date  . Pancreaticoduodenectomy    . Cholecystectomy    . Abdominal hysterectomy      partial   . Dental surgery     Family History:  Family History  Problem Relation Age of Onset  . Cancer Mother   . Cancer Father    Family Psychiatric  History:  See HPI Social History:  History  Alcohol Use No     History  Drug Use No    Social History   Social History  . Marital Status: Divorced    Spouse Name: N/A  . Number of Children: N/A  . Years of Education: N/A   Social History Main Topics  . Smoking status: Current Some Day Smoker -- 1.00 packs/day for 15 years    Types: Cigarettes  . Smokeless tobacco: Never Used  . Alcohol Use: No  . Drug Use: No  . Sexual Activity: Yes   Other Topics Concern  . 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 acetaminophen (TYLENOL) tablet 650 mg  650 mg Oral Q6H PRN Kerry HoughSpencer E Simon, PA-C      . alum & mag hydroxide-simeth (MAALOX/MYLANTA) 200-200-20 MG/5ML suspension 30 mL  30 mL Oral Q4H PRN Kerry HoughSpencer E Simon, PA-C      . [START ON 09/11/2015] DULoxetine (CYMBALTA) DR capsule 40 mg  40 mg Oral Daily Adonis BrookSheila Agustin, NP      . hydrOXYzine (ATARAX/VISTARIL) tablet 50 mg  50 mg Oral TID Adonis BrookSheila Agustin, NP      . LORazepam (ATIVAN) tablet 0.5 mg  0.5 mg Oral Once Adonis BrookSheila Agustin, NP      . magnesium hydroxide (MILK OF MAGNESIA) suspension 30 mL  30 mL Oral Daily PRN Kerry HoughSpencer E Simon, PA-C      . phenytoin (DILANTIN) ER capsule 200 mg  200 mg Oral BID Kerry HoughSpencer E Simon, PA-C   200 mg at 09/10/15 [id number]  .  QUEtiapine (SEROQUEL) tablet 25 mg  25 mg Oral BID Adonis BrookSheila Agustin, NP   25 mg at 09/10/15 [id number]  . QUEtiapine (SEROQUEL) tablet 50 mg  50 mg Oral QHS Adonis BrookSheila Agustin, NP   50 mg at 09/09/15 2357  . traZODone (DESYREL) tablet 150 mg  150 mg Oral QHS PRN Kerry HoughSpencer E Simon, PA-C   150 mg at 09/09/15 2357    Lab Results: No results found for this or any previous visit (from the past 48 hour(s)).  Blood Alcohol level:  Lab Results  Component Value Date   ETH <5 09/06/2015   ETH 213* 12/03/2014    Metabolic Disorder Labs: No results found for: HGBA1C, MPG No results found for: PROLACTIN No results found for: CHOL, TRIG, HDL, CHOLHDL, VLDL, LDLCALC  Physical Findings: AIMS: Facial and Oral Movements Muscles of Facial Expression: None, normal Lips and Perioral Area: None, normal Jaw: None, normal Tongue: None,  normal,Extremity Movements Upper (arms, wrists, hands, fingers): None, normal Lower (legs, knees, ankles, toes): None, normal, Trunk Movements Neck, shoulders, hips: None, normal, Overall Severity Severity of abnormal movements (highest score from questions above): None, normal Incapacitation due to abnormal movements: None, normal Patient's awareness of abnormal movements (rate only patient's report): No Awareness, Dental Status Current problems with teeth and/or dentures?: No Does patient usually wear dentures?: No  CIWA:    COWS:     Musculoskeletal: Strength & Muscle Tone: within normal limits Gait & Station: normal Patient leans: N/A  Psychiatric Specialty Exam: Physical Exam  Nursing note and vitals reviewed. Psychiatric: Her mood appears anxious. Thought content is not paranoid. She exhibits a depressed mood. She expresses no homicidal and no suicidal ideation.    Review of Systems  Psychiatric/Behavioral: Positive for depression. The patient is nervous/anxious and has insomnia.   All other systems reviewed and are negative.   Blood pressure 104/63, pulse 91, temperature 97.8 F (36.6 C), temperature source Oral, resp. rate 15, height 5\' 4"  (1.626 m), weight 65.772 kg (145 lb).Body mass index is 24.88 kg/(m^2).  General Appearance: Neat  Eye Contact:  Good  Speech:  Pressured  Volume:  Normal  Mood:  Depressed  Affect:  Labile  Thought Process:  Coherent  Orientation:  Full (Time, Place, and Person)  Thought Content:  Rumination  Suicidal Thoughts:  No  Homicidal Thoughts:  No  Memory:  Immediate;   Fair Recent;   Fair Remote;   Fair  Judgement:  Impaired  Insight:  Shallow  Psychomotor Activity:  Decreased  Concentration:  Concentration: Fair and Attention Span: Fair  Recall:  [employer]Fair  Fund of Knowledge:  Fair  Language:  Good  Akathisia:  Negative  Handed:  Right  AIMS (if indicated):     Assets:  Resilience  ADL's:  Intact  Cognition:  WNL  Sleep:  Number  of Hours: 6.5   Treatment Plan Summary: Admit for crisis management and mood stabilization. Medication management to re-stabilize current mood symptoms.   Sroquel 25 mg BID and 50 mg QHS mood stabilization.  Lamictal 50 mg mood sx.   Group counseling sessions for coping skills Medical consults as needed Review and reinstate any pertinent home medications for other health problems  Lindwood QuaSheila May Agustin, NP [facility] 09/10/2015, 3:22 PM   Reviewed the information documented and agree with the treatment plan.  [facility]ADDA 09/11/2015 12:33 PM

## 2015-09-11 MED ORDER — GABAPENTIN 300 MG PO CAPS
300.0000 mg | ORAL_CAPSULE | Freq: Three times a day (TID) | ORAL | Status: DC
  Administered 2015-09-11 – 2015-09-19 (×32): 300 mg via ORAL
  Filled 2015-09-11 (×30): qty 1
  Filled 2015-09-11: qty 28
  Filled 2015-09-11 (×3): qty 1
  Filled 2015-09-11: qty 28
  Filled 2015-09-11 (×2): qty 1
  Filled 2015-09-11: qty 28
  Filled 2015-09-11: qty 1
  Filled 2015-09-11: qty 28
  Filled 2015-09-11 (×2): qty 1

## 2015-09-11 NOTE — [facility] Group Notes (Signed)
[facility] LCSW Group Therapy  09/11/2015 1:15pm  Type of Therapy:  Group Therapy vercoming Obstacles  Participation Level:  Minimal  Participation Quality:  Appropriate   Affect:  Appropriate  Cognitive:  Appropriate and Oriented  Insight:  Developing/Improving and Improving  Engagement in Therapy:  Improving  Modes of Intervention:  Discussion, Exploration, Problem-solving and Support  Description of Group:   In this group patients will be encouraged to explore what they see as obstacles to their own wellness and recovery. They will be guided to discuss their thoughts, feelings, and behaviors related to these obstacles. The group will process together ways to cope with barriers, with attention given to specific choices patients can make. Each patient will be challenged to identify changes they are motivated to make in order to overcome their obstacles. This group will be process-oriented, with patients participating in exploration of their own experiences as well as giving and receiving support and challenge from other group members.  Summary of Patient Progress: Although Pt continues to be reserved and participate minimally, her affect is improving and she remains engaged and attentive throughout discussion. Pt has begun to offer affirming comments to other patients as well.    Therapeutic Modalities:   Cognitive Behavioral Therapy Solution Focused Therapy Motivational Interviewing Relapse Prevention Therapy   Chad CordialLauren Carter, LCSWA 09/11/2015 3:02 PM

## 2015-09-11 NOTE — Progress Notes (Signed)
Recreation Therapy Notes  Date: 06.26.2017 Time: 9:30am Location: [address] Group Room   Group Topic: Stress Management  Goal Area(s) Addresses:  Patient will actively participate in stress management techniques presented during session.   Behavioral Response: Did not attend.   Amarea Macdowell L Deamonte Sayegh, LRT/CTRS        Paislie Tessler L 09/11/2015 10:23 AM 

## 2015-09-11 NOTE — Progress Notes (Signed)
Adult Psychoeducational Group Note  Date:  09/11/2015 Time:  9:44 PM  Group Topic/Focus:  Wrap-Up Group:   The focus of this group is to help patients review their daily goal of treatment and discuss progress on daily workbooks.  Participation Level:  Active  Participation Quality:  Attentive  Affect:  Appropriate  Cognitive:  Appropriate  Insight: Good  Engagement in Group:  Engaged  Modes of Intervention:  Activity  Additional Comments:  Patient rated her day an 8. Goal is to get back to self.  Katherine Bond 09/11/2015, 9:44 PM

## 2015-09-11 NOTE — Progress Notes (Signed)
D. Pt had been up and visible in milieu this evening, did attend evening group activity. Pt spoke about on-going anxiety and depression and reports that she has been here a few days now and does not feel any better and reports that she does not feel the medications are helping yet. Pt was able to receive all medications without incident this evening. A. Support provided, medication education given. R. Pt verbalized understanding, safety maintained.

## 2015-09-11 NOTE — Progress Notes (Signed)
Patient ID: Katherine Bond, female   DOB: [birth year], 40 y.o.   MRN: [id number] [facility] MD Progress Note  09/11/2015 4:53 PM Katherine Bond  MRN:  [id number]   Subjective: Katherine Bond reports,  "I'm not sure the medications are working. I'm still feeling very depressed. The Vistaril makes me feel very tired. I don't feel good about myself"    Objective:  Katherine Bond is an 40 y.o. Female Was brought in by [employer]. Having suicidal ideations. She was very anxious and wants to get better and be on the right medications. Today, he continues to endorse worsening symptoms of depression, agitation & suicidal ideations. She is visible on the unit, participating in group sessions. She continue to ruminate about not having any low self esteem. She is complaining of high anxiety/agitations.  Principal Problem: MDD (major depressive disorder), recurrent severe, without psychosis (HCC) Diagnosis:   Patient Active Problem List   Diagnosis Date Noted  . Panic disorder [F41.0] 11/05/2014  . Convulsion (HCC) [R56.9]   . MDD (major depressive disorder), recurrent severe, without psychosis (HCC) [F33.2]    Total Time spent with patient: 15 minutes  Past Psychiatric History:  Major depressive disorder, recurrent   Past Medical History:  Past Medical History  Diagnosis Date  . Bipolar 1 disorder (HCC)   . Depression   . Epilepsy [facility])     Past Surgical History  Procedure Laterality Date  . Pancreaticoduodenectomy    . Cholecystectomy    . Abdominal hysterectomy      partial   . Dental surgery     Family History:  Family History  Problem Relation Age of Onset  . Cancer Mother   . Cancer Father    Family Psychiatric  History:  See HPI  Social History:  History  Alcohol Use No     History  Drug Use No    Social History   Social History  . Marital Status: Divorced    Spouse Name: N/A  . Number of Children: N/A  . Years of Education: N/A   Social History Main Topics  . Smoking status: Current Some Day Smoker  -- 1.00 packs/day for 15 years    Types: Cigarettes  . Smokeless tobacco: Never Used  . Alcohol Use: No  . Drug Use: No  . Sexual Activity: Yes   Other Topics Concern  . 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 acetaminophen (TYLENOL) tablet 650 mg  650 mg Oral Q6H PRN Kerry Hough, PA-C      . alum & mag hydroxide-simeth (MAALOX/MYLANTA) 200-200-20 MG/5ML suspension 30 mL  30 mL Oral Q4H PRN Kerry Hough, PA-C      . DULoxetine (CYMBALTA) DR capsule 40 mg  40 mg Oral Daily Adonis Brook, NP   40 mg at 09/11/15 0809  . gabapentin (NEURONTIN) capsule 300 mg  300 mg Oral TID WC & HS Sanjuana Kava, NP      .  hydrOXYzine (ATARAX/VISTARIL) tablet 50 mg  50 mg Oral TID Adonis BrookSheila Agustin, NP   50 mg at 09/11/15 1638  . magnesium hydroxide (MILK OF MAGNESIA) suspension 30 mL  30 mL Oral Daily PRN Kerry HoughSpencer E Simon, PA-C      . phenytoin (DILANTIN) ER capsule 200 mg  200 mg Oral BID Kerry HoughSpencer E Simon, PA-C   200 mg at 09/11/15 1639  . QUEtiapine (SEROQUEL) tablet 25 mg  25 mg Oral BID Adonis BrookSheila Agustin, NP   25 mg at 09/11/15 1639  . QUEtiapine (SEROQUEL) tablet 50 mg  50 mg Oral QHS Adonis BrookSheila Agustin, NP   50 mg at 09/10/15 2131  . traZODone (DESYREL) tablet 150 mg  150 mg Oral QHS PRN Kerry HoughSpencer E Simon, PA-C   150 mg at 09/10/15 2131    Lab Results: No results found for this or any previous visit (from the past 48  hour(s)).  Blood Alcohol level:  Lab Results  Component Value Date   ETH <5 09/06/2015   ETH 213* 12/03/2014    Metabolic Disorder Labs: No results found for: HGBA1C, MPG No results found for: PROLACTIN No results found for: CHOL, TRIG, HDL, CHOLHDL, VLDL, LDLCALC  Physical Findings: AIMS: Facial and Oral Movements Muscles of Facial Expression: None, normal Lips and Perioral Area: None, normal Jaw: None, normal Tongue: None, normal,Extremity Movements Upper (arms, wrists, hands, fingers): None, normal Lower (legs, knees, ankles, toes): None, normal, Trunk Movements Neck, shoulders, hips: None, normal, Overall Severity Severity of abnormal movements (highest score from questions above): None, normal Incapacitation due to abnormal movements: None, normal Patient's awareness of abnormal movements (rate only patient's report): No Awareness, Dental Status Current problems with teeth and/or dentures?: No Does patient usually wear dentures?: No  CIWA:    COWS:     Musculoskeletal: Strength & Muscle Tone: within normal limits Gait & Station: normal Patient leans: N/A  Psychiatric Specialty Exam: Physical Exam  Nursing note and vitals reviewed. Psychiatric: Her mood appears anxious. Thought content is not paranoid. She exhibits a depressed mood. She expresses no homicidal and no suicidal ideation.    Review of Systems  Psychiatric/Behavioral: Positive for depression. The patient is nervous/anxious and has insomnia.   All other systems reviewed and are negative.   Blood pressure 104/63, pulse 91, temperature 97.8 F (36.6 C), temperature source Oral, resp. rate 15, height 5\' 4"  (1.626 m), weight 65.772 kg (145 lb).Body mass index is 24.88 kg/(m^2).  General Appearance: Neat  Eye Contact:  Good  Speech:  Pressured  Volume:  Normal  Mood:  Depressed  Affect:  Labile  Thought Process:  Coherent  Orientation:  Full (Time, Place, and Person)  Thought Content:  Rumination   Suicidal Thoughts:  No  Homicidal Thoughts:  No  Memory:  Immediate;   Fair Recent;   Fair Remote;   Fair  Judgement:  Impaired  Insight:  Shallow  Psychomotor Activity:  Decreased  Concentration:  Concentration: Fair and Attention Span: Fair  Recall:  [employer]Fair  Fund of Knowledge:  Fair  Language:  Good  Akathisia:  Negative  Handed:  Right  AIMS (if indicated):     Assets:  Resilience  ADL's:  Intact  Cognition:  WNL  Sleep:  Number of Hours: 6.75   Treatment Plan Summary: Continue crisis management &  mood stabilization treatment. Medication management to re-stabilize current mood symptoms; continue the Sroquel 25 mg BID and 50 mg QHS mood control & Lamictal 50 mg mood symptoms.   Depression: Continue the  Cymbalta 40 mg daily. Agitation: Initiaite Gabapentin 300 mg qid. Insomnia: Continue the Trazodone 150 mg Group counseling sessions for coping skills, encourage participation. Medical consults as needed Review and reinstate any pertinent home medications for other health problems  Sanjuana KavaNwoko, Lezley Bedgood I, NP PMHNP, FNP-BC 09/11/2015, 4:53 PM

## 2015-09-11 NOTE — Progress Notes (Signed)
Pt present with a brighter affect than yesterday, pt also has in the milieu interacting with peers more than yesterday. Pt denied any physical pain. Pt stated her goal for today is "depression/anxiety." Pt provided with medications per providers orders. Pt's labs and vitals were monitored throughout the day. Pt supported emotionally and encouraged to express concerns and questions. Pt educated on medications. Pt's safety ensured with 15 minute and environmental checks. Pt currently denies SI/HI and A/V hallucinations. Pt verbally agrees to seek staff if SI/HI or A/VH occurs and to consult with staff before acting on any harmful thoughts. Will continue POC.

## 2015-09-11 NOTE — [facility] Group Notes (Signed)
[facility]  09/11/2015 8:45 AM  Participation Quality: Alert, Appropriate and Oriented  Mood/Affect: Flat  Depression Rating: 8  Anxiety Rating: 10  Thoughts of Suicide: Pt endorses SI  Will you contract for safety? Yes  Current AVH: Pt denies  Plan for Discharge/Comments: Pt attended discharge planning group and actively participated in group. CSW discussed suicide prevention education with the group and encouraged them to discuss discharge planning and any relevant barriers. Pt continues to report high levels of depression and is anxious due to not having a stable discharge plan. Pt [facility].  Transportation Means: Pt reports access to transportation  Supports: No supports mentioned at this time  Chad CordialLauren Carter, LCSWA 09/11/2015 10:57 AM
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
## 2015-09-12 NOTE — [facility] Group Notes (Signed)
[facility] LCSW Group Therapy  09/12/2015   1:15 PM   Type of Therapy:  Group Therapy  Participation Level:  Active  Participation Quality:  Attentive, Sharing and Supportive  Affect:  Blunted  Cognitive:  Alert and Oriented  Insight:  Developing/Improving and Engaged  Engagement in Therapy:  Developing/Improving and Engaged  Modes of Intervention:  Clarification, Confrontation, Discussion, Education, Exploration, Limit-setting, Orientation, Problem-solving, Rapport Building, Dance movement psychotherapisteality Testing, Socialization and Support  Summary of Progress/Problems: The topic for group therapy was feelings about diagnosis.  Pt actively participated in group discussion on their past and current diagnosis and how they feel towards this.  Pt also identified how society and family members judge them, based on their diagnosis as well as stereotypes and stigmas.  Patient reports feeling safe and comfortable while hospitalized but feels judged by others and societal stigmas outside of hospital. She reports dealing with relationship issues which have contributed to her depression. CSW and other group members provided patient with emotional support and encouragement.   Samuella BruinKristin Wauneta Silveria, MSW, LCSW Clinical Social Worker [facility] [phone]

## 2015-09-12 NOTE — Progress Notes (Signed)
D:Patient in the hallway on approach.  Patient states her anxiety is much better today.  Patient states she was scheduled Vistaril and that seems to keep it under control.  Patient denies SI/HI and denies AVH. A: Staff to monitor Q 15 mins for safety.  Encouragement and support offered.  Scheduled medications administered per orders. R: Patient remains safe on the unit.  Patient attended group tonight.  Patient visible on the unit and interacting with peers.  Patient taking administered medications.

## 2015-09-12 NOTE — [facility] Group Notes (Signed)
Adult Psychoeducational Group Note  Date:  09/12/2015 Time:  9:53 PM  Group Topic/Focus:  Wrap-Up Group:   The focus of this group is to help patients review their daily goal of treatment and discuss progress on daily workbooks.  Participation Level:  Minimal  Participation Quality:  Attentive  Affect:  Appropriate  Cognitive:  Appropriate  Insight: Good  Engagement in Group:  Limited  Modes of Intervention:  Discussion  Additional Comments:  Pt stated her day was pretty good and that she was getting back to herself.  Her goal was to stay out of her room and talk to people.  Pt stated she likes to play pool.  Caroll RancherLindsay, Jaclyn Andy A 09/12/2015, 9:53 PM

## 2015-09-12 NOTE — Progress Notes (Signed)
[name]. Meygan had been up and visible in milieu this evening, did attend and participate in evening group activity. Katherine Bond has complained this evening about on-going depression and anxiety and spoke about how she still feels medications are not helping just yet. Katherine Bond was seen interacting more in the milieu this evening and did receive all bedtime medications without incident. A. Support and encouragement provided. R. Safety maintained, will continue to monitor.

## 2015-09-12 NOTE — Progress Notes (Signed)
Recreation Therapy Notes  Animal-Assisted Activity (AAA) Program Checklist/Progress Notes Patient Eligibility Criteria Checklist & Daily Group note for Rec Tx Intervention  Date: 06.27.2017 Time: 2:45pm Location: 400 Hall Dayroom    AAA/T Program Assumption of Risk Form signed by Patient/ or Parent Legal Guardian Yes  Patient is free of allergies or sever asthma Yes  Patient reports no fear of animals Yes  Patient reports no history of cruelty to animals Yes  Patient understands his/her participation is voluntary Yes  Patient washes hands before animal contact Yes  Patient washes hands after animal contact Yes  Behavioral Response: Engaged, Attentive.   Education: Hand Washing, Appropriate Animal Interaction   Education Outcome: Acknowledges education.   Clinical Observations/Feedback: Patient interacted appropriately with therapy dog, petting him appropriately and asking appropriate questions about therapy dog and his training. Additionally patient interacted appropriately with peers during session.  Keijuan Schellhase L Aleczander Fandino, LRT/CTRS        Jaizon Deroos L 09/12/2015 3:05 PM 
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
## 2015-09-13 LAB — PHENYTOIN LEVEL, TOTAL: Phenytoin Lvl: 11.6 ug/mL (ref 10.0–20.0)

## 2015-09-13 MED ORDER — QUETIAPINE FUMARATE 25 MG PO TABS
25.0000 mg | ORAL_TABLET | Freq: Every day | ORAL | Status: DC
  Administered 2015-09-14 – 2015-09-16 (×3): 25 mg via ORAL
  Filled 2015-09-13 (×5): qty 1

## 2015-09-13 MED ORDER — QUETIAPINE FUMARATE 100 MG PO TABS
100.0000 mg | ORAL_TABLET | Freq: Every day | ORAL | Status: DC
  Administered 2015-09-13 – 2015-09-17 (×5): 100 mg via ORAL
  Filled 2015-09-13 (×8): qty 1

## 2015-09-13 MED ORDER — DULOXETINE HCL 60 MG PO CPEP
60.0000 mg | ORAL_CAPSULE | Freq: Every day | ORAL | Status: DC
  Administered 2015-09-14 – 2015-09-19 (×6): 60 mg via ORAL
  Filled 2015-09-13: qty 7
  Filled 2015-09-13 (×7): qty 1

## 2015-09-13 NOTE — [facility] Group Notes (Signed)
[facility] LCSW Group Therapy 09/13/2015 1:15 PM  Type of Therapy: Group Therapy- Emotion Regulation  Participation Level: Active   Participation Quality:  Appropriate  Affect: Appropriate  Cognitive: Alert and Oriented   Insight:  Developing/Improving  Engagement in Therapy: Developing/Improving and Engaged   Modes of Intervention: Clarification, Confrontation, Discussion, Education, Exploration, Limit-setting, Orientation, Problem-solving, Rapport Building, Dance movement psychotherapisteality Testing, Socialization and Support  Summary of Progress/Problems: The topic for group today was emotional regulation. This group focused on both positive and negative emotion identification and allowed group members to process ways to identify feelings, regulate negative emotions, and find healthy ways to manage internal/external emotions. Group members were asked to reflect on a time when their reaction to an emotion led to a negative outcome and explored how alternative responses using emotion regulation would have benefited them. Group members were also asked to discuss a time when emotion regulation was utilized when a negative emotion was experienced. Pt engagement in therapy is improving. She identified anger and emotional pain as difficult to regulate. Pt discussed how her anger is out of control at times. She also discussed the frustration she feels in the relationship with her children as they are minimally receptive to her contact.   Chad CordialLauren Carter, LCSWA 09/13/2015 4:24 PM

## 2015-09-13 NOTE — Tx Team (Signed)
Interdisciplinary Treatment Plan Update (Adult) Date: 09/13/2015   Date: 09/13/2015 8:46 AM  Progress in Treatment:  Attending groups: Yes Participating in groups: Minimally Taking medication as prescribed: Yes  Tolerating medication: Yes  Family/Significant othe contact made: No, Pt declines Patient understands diagnosis: Yes AEB seeking help for depression  Discussing patient identified problems/goals with staff: Yes  Medical problems stabilized or resolved: Yes  Denies suicidal/homicidal ideation: Yes Patient has not harmed self or Others: Yes   New problem(s) identified: None identified at this time.   Discharge Plan or Barriers: CSW will assess for appropriate discharge plan and relevant barriers.   09/12/15: Pt considering [facility] at discharge. Follow-up to be determined when discharge plan is determined  Additional comments:  Patient and CSW reviewed pt's identified goals and treatment plan. Patient verbalized understanding and agreed to treatment plan.   Reason for Continuation of Hospitalization:  Anxiety Depression Medication stabilization Suicidal ideation  Estimated length of stay: 2-3 days  Review of initial/current patient goals per problem list:   1.  Goal(s): Patient will participate in aftercare plan  Met:  Progressing  Target date: 3-5 days from date of admission   As evidenced by: Patient will participate within aftercare plan AEB aftercare provider and housing plan at discharge being identified.  09/07/15: CSW to work with Pt to assess for appropriate discharge plan and faciliate appointments and referrals as needed prior to d/c. 09/12/15:  Pt considering [facility] at discharge. Follow-up to be determined when discharge plan is determined  2.  Goal (s): Patient will exhibit decreased depressive symptoms and suicidal ideations.  Met:  Progressing  Target date: 3-5 days from date of admission   As evidenced by: Patient will utilize self  rating of depression at 3 or below and demonstrate decreased signs of depression or be deemed stable for discharge by MD. 09/07/15: Pt was admitted with symptoms of depression, rating 10/10. Pt continues to present with flat affect and depressive symptoms.  Pt will demonstrate decreased symptoms of depression and rate depression at 3/10 or lower prior to discharge. 09/12/15: Pt rates depression at 6/10; denies SI. Affect and engagement in therapy is improving.  3.  Goal(s): Patient will demonstrate decreased signs and symptoms of anxiety.  Met:  No  Target date: 3-5 days from date of admission   As evidenced by: Patient will utilize self rating of anxiety at 3 or below and demonstrated decreased signs of anxiety, or be deemed stable for discharge by MD 09/07/15: Pt was admitted with increased levels of anxiety and is currently rating those symptoms highly. Pt will demonstrated decreased symptoms of anxiety and rate it at 3/10 prior to d/c. 09/12/15: Pt rates anxiety at 8/10  Attendees:  Patient:    Family:    Physician: Dr. Einar Grad, MD  09/13/2015 8:46 AM  Nursing: Rayburn Go, RN 09/13/2015 8:46 AM  Clinical Social Worker Peri Maris, [place] 09/13/2015 8:46 AM  Other: Tilden Fossa, LCSWA 09/13/2015 8:46 AM  Clinical: Lars Pinks, RN Case manager  09/13/2015 8:46 AM  Other:  09/13/2015 8:46 AM  Other:     Peri Maris, [place] [facility] [phone]

## 2015-09-13 NOTE — Progress Notes (Signed)
Patient ID: Katherine Katherine Bond, female   DOB: May 22, 1975, 40 y.o.   MRN: [id number] [facility]  09/13/2015 12:39 PM Katherine Bond [place]  MRN:  [id number]   Subjective:  "I'm sad today.  Yesterday I was happy but today I'm sad." Objective: Patient is seen, chart reviewed and case discussed with the treatment team.  Patient denies current suicidal/homicidal ideation, auditory/visual hallucinations and delusions or paranoia.  She was very anxious and wants to get better and be on the right medications. She states "up and down" with her sadness.  Discussed with patient that medications and patience are needed to get well.  Also recognized that she is seen in groups and is trying to learn from group therapy.  She continues to be non disruptive to the milieu.     Principal Problem: MDD (major depressive disorder), recurrent severe, without psychosis (HCC) Diagnosis:   Patient Active Problem List   Diagnosis Date Noted  . MDD (major depressive disorder), recurrent severe, without psychosis (HCC) [F33.2]     Priority: High  . Panic disorder [F41.0] 11/05/2014  . Convulsion (HCC) [R56.9]    Total Time spent with patient: 15 minutes  Past Psychiatric History:  Major depressive disorder, recurrent   Past Medical History:  Past Medical History  Diagnosis Date  . Bipolar 1 disorder (HCC)   . Depression   . Epilepsy [facility](HCC)     Past Surgical History  Procedure Laterality Date  . Pancreaticoduodenectomy    . Cholecystectomy    . Abdominal hysterectomy      partial   . Dental surgery     Family History:  Family History  Problem Relation Age of Onset  . Cancer Mother   . Cancer Father    Family Psychiatric  History:  See HPI  Social History:  History  Alcohol Use No     History  Drug Use No    Social History   Social History  . Marital Status: Divorced    Spouse Name: N/A  . Number of Children: N/A  . Years of Education: N/A   Social History Main Topics  . Smoking status: Current  Some Day Smoker -- 1.00 packs/day for 15 years    Types: Cigarettes  . Smokeless tobacco: Never Used  . Alcohol Use: No  . Drug Use: No  . Sexual Activity: Yes   Other Topics Concern  . 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 acetaminophen (TYLENOL) tablet 650 mg  650 mg Oral Q6H PRN Kerry HoughSpencer E Simon, PA-C      . alum & mag hydroxide-simeth (MAALOX/MYLANTA) 200-200-20 MG/5ML suspension 30 mL  30 mL Oral Q4H PRN Kerry HoughSpencer E Simon, PA-C      . [START ON 09/14/2015] DULoxetine (CYMBALTA) DR capsule 60 mg  60 mg Oral Daily Adonis BrookSheila Daesha Insco, NP      . gabapentin (NEURONTIN) capsule 300 mg  300 mg Oral TID WC & HS Nelda MarseilleAgnes I  Nwoko, NP   300 mg at 09/13/15 1201  . hydrOXYzine (ATARAX/VISTARIL) tablet 50 mg  50 mg Oral TID Adonis BrookSheila Nikolis Berent, NP   50 mg at 09/13/15 1201  . magnesium hydroxide (MILK OF MAGNESIA) suspension 30 mL  30 mL Oral Daily PRN Kerry HoughSpencer E Simon, PA-C      . phenytoin (DILANTIN) ER capsule 200 mg  200 mg Oral BID Kerry HoughSpencer E Simon, PA-C   200 mg at 09/13/15 0818  . QUEtiapine (SEROQUEL) tablet 100 mg  100 mg Oral QHS Adonis BrookSheila Kymorah Korf, NP      . Melene Muller[START ON 09/14/2015] QUEtiapine (SEROQUEL) tablet 25 mg  25 mg Oral Daily Adonis BrookSheila Aleen Marston, NP      . traZODone (DESYREL) tablet 150 mg  150 mg Oral QHS PRN Kerry HoughSpencer E Simon, PA-C   150 mg at 09/12/15 2142    Lab Results:  Results for orders placed or performed during the  hospital encounter of 09/07/15 (from the past 48 hour(s))  Phenytoin level, total     Status: None   Collection Time: 09/13/15  6:21 AM  Result Value Ref Range   Phenytoin Lvl 11.6 10.0 - 20.0 ug/mL    Comment: [facility]    Blood Alcohol level:  Lab Results  Component Value Date   [facility] 09/06/2015   ETH 213* 12/03/2014    Metabolic Disorder Labs: No results found for: HGBA1C, MPG No results found for: PROLACTIN No results found for: CHOL, TRIG, HDL, CHOLHDL, VLDL, LDLCALC  Physical Findings: AIMS: Facial and Oral Movements Muscles of Facial Expression: None, normal Lips and Perioral Area: None, normal Jaw: None, normal Tongue: None, normal,Extremity Movements Upper (arms, wrists, hands, fingers): None, normal Lower (legs, knees, ankles, toes): None, normal, Trunk Movements Neck, shoulders, hips: None, normal, Overall Severity Severity of abnormal movements (highest score from questions above): None, normal Incapacitation due to abnormal movements: None, normal Patient's awareness of abnormal movements (rate only patient's report): No Awareness, Dental Status Current problems with teeth and/or dentures?: No Does patient usually wear dentures?: No  CIWA:    COWS:     Musculoskeletal: Strength & Muscle Tone: within normal limits Gait & Station: normal Patient leans: N/A  Psychiatric Specialty Exam: Physical Exam  Nursing note and vitals reviewed. Psychiatric: Her mood appears anxious. Thought content is not paranoid. She exhibits a depressed mood. She expresses no homicidal and no suicidal ideation.    Review of Systems  Psychiatric/Behavioral: Positive for depression. The patient is nervous/anxious and has insomnia.   All other systems reviewed and are negative.   Blood pressure 99/74, pulse 86, temperature 97.9 F (36.6 C), temperature source Oral, resp. rate 16, height 5\' 4"  (1.626 m), weight 65.772 kg (145 lb).Body mass index is  24.88 kg/(m^2).  General Appearance: Neat  Eye Contact:  Good  Speech:  Pressured  Volume:  Normal  Mood:  Depressed  Affect:  Labile  Thought Process:  Coherent  Orientation:  Full (Time, Place, and Person)  Thought Content:  Rumination  Suicidal Thoughts:  No  Homicidal Thoughts:  No  Memory:  Immediate;   Fair Recent;   Fair Remote;   Fair  Judgement:  Impaired  Insight:  Shallow  Psychomotor Activity:  Decreased  Concentration:  Concentration: Fair and Attention Span: Fair  Recall:  [employer]Fair  Fund of Knowledge:  Fair  Language:  Good  Akathisia:  Negative  Handed:  Right  AIMS (if indicated):     Assets:  Resilience  ADL's:  Intact  Cognition:  WNL  Sleep:  Number of Hours: 6.75   Treatment Plan Summary:  Continue crisis management &  mood stabilization treatment. Medication management to re-stabilize current mood symptoms;   Sroquel 25 mg to daily and increase night time dose to 100 mg QHS mood control.   Depression: Increase the Cymbalta to 60 mg daily. Agitation: Continue Gabapentin 300 mg qid. Insomnia: Continue Trazodone 150 mg Seizures: Phenytoin 200 mg twice daily will check serum from time level tomorrow morning for therapeutic window and also to avoid toxicity.  Group counseling sessions for coping skills, encourage participation. Medical consults as needed Review and reinstate any pertinent home medications for other health problems Disposition plans are in process and patient hoping to be admitted at [facility] in [facility] and working with case Production designer, theatre/television/film. Length of stay 3-5 days   Velna Hatchet May Miata Culbreth, NP-BC  09/13/2015, 12:39 PM

## 2015-09-13 NOTE — Progress Notes (Signed)
Patient ID: Katherine Bond, female   DOB: Feb 17, 1976, 40 y.o.   MRN: [id number]   Pt currently presents with a blunted affect and hyperactive, pleasant behavior. Per self inventory, pt rates depression at a 6, hopelessness 8 and anxiety 8. Pt's daily goal is to "not feel sad or anxious" and they intend to do so by "stay out of room, socialize." Pt reports good sleep, a good appetite, normal energy and poor concentration.   Pt provided with medications per providers orders. Pt's labs and vitals were monitored throughout the day. Pt supported emotionally and encouraged to express concerns and questions. Pt educated on medications and suicide prevention resources.   Pt's safety ensured with 15 minute and environmental checks. Pt currently denies SI/HI and A/V hallucinations. Pt verbally agrees to seek staff if SI/HI or A/VH occurs and to consult with staff before acting on any harmful thoughts. In regards to discharge pt writes "I need to figure out exactly what to do either suck it up and go back and live with toxic relationship or suck it up and go to a shelter." Will continue POC.

## 2015-09-13 NOTE — [facility] Group Notes (Signed)
[facility]  09/13/2015 8:45 AM  Participation Quality: Alert, Appropriate and Oriented  Mood/Affect: Improving  Depression Rating: 8  Anxiety Rating: 8  Thoughts of Suicide: Pt reports that she plans for ways that she could commit suicide but has no intent  Will you contract for safety? Yes  Current AVH: Pt denies  Plan for Discharge/Comments: Pt attended discharge planning group and actively participated in group. CSW discussed suicide prevention education with the group and encouraged them to discuss discharge planning and any relevant barriers. Pt reports feeling "bad" today but affect has improved. She is considering two choices for discharge planning.  Transportation Means: Pt reports access to transportation  Supports: No supports mentioned at this time  Chad CordialLauren Carter, LCSWA 09/13/2015 9:53 AM

## 2015-09-13 NOTE — Progress Notes (Signed)
Recreation Therapy Notes  Date: 06.28.2017 Time: 9:30am Location: 9:30am  Group Topic: Stress Management  Goal Area(s) Addresses:  Patient will actively participate in stress management techniques presented during session.   Behavioral Response: Engaged, Attentive, Appropriate   Intervention: Stress management techniques  Activity :  Deep Breathing, Progressive Body Relaxation and Progressive Body Scan. LRT provided education, instruction and demonstration on practice of Deep Breathing, Progressive Body Relaxation and Progressive Body Scan. Patient was asked to participate in technique introduced during session.   Education:  Stress Management, Discharge Planning.   Education Outcome: Acknowledges education  Clinical Observations/Feedback: Patient actively engaged in technique introduced, expressed no concerns and demonstrated ability to practice independently post d/c.   Tiara Bartoli L Saharra Santo, LRT/CTRS        Kynslee Baham L 09/13/2015 10:27 AM 

## 2015-09-14 NOTE — Progress Notes (Signed)
[facility] MD Progress Note  09/14/2015 4:17 PM Katherine Bond  MRN:  [id number]   Subjective:  "I'm fair.  One of the patients upset me and embarrassed me in front of the group." Objective: Patient is seen, chart reviewed and case discussed with the treatment team.  Patient denies current suicidal/homicidal ideation, auditory/visual hallucinations and delusions or paranoia.  She was very anxious and wants to get better and be on the right medications. She states "up and down" with her sadness.  Discussed with patient that medications and patience are needed to get well.  Also recognized that she is seen in groups and is trying to learn from group therapy.  She continues to be non disruptive to the milieu.     Principal Problem: MDD (major depressive disorder), recurrent severe, without psychosis (HCC) Diagnosis:   Patient Active Problem List   Diagnosis Date Noted  . MDD (major depressive disorder), recurrent severe, without psychosis (HCC) [F33.2]     Priority: High  . Panic disorder [F41.0] 11/05/2014  . Convulsion (HCC) [R56.9]    Total Time spent with patient: 15 minutes  Past Psychiatric History:  Major depressive disorder, recurrent   Past Medical History:  Past Medical History  Diagnosis Date  . Bipolar 1 disorder (HCC)   . Depression   . Epilepsy [facility])     Past Surgical History  Procedure Laterality Date  . Pancreaticoduodenectomy    . Cholecystectomy    . Abdominal hysterectomy      partial   . Dental surgery     Family History:  Family History  Problem Relation Age of Onset  . Cancer Mother   . Cancer Father    Family Psychiatric  History:  See HPI  Social History:  History  Alcohol Use No     History  Drug Use No    Social History   Social History  . Marital Status: Divorced    Spouse Name: N/A  . Number of Children: N/A  . Years of Education: N/A   Social History Main Topics  . Smoking status: Current Some Day Smoker -- 1.00 packs/day for 15 years   Types: Cigarettes  . Smokeless tobacco: Never Used  . Alcohol Use: No  . Drug Use: No  . Sexual Activity: Yes   Other Topics Concern  . 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 acetaminophen (TYLENOL) tablet 650 mg  650 mg Oral Q6H PRN Kerry Hough, PA-C      . alum & mag hydroxide-simeth (MAALOX/MYLANTA) 200-200-20 MG/5ML suspension 30 mL  30 mL Oral Q4H PRN Kerry Hough, PA-C      . DULoxetine (CYMBALTA) DR capsule 60 mg  60 mg Oral Daily Adonis Brook, NP   60 mg at 09/14/15 0800  . gabapentin (NEURONTIN) capsule 300 mg  300 mg Oral TID WC & HS Sanjuana Kava, NP   300 mg at 09/14/15 1150  . hydrOXYzine (  ATARAX/VISTARIL) tablet 50 mg  50 mg Oral TID Adonis BrookSheila Aloni Chuang, NP   50 mg at 09/14/15 1150  . magnesium hydroxide (MILK OF MAGNESIA) suspension 30 mL  30 mL Oral Daily PRN Kerry HoughSpencer E Simon, PA-C      . phenytoin (DILANTIN) ER capsule 200 mg  200 mg Oral BID Kerry HoughSpencer E Simon, PA-C   200 mg at 09/14/15 0800  . QUEtiapine (SEROQUEL) tablet 100 mg  100 mg Oral QHS Adonis BrookSheila Genova Kiner, NP   100 mg at 09/13/15 2124  . QUEtiapine (SEROQUEL) tablet 25 mg  25 mg Oral Daily Adonis BrookSheila Miquela Costabile, NP   25 mg at 09/14/15 0800  . traZODone (DESYREL) tablet 150 mg  150 mg Oral QHS PRN Kerry HoughSpencer E Simon, PA-C   150 mg at 09/13/15 2123    Lab Results:  Results for orders placed or performed during the hospital encounter of 09/07/15  (from the past 48 hour(s))  Phenytoin level, total     Status: None   Collection Time: 09/13/15  6:21 AM  Result Value Ref Range   Phenytoin Lvl 11.6 10.0 - 20.0 ug/mL    Comment: [facility]    Blood Alcohol level:  Lab Results  Component Value Date   [facility] 09/06/2015   ETH 213* 12/03/2014    Metabolic Disorder Labs: No results found for: HGBA1C, MPG No results found for: PROLACTIN No results found for: CHOL, TRIG, HDL, CHOLHDL, VLDL, LDLCALC  Physical Findings: AIMS: Facial and Oral Movements Muscles of Facial Expression: None, normal Lips and Perioral Area: None, normal Jaw: None, normal Tongue: None, normal,Extremity Movements Upper (arms, wrists, hands, fingers): None, normal Lower (legs, knees, ankles, toes): None, normal, Trunk Movements Neck, shoulders, hips: None, normal, Overall Severity Severity of abnormal movements (highest score from questions above): None, normal Incapacitation due to abnormal movements: None, normal Patient's awareness of abnormal movements (rate only patient's report): No Awareness, Dental Status Current problems with teeth and/or dentures?: No Does patient usually wear dentures?: No  CIWA:    COWS:     Musculoskeletal: Strength & Muscle Tone: within normal limits Gait & Station: normal Patient leans: N/A  Psychiatric Specialty Exam: Physical Exam  Nursing note and vitals reviewed. Psychiatric: Her mood appears anxious. Thought content is not paranoid. She exhibits a depressed mood. She expresses no homicidal and no suicidal ideation.    Review of Systems  Psychiatric/Behavioral: Positive for depression. The patient is nervous/anxious and has insomnia.   All other systems reviewed and are negative.   Blood pressure 94/66, pulse 72, temperature 98 F (36.7 C), temperature source Oral, resp. rate 16, height 5\' 4"  (1.626 m), weight 65.772 kg (145 lb).Body mass index is 24.88 kg/(m^2).  General  Appearance: Neat  Eye Contact:  Good  Speech:  Pressured  Volume:  Normal  Mood:  Depressed  Affect:  Labile  Thought Process:  Coherent  Orientation:  Full (Time, Place, and Person)  Thought Content:  Rumination  Suicidal Thoughts:  No  Homicidal Thoughts:  No  Memory:  Immediate;   Fair Recent;   Fair Remote;   Fair  Judgement:  Impaired  Insight:  Shallow  Psychomotor Activity:  Decreased  Concentration:  Concentration: Fair and Attention Span: Fair  Recall:  [employer]Fair  Fund of Knowledge:  Fair  Language:  Good  Akathisia:  Negative  Handed:  Right  AIMS (if indicated):     Assets:  Resilience  ADL's:  Intact  Cognition:  WNL  Sleep:  Number of Hours: 6.5   Treatment Plan Summary:  Continue crisis management &  mood stabilization treatment. Medication management to re-stabilize current mood symptoms;   Sroquel 25 mg to daily and increase night time dose to 100 mg QHS mood control.   Depression: Increase the Cymbalta to 60 mg daily. Agitation: Continue Gabapentin 300 mg qid. Insomnia: Continue Trazodone 150 mg Seizures: Phenytoin 200 mg twice daily will check serum from time level tomorrow morning for therapeutic window and also to avoid toxicity.  Group counseling sessions for coping skills, encourage participation. Medical consults as needed Review and reinstate any pertinent home medications for other health problems Disposition plans are in process and [facility] and working with case Production designer, theatre/television/filmmanager. Length of stay 3-5 days   Velna HatchetSheila May Corian Handley, NP-BC  09/14/2015, 4:17 PM

## 2015-09-14 NOTE — Progress Notes (Signed)
D:Patient in her room on approach.  Patient states she had a good day but states another patient was picking on her and it hurt her feelings.  Patient states she does not handle that well.  Patient states she wants her depression and anxiety to be better.  Patient denies SI/HI and denies AVH. A: Staff to monitor Q 15 mins for safety.  Encouragement and support offered.  Scheduled medications administered per orders.  Trazodone administered prn for sleep. R: Patient remains safe on the unit.  Patient attended group tonight.  Patient visible on the unit and interacting with peers.  Patient taking administered medications.

## 2015-09-14 NOTE — Progress Notes (Signed)
Pt reports that her day has been fine up until group time when she was speaking in group and made a statement about someone speaking "ghetto", and another patient became offended by her use of the word.  She says that she apologized and did what she could to appease the other patient, but the other patient is not letting the incident go, and she says she is feeling very uncomfortable and embarrassed.  Writer spoke to the nurse of the other patient to see if this can be resolved.  This patient was thanked for apologizing and doing her part to remedy the situation.  Pt was assured that we did not want her to feel uncomfortable, and to feel free to express herself appropriately.  Pt denies SI/HI/AVH.  She has been pleasant and cooperative.  Support and encouragement offered.  Pt medicated per MD orders.  Discharge plans are in process.  Safety maintained with q15 minute checks.

## 2015-09-14 NOTE — [facility] Group Notes (Signed)
[facility] LCSW Group Therapy 09/14/2015 1:15 PM Type of Therapy: Group Therapy Participation Level: Active  Participation Quality: Attentive, Sharing and Supportive  Affect: Appropriate  Cognitive: Alert and Oriented  Insight: Developing/Improving and Engaged  Engagement in Therapy: Developing/Improving and Engaged  Modes of Intervention: Activity, Clarification, Confrontation, Discussion, Education, Exploration, Limit-setting, Orientation, Problem-solving, Rapport Building, Reality Testing, Socialization and Support  Summary of Progress/Problems: Patient was attentive and engaged with speaker from [employer]. Patient was attentive to speaker while they shared their story of dealing with mental health and overcoming it. Patient expressed interest in their programs and services and received information on their agency. Patient processed ways they can relate to the speaker.   Ariele Vidrio, LCSW Clinical Social Worker [facility] [phone]   

## 2015-09-14 NOTE — [facility] Group Notes (Signed)

## 2015-09-14 NOTE — Progress Notes (Signed)
DAR NOTE: Patient presents with bright mood and affect.  Denies pain, auditory and visual hallucinations.  Rates depression at 8, hopelessness at 0, and anxiety at 2.  Maintained on routine safety checks.  Medications given as prescribed.  Support and encouragement offered as needed.  Attended group and participated.  States goal for today is "get happier."  Patient observed socializing with peers in the dayroom.  Offered no complaint.

## 2015-09-15 NOTE — Progress Notes (Signed)
Nutrition Education Note  Pt attended group focusing on general, healthful nutrition education.  RD emphasized the importance of eating regular meals and snacks throughout the day. Consuming sugar-free beverages and incorporating fruits and vegetables into diet when possible. Provided examples of healthy snacks. Patient encouraged to leave group with a goal to improve nutrition/healthy eating.   Diet Order: Diet regular Room service appropriate?: Yes; Fluid consistency:: Thin Pt is also offered choice of unit snacks mid-morning and mid-afternoon.  Pt is eating as desired.   If additional nutrition issues arise, please consult RD.    Tegh Franek, MS, RD, LDN Inpatient Clinical Dietitian Pager # [phone] After hours/weekend pager # [phone]     

## 2015-09-15 NOTE — [facility] Group Notes (Signed)
[facility] LCSW Group Therapy 09/15/2015 1:15pm  Type of Therapy: Group Therapy- Feelings Around Relapse and Recovery  Participation Level: Active   Participation Quality:  Appropriate  Affect:  Appropriate  Cognitive: Alert and Oriented   Insight:  Developing   Engagement in Therapy: Developing/Improving and Engaged   Modes of Intervention: Clarification, Confrontation, Discussion, Education, Exploration, Limit-setting, Orientation, Problem-solving, Rapport Building, Dance movement psychotherapisteality Testing, Socialization and Support  Summary of Progress/Problems: The topic for today was feelings about relapse. The group discussed what relapse prevention is to them and identified triggers that they are on the path to relapse. Members also processed their feeling towards relapse and were able to relate to common experiences. Group also discussed coping skills that can be used for relapse prevention.  Pt was active in discussion today and talked about her difficulty managing self-blame, despite insight related to the irrational blame-placing. Pt was receptive to feedback and reports being motivated for change.    Therapeutic Modalities:   Cognitive Behavioral Therapy Solution-Focused Therapy Assertiveness Training Relapse Prevention Therapy    Lamar SprinklesLauren Carter, LCSWA [phone] 09/15/2015 3:19 PM

## 2015-09-15 NOTE — Progress Notes (Signed)
[facility] MD Progress Note  09/15/2015 3:38 PM Katherine Bond  MRN:  [id number]   Subjective:  "I'mbetter.  Fred apologized to me." Objective: Patient is seen, chart reviewed and case discussed with the treatment team.  Patient denies current suicidal/homicidal ideation, auditory/visual hallucinations and delusions or paranoia.  She appears more calm.  Observed by this NP to be playing cards with fellow patients.  and wants to get better and be on the right medications. She states "up and down" with her sadness.  Discussed with patient that medications and patience are needed to get well.  Also recognized that she is seen in groups and is trying to learn from group therapy.  She continues to be non disruptive to the milieu.     Principal Problem: MDD (major depressive disorder), recurrent severe, without psychosis (HCC) Diagnosis:   Patient Active Problem List   Diagnosis Date Noted  . MDD (major depressive disorder), recurrent severe, without psychosis (HCC) [F33.2]     Priority: High  . Panic disorder [F41.0] 11/05/2014  . Convulsion (HCC) [R56.9]    Total Time spent with patient: 15 minutes  Past Psychiatric History:  Major depressive disorder, recurrent   Past Medical History:  Past Medical History  Diagnosis Date  . Bipolar 1 disorder (HCC)   . Depression   . Epilepsy [facility])     Past Surgical History  Procedure Laterality Date  . Pancreaticoduodenectomy    . Cholecystectomy    . Abdominal hysterectomy      partial   . Dental surgery     Family History:  Family History  Problem Relation Age of Onset  . Cancer Mother   . Cancer Father    Family Psychiatric  History:  See HPI  Social History:  History  Alcohol Use No     History  Drug Use No    Social History   Social History  . Marital Status: Divorced    Spouse Name: N/A  . Number of Children: N/A  . Years of Education: N/A   Social History Main Topics  . Smoking status: Current Some Day Smoker -- 1.00 packs/day  for 15 years    Types: Cigarettes  . Smokeless tobacco: Never Used  . Alcohol Use: No  . Drug Use: No  . Sexual Activity: Yes   Other Topics Concern  . 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 acetaminophen (TYLENOL) tablet 650 mg  650 mg Oral Q6H PRN Kerry Hough, PA-C      . alum & mag hydroxide-simeth (MAALOX/MYLANTA) 200-200-20 MG/5ML suspension 30 mL  30 mL Oral Q4H PRN Kerry Hough, PA-C      . DULoxetine (CYMBALTA) DR capsule 60 mg  60 mg Oral Daily Adonis Brook, NP   60 mg at 09/15/15 0818  . gabapentin (NEURONTIN) capsule 300 mg  300 mg Oral TID WC & HS Sanjuana Kava, NP   300 mg at 09/15/15  1152  . hydrOXYzine (ATARAX/VISTARIL) tablet 50 mg  50 mg Oral TID Adonis BrookSheila Toshiko Kemler, NP   50 mg at 09/15/15 1152  . magnesium hydroxide (MILK OF MAGNESIA) suspension 30 mL  30 mL Oral Daily PRN Kerry HoughSpencer E Simon, PA-C      . phenytoin (DILANTIN) ER capsule 200 mg  200 mg Oral BID Kerry HoughSpencer E Simon, PA-C   200 mg at 09/15/15 0818  . QUEtiapine (SEROQUEL) tablet 100 mg  100 mg Oral QHS Adonis BrookSheila Ilir Mahrt, NP   100 mg at 09/14/15 2212  . QUEtiapine (SEROQUEL) tablet 25 mg  25 mg Oral Daily Adonis BrookSheila Shanquita Ronning, NP   25 mg at 09/15/15 0818  . traZODone (DESYREL) tablet 150 mg  150 mg Oral QHS PRN Kerry HoughSpencer E Simon, PA-C   150 mg at 09/14/15 2212    Lab Results:  No results found for this or any previous visit (from the past 48  hour(s)).  Blood Alcohol level:  Lab Results  Component Value Date   ETH <5 09/06/2015   ETH 213* 12/03/2014    Metabolic Disorder Labs: No results found for: HGBA1C, MPG No results found for: PROLACTIN No results found for: CHOL, TRIG, HDL, CHOLHDL, VLDL, LDLCALC  Physical Findings: AIMS: Facial and Oral Movements Muscles of Facial Expression: None, normal Lips and Perioral Area: None, normal Jaw: None, normal Tongue: None, normal,Extremity Movements Upper (arms, wrists, hands, fingers): None, normal Lower (legs, knees, ankles, toes): None, normal, Trunk Movements Neck, shoulders, hips: None, normal, Overall Severity Severity of abnormal movements (highest score from questions above): None, normal Incapacitation due to abnormal movements: None, normal Patient's awareness of abnormal movements (rate only patient's report): No Awareness, Dental Status Current problems with teeth and/or dentures?: No Does patient usually wear dentures?: No  CIWA:    COWS:     Musculoskeletal: Strength & Muscle Tone: within normal limits Gait & Station: normal Patient leans: N/A  Psychiatric Specialty Exam: Physical Exam  Nursing note and vitals reviewed. Psychiatric: Her mood appears anxious. Thought content is not paranoid. She exhibits a depressed mood. She expresses no homicidal and no suicidal ideation.    Review of Systems  Psychiatric/Behavioral: Positive for depression. The patient is nervous/anxious and has insomnia.   All other systems reviewed and are negative.   Blood pressure 87/59, pulse 77, temperature 98.1 F (36.7 C), temperature source Oral, resp. rate 16, height 5\' 4"  (1.626 m), weight 65.772 kg (145 lb).Body mass index is 24.88 kg/(m^2).  General Appearance: Neat  Eye Contact:  Good  Speech:  Pressured  Volume:  Normal  Mood:  Depressed  Affect:  Labile  Thought Process:  Coherent  Orientation:  Full (Time, Place, and Person)  Thought Content:  Rumination   Suicidal Thoughts:  No  Homicidal Thoughts:  No  Memory:  Immediate;   Fair Recent;   Fair Remote;   Fair  Judgement:  Impaired  Insight:  Shallow  Psychomotor Activity:  Decreased  Concentration:  Concentration: Fair and Attention Span: Fair  Recall:  [employer]Fair  Fund of Knowledge:  Fair  Language:  Good  Akathisia:  Negative  Handed:  Right  AIMS (if indicated):     Assets:  Resilience  ADL's:  Intact  Cognition:  WNL  Sleep:  Number of Hours: 5.75   Treatment Plan Summary:  Continue crisis management &  mood stabilization treatment. Medication management to re-stabilize current mood symptoms;   Sroquel 25 mg to daily and increase night time dose to 100 mg QHS mood control.  Depression: Increase the Cymbalta to 60 mg daily. Agitation: Continue Gabapentin 300 mg qid. Insomnia: Continue Trazodone 150 mg Seizures: Phenytoin 200 mg twice daily will check serum from time level tomorrow morning for therapeutic window and also to avoid toxicity.  Group counseling sessions for coping skills, encourage participation. Medical consults as needed Review and reinstate any pertinent home medications for other health problems Disposition plans are in process and [facility] [place] [place] and working with case Production designer, theatre/television/filmmanager. Length of stay 3-5 days   Velna HatchetSheila May Jakorey Mcconathy, NP-BC  09/15/2015, 3:38 PM

## 2015-09-15 NOTE — Plan of Care (Signed)
Problem: Activity: Goal: Interest or engagement in leisure activities will improve Outcome: Progressing Patient attended the group on the patio this shift. She has been interacting with peers appropriatly throughout shift.

## 2015-09-15 NOTE — Progress Notes (Signed)
D). Patient denies SI/HI/AVH. Calm and cooperative.  Patient interacting well with staff and other patients. Attending groups. On self inventory patient reports 9/10 for depression, 10/10 for hopelessness and 8/1710for anxiety. Her goal today is: "Get some hope and laughter from my peers." Took medication without difficulty. A). Emotional support and encouragement offered. Education provided on medication, indications and side effect. Q 15 minute checks done for safety. R). Continue to maintain safety checks. Continue to take medications as prescribed. Continue to complete self inventory and attend groups.
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
## 2015-09-15 NOTE — [facility] Group Notes (Signed)
[facility]  09/15/2015 8:45 AM  Participation Quality: Alert, Appropriate and Oriented  Mood/Affect: Flat  Depression Rating: 8  Anxiety Rating: 7  Thoughts of Suicide: Pt denies SI/HI  Will you contract for safety? Yes  Current AVH: Pt denies  Plan for Discharge/Comments: Pt attended discharge planning group and actively participated in group. CSW discussed suicide prevention education with the group and encouraged them to discuss discharge planning and any relevant barriers. Pt reports some improvement however continues to present as depressed. She expresses that she has decided on a discharge plan but prefers not to share it in group.  Transportation Means: Pt reports access to transportation  Supports: No supports mentioned at this time  Chad CordialLauren Carter, LCSWA 09/15/2015 10:41 AM

## 2015-09-15 NOTE — Progress Notes (Signed)
Recreation Therapy Notes  Date: 06.30.2017 Time: 9:30am Location: [address] Group Room   Group Topic: Stress Management  Goal Area(s) Addresses:  Patient will actively participate in stress management techniques presented during session.   Behavioral Response: Did not attend.   Makia Bossi L Kennett Symes, LRT/CTRS        Jefferie Holston L 09/15/2015 3:32 PM 

## 2015-09-15 NOTE — [facility] Group Notes (Signed)
Patient attend group. Her day was a 6 she focus on staying out her room to focus on social skills. She meet her goal.

## 2015-09-16 DIAGNOSIS — F4310 Post-traumatic stress disorder, unspecified: Secondary | ICD-10-CM | POA: Diagnosis present

## 2015-09-16 MED ORDER — QUETIAPINE FUMARATE 25 MG PO TABS
25.0000 mg | ORAL_TABLET | Freq: Two times a day (BID) | ORAL | Status: DC
  Administered 2015-09-16 – 2015-09-19 (×6): 25 mg via ORAL
  Filled 2015-09-16: qty 14
  Filled 2015-09-16 (×7): qty 1
  Filled 2015-09-16: qty 14
  Filled 2015-09-16: qty 1

## 2015-09-16 MED ORDER — PRAZOSIN HCL 1 MG PO CAPS
1.0000 mg | ORAL_CAPSULE | Freq: Every day | ORAL | Status: DC
  Administered 2015-09-16 – 2015-09-17 (×2): 1 mg via ORAL
  Filled 2015-09-16 (×4): qty 1

## 2015-09-16 NOTE — Progress Notes (Signed)
D.  Pt pleasant on approach, denies complaints at this time.  Positive for evening wrap up group, observed interacting appropriately with peers on the unit.  Pt denies SI/HI/hallucinations at this time.   A.  Support and encouragement offered, medications given as ordered  R.  Pt remains safe on the unit, will continue to monitor.

## 2015-09-16 NOTE — Progress Notes (Signed)
[facility]  09/16/2015 1:07 PM Katherine BrideLisa Bond  MRN:  [id number]   Subjective:  "I'm doing well and much better than yesterday. Can we go up on the Seroquel? I think I'm on 25 twice  A day and 100 at night"  Objective: Pt seen and chart reviewed. Pt is alert/oriented x4, calm, cooperative, and appropriate to situation. Pt denies suicidal/homicidal ideation and psychosis and does not appear to be responding to internal stimuli. Pt reports that she would like her Seroquel increased as she is still having some mild racing thoughts but she does perceive some benefit thus far. I informed her that we will add a second dose during the day before we increase it.    Principal Problem: MDD (major depressive disorder), recurrent severe, without psychosis (HCC) Diagnosis:   Patient Active Problem List   Diagnosis Date Noted  . PTSD (post-traumatic stress disorder) [F43.10] 09/16/2015    Priority: High  . MDD (major depressive disorder), recurrent severe, without psychosis (HCC) [F33.2]     Priority: High  . Panic disorder [F41.0] 11/05/2014  . Convulsion (HCC) [R56.9]    Total Time spent with patient: 15 minutes  Past Psychiatric History:  Major depressive disorder, recurrent   Past Medical History:  Past Medical History  Diagnosis Date  . Bipolar 1 disorder (HCC)   . Depression   . Epilepsy [facility](HCC)     Past Surgical History  Procedure Laterality Date  . Pancreaticoduodenectomy    . Cholecystectomy    . Abdominal hysterectomy      partial   . Dental surgery     Family History:  Family History  Problem Relation Age of Onset  . Cancer Mother   . Cancer Father    Family Psychiatric  History:  See HPI  Social History:  History  Alcohol Use No     History  Drug Use No    Social History   Social History  . Marital Status: Divorced    Spouse Name: N/A  . Number of Children: N/A  . Years of Education: N/A   Social History Main Topics  . Smoking status: Current Some Day  Smoker -- 1.00 packs/day for 15 years    Types: Cigarettes  . Smokeless tobacco: Never Used  . Alcohol Use: No  . Drug Use: No  . Sexual Activity: Yes   Other Topics Concern  . 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 acetaminophen (TYLENOL) tablet 650 mg  650 mg Oral Q6H PRN Kerry HoughSpencer E Simon, PA-C      . alum & mag hydroxide-simeth (MAALOX/MYLANTA) 200-200-20 MG/5ML suspension 30 mL  30 mL Oral Q4H PRN Kerry HoughSpencer E Simon, PA-C      . DULoxetine (CYMBALTA) DR capsule 60 mg  60 mg Oral Daily Adonis BrookSheila Agustin, NP   60 mg at 09/16/15 [id number]  . gabapentin (NEURONTIN) capsule 300 mg  300 mg Oral TID WC & HS Nelda MarseilleAgnes I  Nwoko, NP   300 mg at 09/16/15 1252  . hydrOXYzine (ATARAX/VISTARIL) tablet 50 mg  50 mg Oral TID Adonis BrookSheila Agustin, NP   50 mg at 09/16/15 1252  . magnesium hydroxide (MILK OF MAGNESIA) suspension 30 mL  30 mL Oral Daily PRN Kerry HoughSpencer E Simon, PA-C      . phenytoin (DILANTIN) ER capsule 200 mg  200 mg Oral BID Kerry HoughSpencer E Simon, PA-C   200 mg at 09/16/15 [id number]  . prazosin (MINIPRESS) capsule 1 mg  1 mg Oral QHS Beau FannyJohn C Jalecia Leon, FNP      . QUEtiapine (SEROQUEL) tablet 100 mg  100 mg Oral QHS Adonis BrookSheila Agustin, NP   100 mg at 09/15/15 2117  . QUEtiapine (SEROQUEL) tablet 25 mg  25 mg Oral BID Beau FannyJohn C Anihya Tuma, FNP      . traZODone (DESYREL) tablet 150 mg  150 mg Oral QHS PRN Kerry HoughSpencer E Simon, PA-C   150 mg at 09/15/15 2117     Lab Results:  No results found for this or any previous visit (from the past 48 hour(s)).  Blood Alcohol level:  Lab Results  Component Value Date   ETH <5 09/06/2015   ETH 213* 12/03/2014    Metabolic Disorder Labs: No results found for: HGBA1C, MPG No results found for: PROLACTIN No results found for: CHOL, TRIG, HDL, CHOLHDL, VLDL, LDLCALC  Physical Findings: AIMS: Facial and Oral Movements Muscles of Facial Expression: None, normal Lips and Perioral Area: None, normal Jaw: None, normal Tongue: None, normal,Extremity Movements Upper (arms, wrists, hands, fingers): None, normal Lower (legs, knees, ankles, toes): None, normal, Trunk Movements Neck, shoulders, hips: None, normal, Overall Severity Severity of abnormal movements (highest score from questions above): None, normal Incapacitation due to abnormal movements: None, normal Patient's awareness of abnormal movements (rate only patient's report): No Awareness, Dental Status Current problems with teeth and/or dentures?: No Does patient usually wear dentures?: No  CIWA:    COWS:     Musculoskeletal: Strength & Muscle Tone: within normal limits Gait & Station: normal Patient leans: N/A  Psychiatric Specialty Exam: Physical Exam  Nursing note and vitals reviewed. Psychiatric: Her mood appears anxious. Thought content is not paranoid. She exhibits a depressed mood. She expresses no homicidal and no suicidal ideation.    Review of Systems  Psychiatric/Behavioral: Positive for depression. The patient is nervous/anxious and has insomnia.   All other systems reviewed and are negative.   Blood pressure 85/65, pulse 74, temperature 97.9 F (36.6 C), temperature source Oral, resp. rate 16, height 5\' 4"  (1.626 m), weight 65.772 kg (145 lb).Body mass index is 24.88 kg/(m^2).  General Appearance: Casual and Fairly Groomed  Eye Contact:  Good  Speech:  Clear and Coherent and Normal Rate  Volume:  Normal  Mood:   Depressed  Affect:  Labile  Thought Process:  Coherent  Orientation:  Full (Time, Place, and Person)  Thought Content:  Symptoms, worries, concerns  Suicidal Thoughts:  No and contracts for safety  Homicidal Thoughts:  No  Memory:  Immediate;   Fair Recent;   Fair Remote;   Fair  Judgement:  Fair  Insight:  Shallow  Psychomotor Activity:  Normal  Concentration:  Concentration: Fair and Attention Span: Fair  Recall:  [employer]Fair  Fund of Knowledge:  Fair  Language:  Good  Akathisia:  Negative  Handed:  Right  AIMS (if indicated):     Assets:  Resilience  ADL's:  Intact  Cognition:  WNL  Sleep:  Number of Hours:  5.75   Treatment Plan Summary:  MDD (major depressive disorder), recurrent severe, without psychosis (HCC), unstable, managed as below:   Continue crisis management &  mood stabilization treatment. Medication management to re-stabilize current mood symptoms;   -Today 09/16/15, increase Seroquel to  po bid (pt stated it was 25 bid already, which it was not); Continue night time dose to 100 mg QHS mood control.   Depression: Continue the Cymbalta to 60 mg daily. Agitation: Continue Gabapentin 300 mg qid. Insomnia: Continue Trazodone 150 mg Seizures: Phenytoin 200 mg twice daily will check serum from time level tomorrow morning for therapeutic window and also to avoid toxicity.  Group counseling sessions for coping skills, encourage participation. Medical consults as needed Review and reinstate any pertinent home medications for other health problems Disposition plans are in process and patient hoping to be admitted [facility] and working with case Production designer, theatre/television/film. Length of stay 3-5 days   Beau Fanny, FNP-BC  09/16/2015, 1:07 PM

## 2015-09-16 NOTE — Progress Notes (Signed)
D Katherine Bond is seen out in the mileu on the [address] hall....she tolerates this well. She interacts with her peers appropriately  . A SHe completes her daily assessment and on it she wrote she denied SI today and she rated her depression, hopelessness and anxiety " 4/8/6", respectively. R Safety in plae.
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
## 2015-09-17 DIAGNOSIS — R451 Restlessness and agitation: Secondary | ICD-10-CM

## 2015-09-17 DIAGNOSIS — G4700 Insomnia, unspecified: Secondary | ICD-10-CM

## 2015-09-17 DIAGNOSIS — R569 Unspecified convulsions: Secondary | ICD-10-CM

## 2015-09-17 DIAGNOSIS — F17210 Nicotine dependence, cigarettes, uncomplicated: Secondary | ICD-10-CM

## 2015-09-17 NOTE — Progress Notes (Signed)
Patient ID: Katherine Bond Alford, female   DOB: 11/15/1975, 40 y.o.   MRN: [id number] [facility]  09/17/2015 2:49 PM Katherine Bond Deschamps  MRN:  [id number]   Subjective:   Patient states "I am having trouble sleeping. It's hard to focus in group today. My depression is some improved. It was off the charts when I came here. I was a ten plus now I would say it's a four. I was off my medications for a year. But I think that they are starting to help again. I was sleeping good at home so I'm not sure why I am having problems."   Objective: Pt seen and chart reviewed. Pt is alert/oriented x4, calm, cooperative, and appropriate to situation. Pt denies suicidal/homicidal ideation and psychosis and does not appear to be responding to internal stimuli. Pt reports that she would like her Seroquel increased as she is still having some mild racing thoughts but she does perceive some benefit thus far. A second dose was added yesterday. Her hs dose will be increased to 150 mg starting tonight.   Principal Problem: MDD (major depressive disorder), recurrent severe, without psychosis (HCC) Diagnosis:   Patient Active Problem List   Diagnosis Date Noted  . PTSD (post-traumatic stress disorder) [F43.10] 09/16/2015  . Panic disorder [F41.0] 11/05/2014  . Convulsion (HCC) [R56.9]   . MDD (major depressive disorder), recurrent severe, without psychosis (HCC) [F33.2]    Total Time spent with patient: 15 minutes  Past Psychiatric History:  Major depressive disorder, recurrent   Past Medical History:  Past Medical History  Diagnosis Date  . Bipolar 1 disorder (HCC)   . Depression   . Epilepsy [facility](HCC)     Past Surgical History  Procedure Laterality Date  . Pancreaticoduodenectomy    . Cholecystectomy    . Abdominal hysterectomy      partial   . Dental surgery     Family History:  Family History  Problem Relation Age of Onset  . Cancer Mother   . Cancer Father    Family Psychiatric  History:  See HPI  Social  History:  History  Alcohol Use No     History  Drug Use No    Social History   Social History  . Marital Status: Divorced    Spouse Name: N/A  . Number of Children: N/A  . Years of Education: N/A   Social History Main Topics  . Smoking status: Current Some Day Smoker -- 1.00 packs/day for 15 years    Types: Cigarettes  . Smokeless tobacco: Never Used  . Alcohol Use: No  . Drug Use: No  . Sexual Activity: Yes   Other Topics Concern  . 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 acetaminophen (TYLENOL) tablet 650 mg  650 mg Oral Q6H PRN Kerry HoughSpencer E Simon, PA-C      . alum & mag hydroxide-simeth (MAALOX/MYLANTA) 200-200-20 MG/5ML suspension 30 mL  30 mL  Oral Q4H PRN Kerry Hough, PA-C      . DULoxetine (CYMBALTA) DR capsule 60 mg  60 mg Oral Daily Adonis Brook, NP   60 mg at 09/17/15 0740  . gabapentin (NEURONTIN) capsule 300 mg  300 mg Oral TID WC & HS Sanjuana Kava, NP   300 mg at 09/17/15 1252  . hydrOXYzine (ATARAX/VISTARIL) tablet 50 mg  50 mg Oral TID Adonis Brook, NP   50 mg at 09/17/15 1251  . magnesium hydroxide (MILK OF MAGNESIA) suspension 30 mL  30 mL Oral Daily PRN Kerry Hough, PA-C      . phenytoin (DILANTIN) ER capsule 200 mg  200 mg Oral BID Kerry Hough, PA-C   200 mg at 09/17/15 0740  . prazosin (MINIPRESS) capsule 1 mg  1 mg Oral QHS Beau Fanny, FNP   1 mg at 09/16/15  2109  . QUEtiapine (SEROQUEL) tablet 100 mg  100 mg Oral QHS Adonis Brook, NP   100 mg at 09/16/15 2110  . QUEtiapine (SEROQUEL) tablet 25 mg  25 mg Oral BID Beau Fanny, FNP   25 mg at 09/17/15 0741  . traZODone (DESYREL) tablet 150 mg  150 mg Oral QHS PRN Kerry Hough, PA-C   150 mg at 09/16/15 2110    Lab Results:  No results found for this or any previous visit (from the past 48 hour(s)).  Blood Alcohol level:  Lab Results  Component Value Date   ETH <5 09/06/2015   ETH 213* 12/03/2014    Metabolic Disorder Labs: No results found for: HGBA1C, MPG No results found for: PROLACTIN No results found for: CHOL, TRIG, HDL, CHOLHDL, VLDL, LDLCALC  Physical Findings: AIMS: Facial and Oral Movements Muscles of Facial Expression: None, normal Lips and Perioral Area: None, normal Jaw: None, normal Tongue: None, normal,Extremity Movements Upper (arms, wrists, hands, fingers): None, normal Lower (legs, knees, ankles, toes): None, normal, Trunk Movements Neck, shoulders, hips: None, normal, Overall Severity Severity of abnormal movements (highest score from questions above): None, normal Incapacitation due to abnormal movements: None, normal Patient's awareness of abnormal movements (rate only patient's report): No Awareness, Dental Status Current problems with teeth and/or dentures?: No Does patient usually wear dentures?: No  CIWA:    COWS:     Musculoskeletal: Strength & Muscle Tone: within normal limits Gait & Station: normal Patient leans: N/A  Psychiatric Specialty Exam: Physical Exam  Nursing note and vitals reviewed. Psychiatric: Her mood appears anxious. Thought content is not paranoid. She exhibits a depressed mood. She expresses no homicidal and no suicidal ideation.    Review of Systems  Psychiatric/Behavioral: Positive for depression. The patient is nervous/anxious and has insomnia.   All other systems reviewed and are negative.   Blood pressure 89/63,  pulse 86, temperature 97.9 F (36.6 C), temperature source Oral, resp. rate 16, height  (1.626 m), weight 65.772 kg (145 lb).Body mass index is 24.88 kg/(m^2).  General Appearance: Casual and Fairly Groomed  Eye Contact:  Good  Speech:  Clear and Coherent and Normal Rate  Volume:  Normal  Mood:  Depressed  Affect:  Labile  Thought Process:  Coherent  Orientation:  Full (Time, Place, and Person)  Thought Content:  Symptoms, worries, concerns  Suicidal Thoughts:  No and contracts for safety  Homicidal Thoughts:  No  Memory:  Immediate;   Fair Recent;   Fair Remote;   Fair  Judgement:  Fair  Insight:  Shallow  Psychomotor Activity:  Normal  Concentration:  Concentration: Fair and Attention Span: Fair  Recall:  FiservFair  Fund of Knowledge:  Fair  Language:  Good  Akathisia:  Negative  Handed:  Right  AIMS (if indicated):     Assets:  Communication Skills Resilience  ADL's:  Intact  Cognition:  WNL  Sleep:  Number of Hours: 6   Treatment Plan Summary:  MDD (major depressive disorder), recurrent severe, without psychosis (HCC), unstable, managed as below:   Continue crisis management &  mood stabilization treatment. Medication management to re-stabilize current mood symptoms;   -Today 09/17/15, Continue Seroquel to 25mg  po bid; Increase night time dose to 150 mg QHS mood control/insomnia Depression: Continue the Cymbalta to 60 mg daily. Agitation: Continue Gabapentin 300 mg qid. Insomnia: Continue Trazodone 150 mg Seizures: Phenytoin 200 mg twice daily, serum dilantin level therapeutic at 11.6 as of 09/13/2015   Group counseling sessions for coping skills, encourage participation. Medical consults as needed Review and reinstate any pertinent home medications for other health problems Disposition plans are in process and [facility] [place] [facility] [place] and working with case Production designer, theatre/television/filmmanager. Length of stay 3-5 days  Carsyn Boster, Vernona RiegerLAURA, NP-C  09/17/2015, 2:49 PM

## 2015-09-17 NOTE — Progress Notes (Signed)
D Katherine Bond is still sad and depressed. She completes her daily assessment and on it she wrote she deneis SI and she rated her dperession, hopelessness and anxeity " 3/5/5", repsectively R Safety in place.

## 2015-09-17 NOTE — [facility] Group Notes (Signed)
[facility] LCSW Group Therapy Note    09/17/2015  10:00 AM   Participation - Present and engaged  Group was able to discuss Adjustment, Change, Power and Healing. Group was able to identify their successes and challenges with adjustments. Then group discussed progress toward change. In understanding change, clarified that deep uncontrollable changes that can't be measured in power, or lack of, but must be measured in progress toward healing. Patient worked through Doctor, general practicedeveloping plan for change in order to support progress toward goals.   1. Identified systems of coping when dealing with challenges and maintaining balance in difficult situations.  2. Identified important criteria for trusted supporters.  3. Acknowledged the importance of limits and structure. 4. Engaged in discussion about setting up goals with detailed plans to accomplish them.  5. Identified and detailed coping skills to deal with emotions in different environments.   Progress - Patient identified that she needed to work on managing mood and emotions and identified that she would be able to use professional treatment support for planning.   Beverly Sessionsywan J Palmina Clodfelter MSW, LCSW

## 2015-09-17 NOTE — Progress Notes (Signed)
Psychoeducational Group Note  Date:  09/17/2015 Time:   0900 Group Topic/Focus:  Goal Setting:  The group focuses on teaching patients how to set attainable goals that will aid them in their daily recovery.  Participation Level: Active   Participation Quality:  Good  Affect:  Flat  Cognitive:  Intact  Insight:  Fair  Engagement in Group:  Engaged  Additional Comments:   Rich BraveDuke, Tavio Biegel Lynn 10:47 AM. 09/17/2015
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
## 2015-09-17 NOTE — Progress Notes (Signed)
D.  Pt pleasant on approach, denies complaints at this time.  Due to BP being low in the AM, fluids encouraged.  Pt given pitcher of water.  Pt was positive for evening wrap up group, observed interacting appropriately with peers on the unit.  Pt denies SI/HI/hallucinations at this time.  A.  Support and encouragement offered, medication given as ordered  R.  Pt remains safe on the unit, will continue to monitor.

## 2015-09-18 MED ORDER — TRAZODONE HCL 100 MG PO TABS
100.0000 mg | ORAL_TABLET | Freq: Every evening | ORAL | Status: DC | PRN
  Administered 2015-09-19: 100 mg via ORAL
  Filled 2015-09-18: qty 7
  Filled 2015-09-18: qty 1

## 2015-09-18 MED ORDER — QUETIAPINE FUMARATE 200 MG PO TABS
200.0000 mg | ORAL_TABLET | Freq: Every day | ORAL | Status: DC
  Administered 2015-09-18: 200 mg via ORAL
  Filled 2015-09-18: qty 1
  Filled 2015-09-18: qty 7
  Filled 2015-09-18 (×2): qty 1

## 2015-09-18 NOTE — [facility] Group Notes (Signed)
[facility] LCSW Group Therapy  09/18/2015 1:15pm  Type of Therapy:  Group Therapy vercoming Obstacles  Pt did not attend, declined invitation.   Chad CordialLauren Carter, LCSWA 09/18/2015 3:10 PM

## 2015-09-18 NOTE — Progress Notes (Signed)
Patient ID: Katherine Bond, female   DOB: 12-Feb-1976, 40 y.o.   MRN: [id number] [facility] MD Progress Note  09/18/2015 4:39 PM Migdalia Olejniczak  MRN:  [id number]   Subjective:   Patient reports that in general she feels better , although still reports mood instability. She states Seroquel " is helping , but I need a higher dose." States she had been expecting dose to be increased yesterday .  Denies medication side effects .   Objective:  I have discussed case with treatment team and have met with patient. Patient is a 40 year old female, who has been diagnosed with  Mood Disorder, PTSD. She also has history of seizures , for which she is on Dilantin. States her last seizure was several weeks ago. As above, reports Seroquel has helped improve mood and decrease mood swings and instability, but feels current dose is still insufficient, and has ongoing symptoms, albeit partially improved . Denies medication side effects, which we have reviewed . No akathisia, no abnormal involuntary movements noted or reported, denies sedation or increased appetite at this time . Visible on unit, going to some groups, behavior on unit in good control .  Principal Problem: MDD (major depressive disorder), recurrent severe, without psychosis ([place]) Diagnosis:   Patient Active Problem List   Diagnosis Date Noted  . PTSD (post-traumatic stress disorder) [F43.10] 09/16/2015  . Panic disorder [F41.0] 11/05/2014  . Convulsion ([place]) [R56.9]   . MDD (major depressive disorder), recurrent severe, without psychosis ([place]) [F33.2]    Total Time spent with patient: 20 minutes   Past Psychiatric History:  Major depressive disorder, recurrent   Past Medical History:  Past Medical History  Diagnosis Date  . Bipolar 1 disorder ([place])   . Depression   . Epilepsy [facility])     Past Surgical History  Procedure Laterality Date  . Pancreaticoduodenectomy    . Cholecystectomy    . Abdominal hysterectomy      partial   . Dental surgery      Family History:  Family History  Problem Relation Age of Onset  . Cancer Mother   . Cancer Father    Family Psychiatric  History:  See HPI  Social History:  History  Alcohol Use No     History  Drug Use No    Social History   Social History  . Marital Status: Divorced    Spouse Name: N/A  . Number of Children: N/A  . Years of Education: N/A   Social History Main Topics  . Smoking status: Current Some Day Smoker -- 1.00 packs/day for 15 years    Types: Cigarettes  . Smokeless tobacco: Never Used  . Alcohol Use: No  . Drug Use: No  . Sexual Activity: Yes   Other Topics Concern  . 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 acetaminophen (TYLENOL) tablet 650 mg  650 mg Oral Q6H PRN Laverle Hobby, PA-C      .  alum & mag hydroxide-simeth (MAALOX/MYLANTA) 200-200-20 MG/5ML suspension 30 mL  30 mL Oral Q4H PRN Laverle Hobby, PA-C      . DULoxetine (CYMBALTA) DR capsule 60 mg  60 mg Oral Daily Kerrie Buffalo, NP   60 mg at 09/18/15 0800  . gabapentin (NEURONTIN) capsule 300 mg  300 mg Oral TID WC & HS Encarnacion Slates, NP   300 mg at 09/18/15 1321  . hydrOXYzine (ATARAX/VISTARIL) tablet 50 mg  50 mg Oral TID Kerrie Buffalo, NP   50 mg at 09/18/15 1321  . magnesium hydroxide (MILK OF MAGNESIA) suspension 30 mL  30 mL Oral Daily PRN Laverle Hobby, PA-C      . phenytoin (DILANTIN) ER  capsule 200 mg  200 mg Oral BID Laverle Hobby, PA-C   200 mg at 09/18/15 0759  . prazosin (MINIPRESS) capsule 1 mg  1 mg Oral QHS Benjamine Mola, FNP   1 mg at 09/17/15 2115  . QUEtiapine (SEROQUEL) tablet 100 mg  100 mg Oral QHS Kerrie Buffalo, NP   100 mg at 09/17/15 2114  . QUEtiapine (SEROQUEL) tablet 25 mg  25 mg Oral BID Benjamine Mola, FNP   25 mg at 09/18/15 0800  . traZODone (DESYREL) tablet 150 mg  150 mg Oral QHS PRN Laverle Hobby, PA-C   150 mg at 09/17/15 2115    Lab Results:  No results found for this or any previous visit (from the past 48 hour(s)).  Blood Alcohol level:  Lab Results  Component Value Date   ETH <5 09/06/2015   ETH 213* 16/96/7893    Metabolic Disorder Labs: No results found for: HGBA1C, MPG No results found for: PROLACTIN No results found for: CHOL, TRIG, HDL, CHOLHDL, VLDL, LDLCALC  Physical Findings: AIMS: Facial and Oral Movements Muscles of Facial Expression: None, normal Lips and Perioral Area: None, normal Jaw: None, normal Tongue: None, normal,Extremity Movements Upper (arms, wrists, hands, fingers): None, normal Lower (legs, knees, ankles, toes): None, normal, Trunk Movements Neck, shoulders, hips: None, normal, Overall Severity Severity of abnormal movements (highest score from questions above): None, normal Incapacitation due to abnormal movements: None, normal Patient's awareness of abnormal movements (rate only patient's report): No Awareness, Dental Status Current problems with teeth and/or dentures?: No Does patient usually wear dentures?: No  CIWA:    COWS:     Musculoskeletal: Strength & Muscle Tone: within normal limits Gait & Station: normal Patient leans: N/A  Psychiatric Specialty Exam: Physical Exam  Nursing note and vitals reviewed. Psychiatric: Her mood appears anxious. Thought content is not paranoid. She exhibits a depressed mood. She expresses no homicidal and no suicidal ideation.    Review of Systems   Psychiatric/Behavioral: Positive for depression. The patient is nervous/anxious and has insomnia.   All other systems reviewed and are negative.   Blood pressure 98/61, pulse 87, temperature 98.9 F (37.2 C), temperature source Oral, resp. rate 16, height _0  (1.626 m), weight 145 lb (65.772 kg).Body mass index is 24.88 kg/(m^2).  General Appearance: Fairly Groomed  Eye Contact:  Good  Speech:  Clear and Coherent and Normal Rate  Volume:  Normal  Mood:  Partially improved, but still depressed, labile   Affect:  Labile  Thought Process:  Goal Directed  Orientation:  Full (Time, Place, and Person)  Thought Content:  Denies hallucinations, no delusions expressed   Suicidal Thoughts: Denies suicidal ideations at this time, contracts for safety on the unit   Homicidal Thoughts:  No  Memory:  Recent and remote grossly intact   Judgement:  Improving   Insight:  Improving   Psychomotor Activity:  Normal  Concentration:  Concentration: Good and Attention Span: Good  Recall:  Good  Fund of Knowledge:  Good  Language:  Good  Akathisia:  Negative  Handed:  Right  AIMS (if indicated):     Assets:  Communication Skills Resilience  ADL's:  Intact  Cognition:  WNL  Sleep:  Number of Hours: 6  Assessment - patient reports partial improvement thus far, feels Seroquel has been partially effective, but reports ongoing subjective sense of mood instability, lability. No SI. History of seizure disorder, currently on Dilantin, no side effects. Has had  no  seizure activity on unit .   Treatment Plan Summary:    Continue to encourage group /milieu participation to work on coping skills and symptom reduction Increase Seroquel to 25 mgrs BID and 200 mgrs QHS for mood disorder  Continue  Cymbalta to 60 mg QDAY for depression, anxiety  Continue Gabapentin 300 mg QID for anxiety as needed . Continue Dilantin 200 mgrs BID for history of seizures  Decrease  Trazodone to 100 mgrs QHS PRN for insomnia   Disposition planning in progress. She is wanting [place] to   [facility] in [place] at discharge   Neita Garnet, MD  09/18/2015, 4:39 PM

## 2015-09-18 NOTE — Progress Notes (Signed)
The focus of this group is to help patients review their daily goal of treatment and discuss progress on daily workbooks.  Patient attended group today and participated. She states that her goal of the day was to sleep better and talk to the doctor. She states that she achieved her goal and that her goal for tomorrow is to get discharged.

## 2015-09-18 NOTE — Tx Team (Signed)
Interdisciplinary Treatment Plan Update (Adult) Date: 09/18/2015   Date: 09/18/2015 9:58 AM  Progress in Treatment:  Attending groups: Yes Participating in groups: Yes Taking medication as prescribed: Yes  Tolerating medication: Yes  Family/Significant othe contact made: No, CSW attempting to make contact with boyfriend Patient understands diagnosis: Yes AEB seeking help for depression  Discussing patient identified problems/goals with staff: Yes  Medical problems stabilized or resolved: Yes  Denies suicidal/homicidal ideation: Yes Patient has not harmed self or Others: Yes   New problem(s) identified: None identified at this time.   Discharge Plan or Barriers: CSW will assess for appropriate discharge plan and relevant barriers.   09/12/15: Pt considering [facility] at discharge. Follow-up to be determined when discharge plan is determined  09/18/2015: Pt will return home with boyfriend and follow-up with outpatient services.   Additional comments:  Patient and CSW reviewed pt's identified goals and treatment plan. Patient verbalized understanding and agreed to treatment plan.   Reason for Continuation of Hospitalization:  Anxiety Depression Medication stabilization Suicidal ideation  Estimated length of stay: 1 day  Review of initial/current patient goals per problem list:   1.  Goal(s): Patient will participate in aftercare plan  Met:  Yes  Target date: 3-5 days from date of admission   As evidenced by: Patient will participate within aftercare plan AEB aftercare provider and housing plan at discharge being identified.  09/07/15: CSW to work with Pt to assess for appropriate discharge plan and faciliate appointments and referrals as needed prior to d/c. 09/12/15:  Pt considering [facility] at discharge. Follow-up to be determined when discharge plan is determined 09/18/2015: Pt will return home with boyfriend and follow-up with outpatient services.   2.  Goal (s):  Patient will exhibit decreased depressive symptoms and suicidal ideations.  Met:  Yes  Target date: 3-5 days from date of admission   As evidenced by: Patient will utilize self rating of depression at 3 or below and demonstrate decreased signs of depression or be deemed stable for discharge by MD. 09/07/15: Pt was admitted with symptoms of depression, rating 10/10. Pt continues to present with flat affect and depressive symptoms.  Pt will demonstrate decreased symptoms of depression and rate depression at 3/10 or lower prior to discharge. 09/12/15: Pt rates depression at 6/10; denies SI. Affect and engagement in therapy is improving. 09/18/2015: Pt rates depression at 0/10; denies SI  3.  Goal(s): Patient will demonstrate decreased signs and symptoms of anxiety.  Met:  Yes  Target date: 3-5 days from date of admission   As evidenced by: Patient will utilize self rating of anxiety at 3 or below and demonstrated decreased signs of anxiety, or be deemed stable for discharge by MD 09/07/15: Pt was admitted with increased levels of anxiety and is currently rating those symptoms highly. Pt will demonstrated decreased symptoms of anxiety and rate it at 3/10 prior to d/c. 09/12/15: Pt rates anxiety at 8/10 09/18/2015: Pt rates anxiety at 0/10  Attendees:  Patient:    Family:    Physician: Dr. Parke Poisson, MD  09/18/2015 9:58 AM  Nursing: Rayburn Go, RN; Loletta Specter, RN 09/18/2015 9:58 AM  Clinical Social Worker Peri Maris, [place] 09/18/2015 9:58 AM  Other: Tilden Fossa, [place] 09/18/2015 9:58 AM  Clinical:  09/18/2015 9:58 AM  Other:  09/18/2015 9:58 AM  Other:     Peri Maris, [place] Work [phone]

## 2015-09-18 NOTE — Progress Notes (Signed)
D: Patient's self inventory sheet: patient has poor sleep, requested sleep medication without relief.Fair  Appetite, low energy level, poor concentration. Rated depression 0/10, hopeless 0/10, anxiety 0/10. SI/HI/AVH: Denies. Physical complaints are denied. Goal is "energy". Plans to work on "talking to Dr.". Angry that Seroquel dose has not been adjusted as she expects..   A: Medications administered, assessed medication knowledge and education given on medication regimen.  Emotional support and encouragement given patient. R: denies SI and HI , contracts for safety. Safety maintained with 15 minute checks.

## 2015-09-18 NOTE — Progress Notes (Signed)
Recreation Therapy Notes  Date: 07.03.2017 Time: 9:30am Location: [address] Group Room   Group Topic: Stress Management  Goal Area(s) Addresses:  Patient will actively participate in stress management techniques presented during session.   Behavioral Response: Engaged, Attentive   Intervention: Stress management techniques  Activity :  Diaphragmatic Breathing and Guided Imagery. LRT provided education, instruction and demonstration on practice of Diaphragmatic Breathing and Guided Imagery. Patient was asked to participate in technique introduced during session.   Education:  Stress Management, Discharge Planning.   Education Outcome: Acknowledges education  Clinical Observations/Feedback: Patient actively engaged in technique introduced, expressed no concerns and demonstrated ability to practice independently post d/c.   Katherine Bond L Sanjit Mcmichael, LRT/CTRS        Katherine Bond L 09/18/2015 1:59 PM 

## 2015-09-18 NOTE — Progress Notes (Signed)
Adult Psychoeducational Group Note  Date:  09/18/2015 Time:  12:38 AM  Group Topic/Focus:  Wrap-Up Group:   The focus of this group is to help patients review their daily goal of treatment and discuss progress on daily workbooks.  Participation Level:  Active  Participation Quality:  Appropriate, Attentive and Sharing  Affect:  Appropriate  Cognitive:  Alert, Appropriate and Oriented  Insight: Appropriate  Engagement in Group:  Engaged  Modes of Intervention:  Discussion and Support  Additional Comments:   Patient attended wrap-up group. She stated that she did not have a good day because she was tired due to her inability to sleep last night. Her healthy support system is her boyfriend.  She also intends to get back to AA meetings when she leaves here.  Jashawn Floyd W Tannisha Kennington 09/18/2015, 12:38 AM

## 2015-09-19 MED ORDER — DULOXETINE HCL 60 MG PO CPEP: 60.0000 mg | ORAL_CAPSULE | Freq: Every day | ORAL | Status: DC

## 2015-09-19 MED ORDER — TRAZODONE HCL 100 MG PO TABS: 100.0000 mg | ORAL_TABLET | Freq: Every evening | ORAL | Status: DC | PRN

## 2015-09-19 MED ORDER — QUETIAPINE FUMARATE 200 MG PO TABS: 200.0000 mg | ORAL_TABLET | Freq: Every day | ORAL | Status: DC

## 2015-09-19 MED ORDER — PHENYTOIN SODIUM EXTENDED 200 MG PO CAPS: 200.0000 mg | ORAL_CAPSULE | Freq: Two times a day (BID) | ORAL | Status: DC

## 2015-09-19 MED ORDER — GABAPENTIN 300 MG PO CAPS: 300.0000 mg | ORAL_CAPSULE | Freq: Three times a day (TID) | ORAL | Status: DC

## 2015-09-19 MED ORDER — HYDROXYZINE HCL 50 MG PO TABS: 50.0000 mg | ORAL_TABLET | Freq: Three times a day (TID) | ORAL | Status: DC

## 2015-09-19 MED ORDER — QUETIAPINE FUMARATE 25 MG PO TABS: 25.0000 mg | ORAL_TABLET | Freq: Two times a day (BID) | ORAL | Status: DC

## 2015-09-19 NOTE — Progress Notes (Signed)
Recreation Therapy Notes  Animal-Assisted Activity (AAA) Program Checklist/Progress Notes Patient Eligibility Criteria Checklist & Daily Group note for Rec TxIntervention  Date: 07.04.2017 Time: 11:05am Location: [address]    AAA/T Program Assumption of Risk Form signed by Patient/ or Parent Legal Guardian Yes  Patient is free of allergies or sever asthma Yes  Patient reports no fear of animals Yes  Patient reports no history of cruelty to animals Yes  Patient understands his/her participation is voluntary Yes  Behavioral Response: Did not attend.   Marykay Lexenise L Alin Hutchins, LRT/CTRS        Robi Mitter L 09/19/2015 11:25 AM

## 2015-09-19 NOTE — Progress Notes (Signed)
Pt d/c from the hospital. All items returned. D/C instructions given, prescriptions given and samples given. Pt denies si and hi. 

## 2015-09-19 NOTE — [facility] Suicide Risk Assessment (Signed)
[facility] INPATIENT:  Family/Significant Other Suicide Prevention Education  Suicide Prevention Education:  Education Completed; boyfriend Katherine Bond [phone],  (name of family member/significant other) has been identified by the patient as the family member/significant other with whom the patient will be residing, and identified as the person(s) who will aid the patient in the event of a mental health crisis (suicidal ideations/suicide attempt).  With written consent from the patient, the family member/significant other has been provided the following suicide prevention education, prior to the and/or following the discharge of the patient.  The suicide prevention education provided includes the following:  Suicide risk factors  Suicide prevention and interventions  National Suicide Hotline telephone number  [facility] assessment telephone number  [facility] Emergency Assistance 911  [facility] and/or Residential Mobile Crisis Unit telephone number  Request made of family/significant other to:  Remove weapons (e.g., guns, rifles, knives), all items previously/currently identified as safety concern.    Remove drugs/medications (over-the-counter, prescriptions, illicit drugs), all items previously/currently identified as a safety concern.  The family member/significant other verbalizes understanding of the suicide prevention education information provided.  The family member/significant other agrees to remove the items of safety concern listed above.  Katherine Bond, Katherine Bond 09/19/2015, 11:52 AM

## 2015-09-19 NOTE — [facility] Suicide Risk Assessment (Addendum)
[facility]   Principal Problem: MDD (major depressive disorder), recurrent severe, without psychosis (HCC) Discharge Diagnoses:  Patient Active Problem List   Diagnosis Date Noted  . PTSD (post-traumatic stress disorder) [F43.10] 09/16/2015  . Panic disorder [F41.0] 11/05/2014  . Convulsion (HCC) [R56.9]   . MDD (major depressive disorder), recurrent severe, without psychosis (HCC) [F33.2]     Total Time spent with patient: 30 minutes  Musculoskeletal: Strength & Muscle Tone: within normal limits Gait & Station: normal Patient leans: N/A  Psychiatric Specialty Exam: ROS denies headache, denies visual disturbances, denies chest pain or shortness of breath, denies nausea, no vomiting  Blood pressure 78/56, pulse 72, temperature 98.3 F (36.8 C), temperature source Oral, resp. rate 12, height 5\' 4"  (1.626 m), weight 145 lb (65.772 kg).Body mass index is 24.88 kg/(m^2).  General Appearance: Well Groomed  Eye Contact::  Good  Speech:  Normal Rate409  Volume:  Normal  Mood:  improved mood, states " my mood is pretty good right now", seems euthymic   Affect:  Appropriate and reactive  Thought Process:  Linear  Orientation:  Full (Time, Place, and Person)  Thought Content:  denies hallucinations, no delusions   Suicidal Thoughts:  No- denies suicidal ideations, denies any self injurious ideations   Homicidal Thoughts:  No denies any homicidal ideations   Memory:  recent and remote grossly intact   Judgement:  Other:  improved   Insight:  Present  Psychomotor Activity:  Normal  Concentration:  Good  Recall:  Good  Fund of Knowledge:Good  Language: Good  Akathisia:  No  Handed:  Right  AIMS (if indicated):     Assets:  Communication Skills Desire for Improvement Social Support  Sleep:  Number of Hours: 5.75  Cognition: WNL  ADL's:  Intact   Mental Status Per Nursing Assessment::   On Admission:     Demographic Factors:  40 year old female,  lives with boyfriend, no children, currently unemployed   Loss Factors: Unemployment , loss of dentures   Historical Factors: History of depression, history of PTSD, history of Alcohol Abuse in the past . Has history of Seizure Disorder   Risk Reduction Factors:   Living with another person, especially a relative and Positive coping skills or problem solving skills  Continued Clinical Symptoms:  At this time patient reports improved mood, and presents with brighter, fuller range of affect, no thought disorder, no SI , no HI, no psychotic symptoms, future oriented, states she feels optimistic and ready to discharge today. Denies medication side effects, which we have reviewed.   Cognitive Features That Contribute To Risk:  No gross cognitive deficits noted upon discharge. Is alert , attentive, and oriented x 3    Suicide Risk:  Mild:  Suicidal ideation of limited frequency, intensity, duration, and specificity.  There are no identifiable plans, no associated intent, mild dysphoria and related symptoms, good self-control (both objective and subjective assessment), few other risk factors, and identifiable protective factors, including available and accessible social support.  Follow-up Information    Follow up with [facility].   Specialty:  Professional Counselor   Why:  Please walk-in between 8am-12pm Monday-Friday to be set up for medication management and therapy.   Contact information:   [address] [place] [phone]       Plan Of Care/Follow-up recommendations:  Activity:  as tolerated  Diet:  Regular  Tests:  NA Other:  See below  Patient  is requesting discharge and there are no current grounds for involuntary commitment  She is leaving unit in good spirits Plans to return home. She plans to follow up as above - states that her Seizure Disorder [facility]. Nehemiah MassedOBOS, Jamair Cato, MD 09/19/2015, 10:10  AM

## 2015-09-19 NOTE — Discharge Summary (Signed)
Physician Discharge Summary Note  Patient:  Katherine Bond is an 40 y.o., female MRN:  [id number] DOB:  04/23/1975 Patient phone:  [phone] (home)  Patient address:   [address] [place] [address],  Total Time spent with patient: Greater than 30 minutes  Date of Admission:  09/07/2015  Date of Discharge: 09-19-15  Reason for Admission: Worsening symptoms of depression  Principal Problem: MDD (major depressive disorder), recurrent severe, without psychosis [name])  Discharge Diagnoses: Patient Active Problem List   Diagnosis Date Noted  . PTSD (post-traumatic stress disorder) [F43.10] 09/16/2015  . Panic disorder [F41.0] 11/05/2014  . Convulsion (HCC) [R56.9]   . MDD (major depressive disorder), recurrent severe, without psychosis (HCC) [F33.2]    Musculoskeletal: Strength & Muscle Tone: within normal limits Gait & Station: normal Patient leans: N/A  Psychiatric Specialty Exam: Physical Exam  Constitutional: She is oriented to person, place, and time. She appears well-developed.  HENT:  Head: Normocephalic.  Eyes: Pupils are equal, round, and reactive to light.  Neck: Normal range of motion.  Cardiovascular: Normal rate.   Respiratory: Effort normal.  GI: Soft.  Genitourinary:  Denies any issues in this area.  Musculoskeletal: Normal range of motion.  Neurological: She is alert and oriented to person, place, and time.  Skin: Skin is warm and dry.  Psychiatric: Her speech is normal and behavior is normal. Judgment and thought content normal. Her mood appears not anxious. Her affect is not angry, not blunt, not labile and not inappropriate. Cognition and memory are normal. She does not exhibit a depressed mood.    Review of Systems  Constitutional: Negative.   HENT: Negative.   Eyes: Negative.   Respiratory: Negative.   Cardiovascular: Negative.   Gastrointestinal: Negative.   Genitourinary: Negative.   Musculoskeletal: Negative.   Skin: Negative.    Neurological: Negative.   Endo/Heme/Allergies: Negative.   Psychiatric/Behavioral: Positive for depression (Stable). Negative for suicidal ideas, hallucinations, memory loss and substance abuse. The patient has insomnia (Stable). The patient is not nervous/anxious.     Blood pressure 78/56, pulse 72, temperature 98.3 F (36.8 C), temperature source Oral, resp. rate 12, height 5\' 4"  (1.626 m), weight 65.772 kg (145 lb).Body mass index is 24.88 kg/(m^2).  See Md's SRA  Have you used any form of tobacco in the last 30 days? (Cigarettes, Smokeless Tobacco, Cigars, and/or Pipes): Yes  Has this patient used any form of tobacco in the last 30 days? (Cigarettes, Smokeless Tobacco, Cigars, and/or Pipes): Yes, A prescription for an FDA-approved tobacco cessation medication was offered at discharge and the patient refused  Past Medical History:  Past Medical History  Diagnosis Date  . Bipolar 1 disorder (HCC)   . Depression   . Epilepsy [facility])     Past Surgical History  Procedure Laterality Date  . Pancreaticoduodenectomy    . Cholecystectomy    . Abdominal hysterectomy      partial   . Dental surgery     Family History:  Family History  Problem Relation Age of Onset  . Cancer Mother   . Cancer Father    Social History:  History  Alcohol Use No     History  Drug Use No    Social History   Social History  . Marital Status: Divorced    Spouse Name: N/A  . Number of Children: N/A  . Years of Education: N/A   Social History Main Topics  . Smoking status: Current Some Day Smoker -- 1.00 packs/day  for 15 years    Types: Cigarettes  . Smokeless tobacco: Never Used  . Alcohol Use: No  . Drug Use: No  . Sexual Activity: Yes   Other Topics Concern  . None   Social History Narrative   Risk to Self: Is patient at risk for suicide?: Yes Risk to Others: No Prior Inpatient Therapy: Yes Prior Outpatient Therapy: Yes  Level of Care:  OP  Hospital Course:  Katherine Bond is a 40  y.o. Female, was brought in by [employer]. Having suicidal ideations.Today when she was seen in assessment, she stated that she lost her teeth in the commode and she does not have teeth to look for a job, she can't afford new ones, her boyfriend kicked her out and her children don't want to have anything to do with her."I don't have much to live for. I can't stand my life." Upon admission, she had found a razor today and thought about using it to cut her jugular vein. Patient admits to not be taking her medications since March "because they weren't working for me." She said that this included her Dilantin. She reports having one seizure either Monday or Tuesday of this week. Patient stopped going to her psychiatrist, Dartha Lodge in March also.  Katherine Bond was admitted to the hospital with a complain of worsening symptoms of depression leading to suicidal ideations with plans to cut her jugular vein with a razor. She presented as her stressors/trigger as lack of family support, joblessness & financial difficulties. She was in need of mood stabilization treatments. After evaluation of her symptoms, Katherine Bond's symptoms were identified. The medication management for her symptoms were discussed & initiated.  She was medicated and discharged on; Seroquel 200 mg for mood control, Seroquel 25 mg for agitation, Hydroxyzine 25 mg prn for anxiety, Gabapentin 300 mg for agitation, Duloxetine 60 mg for depression & Trazodone 100 mg insomnia. She was resumed on all her pertinent home medications for her other pre-existing medical issues that she presented. She tolerated her treatment regimen without any significant adverse effects and or reactions reported. Katherine Bond was also enrolled and participated in the group counseling sessions being offered and held on this unit. She learned coping skills that should help her cope better after discharge to maintain mood stability.  During the course of her treatment, Katherine Bond was noted to be  motivated for recovery. She worked closely with the treatment team and case managers to develop a discharge plan with appropriate goals to maintain mood stability. Coping skills, problem solving as well as relaxation therapies were part of her unit programming. Taran's symptoms responded well to her treatment regimen. This is evidenced by her reports of improved mood & absence of suicidal ideations. She is currently being discharged to continue psychiatric treatment on an outpatient basis. Upon discharge she was in much improved condition than upon admission. Her symptoms were reported as significantly decreased or resolved completely. She currently denies any SIHI, AVH, delusional thoughts and or paranoia. she was motivated to continue taking medications with a goal of continued improvement in mental health. She will follow-up care for medication management/routine psychiatric care as noted below. She was provided with all the pertinent information required to make this appointment without problems. She received a 7 days worth, supply samples of her [facility] discharge medications. Transportation per her arrangement.  Consults:  psychiatry  Discharge Vitals:   Blood pressure 78/56, pulse 72, temperature 98.3 F (36.8 C), temperature source Oral, resp. rate 12, height  (1.626  m), weight 65.772 kg (145 lb). Body mass index is 24.88 kg/(m^2). Lab Results:   No results found for this or any previous visit (from the past 72 hour(s)).  Physical Findings: AIMS: Facial and Oral Movements Muscles of Facial Expression: None, normal Lips and Perioral Area: None, normal Jaw: None, normal Tongue: None, normal,Extremity Movements Upper (arms, wrists, hands, fingers): None, normal Lower (legs, knees, ankles, toes): None, normal, Trunk Movements Neck, shoulders, hips: None, normal, Overall Severity Severity of abnormal movements (highest score from questions above): None, normal Incapacitation due to abnormal  movements: None, normal Patient's awareness of abnormal movements (rate only patient's report): No Awareness, Dental Status Current problems with teeth and/or dentures?: No Does patient usually wear dentures?: No  CIWA:    COWS:     See Psychiatric Specialty Exam and Suicide Risk Assessment completed by Attending Physician prior to discharge.  Discharge destination:  Home  Is patient on multiple antipsychotic therapies at discharge:  No   Has Patient had three or more failed trials of antipsychotic monotherapy by history:  No  Recommended Plan for Multiple Antipsychotic Therapies: NA    Medication List    STOP taking these medications        diazepam 5 MG tablet  Commonly known as:  VALIUM     nicotine 21 mg/24hr patch  Commonly known as:  NICODERM CQ - dosed in mg/24 hours      TAKE these medications      Indication   DULoxetine 60 MG capsule  Commonly known as:  CYMBALTA  Take 1 capsule (60 mg total) by mouth daily. For depression   Indication:  Major Depressive Disorder     gabapentin 300 MG capsule  Commonly known as:  NEURONTIN  Take 1 capsule (300 mg total) by mouth 4 (four) times daily -  with meals and at bedtime. For agitation   Indication:  Agitation     hydrOXYzine 50 MG tablet  Commonly known as:  ATARAX/VISTARIL  Take 1 tablet (50 mg total) by mouth 3 (three) times daily. For anxiety   Indication:  Anxiety Neurosis     phenytoin 200 MG ER capsule  Commonly known as:  DILANTIN  Take 1 capsule (200 mg total) by mouth 2 (two) times daily. For seizure disorder   Indication:  Seizure     QUEtiapine 200 MG tablet  Commonly known as:  SEROQUEL  Take 1 tablet (200 mg total) by mouth at bedtime. For mood control   Indication:  Mood control     QUEtiapine 25 MG tablet  Commonly known as:  SEROQUEL  Take 1 tablet (25 mg total) by mouth 2 (two) times daily. For agitation   Indication:  Agitation     traZODone 100 MG tablet  Commonly known as:  DESYREL   Take 1 tablet (100 mg total) by mouth at bedtime as needed for sleep.   Indication:  Trouble Sleeping       Follow-up Information    Follow up with [facility].   Specialty:  Professional Counselor   Why:  Please walk-in between 8am-12pm Monday-Friday to be set up for medication management and therapy.   Contact information:   [address] [place] [address] [phone]      Follow-up recommendations: Activity:  As tolerated Diet: As recommended by your primary care doctor. Keep all scheduled follow-up appointments as recommended.   Comments: Take all your medications as prescribed by your mental healthcare provider. Report any  adverse effects and or reactions from your medicines to your outpatient provider promptly. Patient is instructed and cautioned to not engage in alcohol and or illegal drug use while on prescription medicines. In the event of worsening symptoms, patient is instructed to call the crisis hotline, 911 and or go to the nearest ED for appropriate evaluation and treatment of symptoms. Follow-up with your primary care provider for your other medical issues, concerns and or health care needs.   Signed: Armandina StammerNwoko, Agnes I, Greater than 30 minutes 09/19/2015, 2:42 PM    Patient seen, Suicide Assessment Completed.  Disposition Plan Reviewed

## 2015-09-19 NOTE — Progress Notes (Signed)
  [facility] :  Will you be returning to the same living situation after discharge:  Yes,  patient plans to return home At discharge, do you have transportation home?: Yes,  friend/family Do you have the ability to pay for your medications: Yes,  patient will be provided with prescriptions at discharge  Release of information consent forms completed and in the chart;  Patient's signature needed at discharge.  Patient to Follow up at: Follow-up Information    Follow up with [facility].   Specialty:  Professional Counselor   Why:  Please walk-in between 8am-12pm Monday-Friday to be set up for medication management and therapy.   Contact information:   [address] [place] [address] [phone]       Next level of care provider has access to [facility] Link:no  Safety Planning and Suicide Prevention discussed: Yes,  with patient  Have you used any form of tobacco in the last 30 days? (Cigarettes, Smokeless Tobacco, Cigars, and/or Pipes): Yes  Has patient been referred to the Quitline?: Patient refused referral  Patient has been referred for addiction treatment: Yes  Marisa Hufstetler, [place] CarboKristin L 09/19/2015, 8:56 AM

## 2015-09-19 NOTE — Progress Notes (Signed)
  D: Writer introduced herself to the pt. Informed the writer that she hadn't slept in 3 days. Stated, "they kept changing my meds. Hopefully I discharge tomorrow. Now I don't have anxiety, depression, hopelessness. My medicines are workingPatent attorney" Writer informed pt that her seroque was increased from 100 mg to 200 mg. Pt has no questions or concerns.   A:  Support and encouragement was offered. 15 min checks continued for safety.  R: Pt remains safe.

## 2015-09-26 ENCOUNTER — Emergency Department (HOSPITAL_COMMUNITY)
Admission: EM | Admit: 2015-09-26 | Discharge: 2015-09-27 | Disposition: A | Discharge status: Home / Self Care | Payer: Medicaid - Out of State | Attending: Emergency Medicine | Admitting: Emergency Medicine

## 2015-09-26 ENCOUNTER — Encounter (HOSPITAL_COMMUNITY): Payer: Self-pay | Admitting: *Deleted

## 2015-09-26 DIAGNOSIS — F17210 Nicotine dependence, cigarettes, uncomplicated: Secondary | ICD-10-CM | POA: Insufficient documentation

## 2015-09-26 DIAGNOSIS — G40909 Epilepsy, unspecified, not intractable, without status epilepticus: Secondary | ICD-10-CM | POA: Diagnosis not present

## 2015-09-26 DIAGNOSIS — F1094 Alcohol use, unspecified with alcohol-induced mood disorder: Secondary | ICD-10-CM | POA: Diagnosis not present

## 2015-09-26 DIAGNOSIS — Z79899 Other long term (current) drug therapy: Secondary | ICD-10-CM | POA: Insufficient documentation

## 2015-09-26 DIAGNOSIS — F4310 Post-traumatic stress disorder, unspecified: Secondary | ICD-10-CM | POA: Diagnosis not present

## 2015-09-26 DIAGNOSIS — F332 Major depressive disorder, recurrent severe without psychotic features: Secondary | ICD-10-CM

## 2015-09-26 DIAGNOSIS — R45851 Suicidal ideations: Secondary | ICD-10-CM | POA: Diagnosis present

## 2015-09-26 DIAGNOSIS — F1010 Alcohol abuse, uncomplicated: Secondary | ICD-10-CM | POA: Diagnosis present

## 2015-09-26 DIAGNOSIS — Z049 Encounter for examination and observation for unspecified reason: Secondary | ICD-10-CM

## 2015-09-26 DIAGNOSIS — Z7982 Long term (current) use of aspirin: Secondary | ICD-10-CM | POA: Insufficient documentation

## 2015-09-26 LAB — COMPREHENSIVE METABOLIC PANEL
ALBUMIN: 4.6 g/dL (ref 3.5–5.0)
ALT: 30 U/L (ref 14–54)
AST: 25 U/L (ref 15–41)
Alkaline Phosphatase: 58 U/L (ref 38–126)
Anion gap: 9 (ref 5–15)
BILIRUBIN TOTAL: 0.6 mg/dL (ref 0.3–1.2)
BUN: 15 mg/dL (ref 6–20)
CHLORIDE: 107 mmol/L (ref 101–111)
CO2: 26 mmol/L (ref 22–32)
Calcium: 8.5 mg/dL — ABNORMAL LOW (ref 8.9–10.3)
Creatinine, Ser: 0.64 mg/dL (ref 0.44–1.00)
GFR calc Af Amer: >60 mL/min (ref >60)
GFR calc non Af Amer: >60 mL/min (ref >60)
GLUCOSE: 82 mg/dL (ref 65–99)
POTASSIUM: 4 mmol/L (ref 3.5–5.1)
Sodium: 142 mmol/L (ref 135–145)
TOTAL PROTEIN: 6.8 g/dL (ref 6.5–8.1)

## 2015-09-26 LAB — RAPID URINE DRUG SCREEN, HOSP PERFORMED
Amphetamines: NOT DETECTED
BARBITURATES: NOT DETECTED
Benzodiazepines: NOT DETECTED
Cocaine: NOT DETECTED
Opiates: NOT DETECTED
TETRAHYDROCANNABINOL: NOT DETECTED

## 2015-09-26 LAB — CBC
HEMATOCRIT: 42.7 % (ref 36.0–46.0)
HEMOGLOBIN: 14.6 g/dL (ref 12.0–15.0)
MCH: 30.1 pg (ref 26.0–34.0)
MCHC: 34.2 g/dL (ref 30.0–36.0)
MCV: 88 fL (ref 78.0–100.0)
Platelets: 230 10*3/uL (ref 150–400)
RBC: 4.85 MIL/uL (ref 3.87–5.11)
RDW: 14 % (ref 11.5–15.5)
WBC: 8.8 10*3/uL (ref 4.0–10.5)

## 2015-09-26 LAB — ETHANOL: Alcohol, Ethyl (B): 65 mg/dL — ABNORMAL HIGH (ref <5)

## 2015-09-26 MED ORDER — ONDANSETRON HCL 4 MG PO TABS: 4.0000 mg | ORAL_TABLET | Freq: Three times a day (TID) | ORAL | Status: DC | PRN

## 2015-09-26 MED ORDER — QUETIAPINE FUMARATE 100 MG PO TABS
200.0000 mg | ORAL_TABLET | Freq: Every day | ORAL | Status: DC
  Administered 2015-09-26: 200 mg via ORAL
  Filled 2015-09-26: qty 2

## 2015-09-26 MED ORDER — VITAMIN B-1 100 MG PO TABS
100.0000 mg | ORAL_TABLET | Freq: Every day | ORAL | Status: DC
  Administered 2015-09-26 – 2015-09-27 (×2): 100 mg via ORAL
  Filled 2015-09-26 (×2): qty 1

## 2015-09-26 MED ORDER — LORAZEPAM 1 MG PO TABS: 0.0000 mg | ORAL_TABLET | Freq: Four times a day (QID) | ORAL | Status: DC

## 2015-09-26 MED ORDER — TRAZODONE HCL 100 MG PO TABS
100.0000 mg | ORAL_TABLET | Freq: Every evening | ORAL | Status: DC | PRN
  Filled 2015-09-26: qty 1

## 2015-09-26 MED ORDER — QUETIAPINE FUMARATE 25 MG PO TABS
25.0000 mg | ORAL_TABLET | Freq: Two times a day (BID) | ORAL | Status: DC
  Administered 2015-09-26 – 2015-09-27 (×2): 25 mg via ORAL
  Filled 2015-09-26 (×2): qty 1

## 2015-09-26 MED ORDER — THIAMINE HCL 100 MG/ML IJ SOLN: 100.0000 mg | Freq: Every day | INTRAMUSCULAR | Status: DC

## 2015-09-26 MED ORDER — GABAPENTIN 300 MG PO CAPS
300.0000 mg | ORAL_CAPSULE | Freq: Three times a day (TID) | ORAL | Status: DC
  Administered 2015-09-26 – 2015-09-27 (×3): 300 mg via ORAL
  Filled 2015-09-26 (×3): qty 1

## 2015-09-26 MED ORDER — ZOLPIDEM TARTRATE 5 MG PO TABS: 5.0000 mg | ORAL_TABLET | Freq: Every evening | ORAL | Status: DC | PRN

## 2015-09-26 MED ORDER — DULOXETINE HCL 60 MG PO CPEP
60.0000 mg | ORAL_CAPSULE | Freq: Every day | ORAL | Status: DC
  Administered 2015-09-27: 60 mg via ORAL
  Filled 2015-09-26: qty 1

## 2015-09-26 MED ORDER — LORAZEPAM 1 MG PO TABS: 0.0000 mg | ORAL_TABLET | Freq: Two times a day (BID) | ORAL | Status: DC

## 2015-09-26 MED ORDER — IBUPROFEN 200 MG PO TABS: 600.0000 mg | ORAL_TABLET | Freq: Three times a day (TID) | ORAL | Status: DC | PRN

## 2015-09-26 MED ORDER — PHENYTOIN SODIUM EXTENDED 100 MG PO CAPS
200.0000 mg | ORAL_CAPSULE | Freq: Two times a day (BID) | ORAL | Status: DC
  Administered 2015-09-26 – 2015-09-27 (×2): 200 mg via ORAL
  Filled 2015-09-26 (×2): qty 2

## 2015-09-26 MED ORDER — HYDROXYZINE HCL 25 MG PO TABS
50.0000 mg | ORAL_TABLET | Freq: Three times a day (TID) | ORAL | Status: DC
  Administered 2015-09-26 – 2015-09-27 (×3): 50 mg via ORAL
  Filled 2015-09-26 (×3): qty 2
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
## 2015-09-26 NOTE — ED Provider Notes (Signed)
CSN: [id number]     Arrival date & time 09/26/15  1447 History   First MD Initiated Contact with Patient 09/26/15 1558     Chief Complaint  Patient presents with  . Suicidal  . IVC      ( The history is provided by the patient.  Patient was brought in under involuntary commitment by her boyfriend. Paperwork states that she has not been taking her medicine has been abusing alcohol and has been walking around with suicidal statements and carrying a night. Patient denies all this. States that she did drink some alcohol last night but does not think she did anything that was in the paperwork. Recently was inpatient behavioral health. States she did get somewhat depressed that the medicines she was also take regarding the cost almost $1000 for the month states she has not out of her medicines yet. States today is her last day. IVC paperwork states she has not been taking her medicine the patient states she's had them up until today. . Past Medical History  Diagnosis Date  . Bipolar 1 disorder (HCC)   . Depression   . Epilepsy [facility](HCC)    Past Surgical History  Procedure Laterality Date  . Pancreaticoduodenectomy    . Cholecystectomy    . Abdominal hysterectomy      partial   . Dental surgery     Family History  Problem Relation Age of Onset  . Cancer Mother   . Cancer Father    Social History  Substance Use Topics  . Smoking status: Current Some Day Smoker -- 1.00 packs/day for 15 years    Types: Cigarettes  . Smokeless tobacco: Never Used  . Alcohol Use: No   OB History    No data available     Review of Systems  Constitutional: Negative for activity change and appetite change.  Eyes: Negative for pain.  Respiratory: Negative for chest tightness and shortness of breath.   Cardiovascular: Negative for chest pain and leg swelling.  Gastrointestinal: Negative for nausea, vomiting, abdominal pain and diarrhea.  Genitourinary: Negative for flank pain.  Musculoskeletal: Negative  for back pain and neck stiffness.  Skin: Negative for rash.  Neurological: Negative for weakness, numbness and headaches.  Psychiatric/Behavioral: Negative for suicidal ideas and behavioral problems.      Allergies  Demerol and Morphine and related  Home Medications   Prior to Admission medications   Medication Sig Start Date End Date Taking? Authorizing Provider  aspirin EC 325 MG tablet Take 650 mg by mouth daily as needed for mild pain.   Yes Historical Provider, MD  DULoxetine (CYMBALTA) 60 MG capsule Take 1 capsule (60 mg total) by mouth daily. For depression 09/19/15  Yes Sanjuana KavaAgnes I Nwoko, NP  gabapentin (NEURONTIN) 300 MG capsule Take 1 capsule (300 mg total) by mouth 4 (four) times daily -  with meals and at bedtime. For agitation 09/19/15  Yes Sanjuana KavaAgnes I Nwoko, NP  hydrOXYzine (ATARAX/VISTARIL) 50 MG tablet Take 1 tablet (50 mg total) by mouth 3 (three) times daily. For anxiety 09/19/15  Yes Sanjuana KavaAgnes I Nwoko, NP  phenytoin (DILANTIN) 200 MG ER capsule Take 1 capsule (200 mg total) by mouth 2 (two) times daily. For seizure disorder 09/19/15  Yes Sanjuana KavaAgnes I Nwoko, NP  QUEtiapine (SEROQUEL) 200 MG tablet Take 1 tablet (200 mg total) by mouth at bedtime. For mood control 09/19/15  Yes Sanjuana KavaAgnes I Nwoko, NP  QUEtiapine (SEROQUEL) 25 MG tablet Take 1 tablet (25 mg total) by mouth  2 (two) times daily. For agitation 09/19/15  Yes Sanjuana Kava, NP  traZODone (DESYREL) 100 MG tablet Take 1 tablet (100 mg total) by mouth at bedtime as needed for sleep. 09/19/15  Yes Sanjuana Kava, NP   BP 117/76 mmHg  Pulse 65  Temp(Src) 98.9 F (37.2 C) (Oral)  Resp 14  Ht  (1.626 m)  Wt 135 lb (61.236 kg)  BMI 23.16 kg/m2  SpO2 95% Physical Exam  Constitutional: She appears well-developed.  HENT:  Head: Atraumatic.  Eyes: EOM are normal.  Neck: Neck supple.  Cardiovascular: Normal rate.   Pulmonary/Chest: Effort normal.  Abdominal: Soft. There is no tenderness.  Musculoskeletal: Normal range of motion.   Neurological: She is alert.  Skin: Skin is warm.  Psychiatric: She has a normal mood and affect.    ED Course  Procedures (including critical care time) Labs Review Labs Reviewed  COMPREHENSIVE METABOLIC PANEL - Abnormal; Notable for the following:    Calcium 8.5 (*)    All other components within normal limits  ETHANOL - Abnormal; Notable for the following:    Alcohol, Ethyl (B) 65 (*)    All other components within normal limits  CBC  URINE RAPID DRUG SCREEN, HOSP PERFORMED    Imaging Review No results found. I have personally reviewed and evaluated these images and lab results as part of my medical decision-making.   EKG Interpretation None      MDM   Final diagnoses:  PTSD (post-traumatic stress disorder)  MDD (major depressive disorder), recurrent severe, without psychosis (HCC)    Patient brought and reportedly suicidal and making statements and swinging a knife around. Patient denies it. States she did get drunk last night but denies the other things in the IVC paperwork. At this point appears to medically cleared. She is worried about cost of her medicine and was given coupons by case management. To be seen by TTS.    Benjiman Core, MD 09/26/15 [phone]

## 2015-09-26 NOTE — Progress Notes (Signed)
[facility].  Patient listed as having [employer] insurance.  EDCM spoke to patient at bedside.  Patient reports her insurance is from [place].  Patient also had Medicaid in [place].  She reports, "they cut off my insurance."  [facility] encouraged patient to apply for Medicaid at the DSS.  Patient reports she is familiar with the DSS as she has filed for disability.  EDCM provided patient with coupons from [employer] for the following medications:  Cybalta, neurontin, atarax, dilantin, seroquel 200mg , seroquel 25mg .  Trazadone 100mg  30 tablets is 4 dollars at [facility] no coupon necessary.  EDCM also provided patient with contact information for [facility] for [facility] and therapy as stated in her discharge summary from previous admission.  Patient very thankful services.  No further EDCm needs at this time.
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
## 2015-09-26 NOTE — Progress Notes (Signed)
Pt states she has last been seen by Katherine LodgeAnthony Steele Cm updated EPIC

## 2015-09-26 NOTE — ED Notes (Signed)
Pt AAO x 3, no distress noted, calm & cooperative, monitoring for safety, Q 15 min checks in effect. Remains SI & depressed.

## 2015-09-26 NOTE — ED Notes (Signed)
Pt oriented to room and unit.  Patient denies S/I and H/I.  Pt is pleasant and cooperative,.  15 minute checks and video monitoring in place.

## 2015-09-27 DIAGNOSIS — F1010 Alcohol abuse, uncomplicated: Secondary | ICD-10-CM | POA: Diagnosis not present

## 2015-09-27 DIAGNOSIS — F1094 Alcohol use, unspecified with alcohol-induced mood disorder: Secondary | ICD-10-CM

## 2015-09-27 NOTE — ED Notes (Addendum)
Pt discharged home per MD order. Discharge summary reviewed with pt. Pt verbalizes understanding. Pt denies SI/HI. Denies AVH at dischargel. Pt denies S/S of withdrawal. No s/s of withdrawal observed. Pt signed for personal property and property returned. Pt signed e-signature. [employer] in lobby for pt transportation home- per pt request. Pt reports she does not have a way home. Pt ambulatory off unit.

## 2015-09-27 NOTE — BH Assessment (Signed)
[facility] Assessment Progress Note  Per Mojeed Akintayo, MD, this pt does not require psychiatric hospitalization at this time.  Pt is to be discharged from [facility] with outpatient mental health and substance abuse treatment referrals.  Discharge instructions advise pt to follow up with [facility] and with [facility].  Pt is nurse, Ashley, has been notified.  Chayanne Filippi, MA Triage Specialist [phone]     

## 2015-09-27 NOTE — Consult Note (Signed)
[place] [facility] Consult   Reason for Consult:  IVC's by her boyfriend for alcohol abuse Referring Physician:  EDP Patient Identification: [place] MRN:  [id number] Principal Diagnosis: Alcohol-induced mood disorder [facility]) Diagnosis:   Patient Active Problem List   Diagnosis Date Noted  . Alcohol abuse [F10.10] 09/27/2015    Priority: High  . Alcohol-induced mood disorder ([place]) [F10.94] 09/27/2015    Priority: High  . PTSD (post-traumatic stress disorder) [F43.10] 09/16/2015  . Panic disorder [F41.0] 11/05/2014  . Convulsion ([place]) [R56.9]   . MDD (major depressive disorder), recurrent severe, without psychosis ([place]) [F33.2]     Total Time spent with patient: 45 minutes  Subjective:   Katherine [place] is a 40 y.o. female patient does not warrant admissin.  HPI:  40 yo female who was IVC'd by her boyfriend when he became "upset I drank, a domestic thing."  Today, she denies suicidal/homicidal ideations, hallucinations, and withdrawal symptoms.  She does report her boyfriend went to fill her medications and it was almost a thousand dollars which must not have been generic based on her medication list.  She was given information about [facility] for medication based on income.  Stable for discharge.  Past Psychiatric History: depression, alcohol abuse  Risk to Self: Suicidal Ideation: No Suicidal Intent: No Is patient at risk for suicide?: No Suicidal Plan?: No Specify Current Suicidal Plan:  (no plan ) Access to Means: Yes Specify Access to Suicidal Means:  (access to sharp household objects) How many times?:  (multiple attempts in the past) Other Self Harm Risks:  (history of cutting) Triggers for Past Attempts: Unpredictable Intentional Self Injurious Behavior: Cutting (history of cutting) Risk to Others: Homicidal Ideation: No Thoughts of Harm to Others: No Current Homicidal Intent: No Current Homicidal Plan: No Access to Homicidal Means: No Identified Victim:   (n/a) History of harm to others?: No Assessment of Violence: None Noted Violent Behavior Description:  ("I don't do that", "My boyfriend is lying on me"') Does patient have access to weapons?: No Criminal Charges Pending?: Yes Describe Pending Criminal Charges:  Bonnita Nasuti) Does patient have a court date: No Prior Inpatient Therapy: Prior Inpatient Therapy: Yes Prior Therapy Dates: 10/2014; 11/2010 (10/2014,11/2010; discharged from [facility] 09/19/2015) Prior Therapy Facilty/Provider(s): [facility] Reason for Treatment: SI Prior Outpatient Therapy: Prior Outpatient Therapy: Yes Prior Therapy Dates: For three months, ending 03/17 Prior Therapy Facilty/Provider(s): Dr. Elbert Ewings Reason for Treatment: Med management Does patient have an ACCT team?: No Does patient have Intensive In-House Services?  : No Does patient have [facility] services? : No Does patient have P4CC services?: No  Past Medical History:  Past Medical History  Diagnosis Date  . Bipolar 1 disorder ([place])   . Depression   . Epilepsy [facility])     Past Surgical History  Procedure Laterality Date  . Pancreaticoduodenectomy    . Cholecystectomy    . Abdominal hysterectomy      partial   . Dental surgery     Family History:  Family History  Problem Relation Age of Onset  . Cancer Mother   . Cancer Father    Family Psychiatric  History: none Social History:  History  Alcohol Use No     History  Drug Use No    Social History   Social History  . Marital Status: Divorced    Spouse Name: N/A  . Number of Children: N/A  . Years of Education: N/A   Social History Main Topics  . Smoking status: Current Some Day  Smoker -- 1.00 packs/day for 15 years    Types: Cigarettes  . Smokeless tobacco: Never Used  . Alcohol Use: No  . Drug Use: No  . Sexual Activity: Yes   Other Topics Concern  . None   Social History Narrative   Additional Social History:    Allergies:   Allergies  Allergen Reactions  . Demerol  Anaphylaxis and Rash  . Morphine And Related Anaphylaxis and Rash    Labs:  Results for orders placed or performed during the hospital encounter of 09/26/15 (from the past 48 hour(s))  Comprehensive metabolic panel     Status: Abnormal   Collection Time: 09/26/15  4:20 PM  Result Value Ref Range   Sodium 142 135 - 145 mmol/L   Potassium 4.0 3.5 - 5.1 mmol/L   Chloride 107 101 - 111 mmol/L   CO2 26 22 - 32 mmol/L   Glucose, Bld 82 65 - 99 mg/dL   BUN 15 6 - 20 mg/dL   Creatinine, Ser 0.64 0.44 - 1.00 mg/dL   Calcium 8.5 (L) 8.9 - 10.3 mg/dL   Total Protein 6.8 6.5 - 8.1 g/dL   Albumin 4.6 3.5 - 5.0 g/dL   AST 25 15 - 41 U/L   ALT 30 14 - 54 U/L   Alkaline Phosphatase 58 38 - 126 U/L   Total Bilirubin 0.6 0.3 - 1.2 mg/dL   GFR calc non Af Amer >60 >60 mL/min   GFR calc Af Amer >60 >60 mL/min    Comment: (NOTE) The eGFR has been calculated using the CKD EPI equation. This calculation has not been validated in all clinical situations. eGFR's persistently <60 mL/min signify possible Chronic Kidney Disease.    Anion gap 9 5 - 15  Ethanol     Status: Abnormal   Collection Time: 09/26/15  4:20 PM  Result Value Ref Range   Alcohol, Ethyl (B) 65 (H) <5 mg/dL    Comment:        LOWEST DETECTABLE LIMIT FOR SERUM ALCOHOL IS 5 mg/dL FOR MEDICAL PURPOSES ONLY   cbc     Status: None   Collection Time: 09/26/15  4:20 PM  Result Value Ref Range   WBC 8.8 4.0 - 10.5 K/uL   RBC 4.85 3.87 - 5.11 MIL/uL   Hemoglobin 14.6 12.0 - 15.0 g/dL   HCT 42.7 36.0 - 46.0 %   MCV 88.0 78.0 - 100.0 fL   MCH 30.1 26.0 - 34.0 pg   MCHC 34.2 30.0 - 36.0 g/dL   RDW 14.0 11.5 - 15.5 %   Platelets 230 150 - 400 K/uL  Rapid urine drug screen (hospital performed)     Status: None   Collection Time: 09/26/15  5:11 PM  Result Value Ref Range   Opiates NONE DETECTED NONE DETECTED   Cocaine NONE DETECTED NONE DETECTED   Benzodiazepines NONE DETECTED NONE DETECTED   Amphetamines NONE DETECTED NONE  DETECTED   Tetrahydrocannabinol NONE DETECTED NONE DETECTED   Barbiturates NONE DETECTED NONE DETECTED    Comment:        DRUG SCREEN FOR MEDICAL PURPOSES ONLY.  IF CONFIRMATION IS NEEDED FOR ANY PURPOSE, NOTIFY LAB WITHIN 5 DAYS.        LOWEST DETECTABLE LIMITS FOR URINE DRUG SCREEN Drug Class       Cutoff (ng/mL) Amphetamine      1000 Barbiturate      200 Benzodiazepine   297 Tricyclics       989 Opiates  300 Cocaine          300 THC              50     Current Facility-Administered Medications  Medication Dose Route Frequency Provider Last Rate Last Dose  . DULoxetine (CYMBALTA) DR capsule 60 mg  60 mg Oral Daily Davonna Belling, MD   60 mg at 09/27/15 0904  . gabapentin (NEURONTIN) capsule 300 mg  300 mg Oral TID WC & HS Davonna Belling, MD   300 mg at 09/27/15 0901  . hydrOXYzine (ATARAX/VISTARIL) tablet 50 mg  50 mg Oral TID Davonna Belling, MD   50 mg at 09/27/15 0900  . ibuprofen (ADVIL,MOTRIN) tablet 600 mg  600 mg Oral Q8H PRN Davonna Belling, MD      . LORazepam (ATIVAN) tablet 0-4 mg  0-4 mg Oral Q6H Davonna Belling, MD   0 mg at 09/26/15 1829   Followed by  . [START ON 09/28/2015] LORazepam (ATIVAN) tablet 0-4 mg  0-4 mg Oral Q12H Davonna Belling, MD      . ondansetron [facility]) tablet 4 mg  4 mg Oral Q8H PRN Davonna Belling, MD      . phenytoin (DILANTIN) ER capsule 200 mg  200 mg Oral BID Davonna Belling, MD   200 mg at 09/27/15 0901  . QUEtiapine (SEROQUEL) tablet 200 mg  200 mg Oral QHS Davonna Belling, MD   200 mg at 09/26/15 2135  . QUEtiapine (SEROQUEL) tablet 25 mg  25 mg Oral BID Davonna Belling, MD   25 mg at 09/27/15 0901  . thiamine (VITAMIN B-1) tablet 100 mg  100 mg Oral Daily Davonna Belling, MD   100 mg at 09/27/15 0901   Or  . thiamine (B-1) injection 100 mg  100 mg Intravenous Daily Davonna Belling, MD      . traZODone (DESYREL) tablet 100 mg  100 mg Oral QHS PRN Davonna Belling, MD      . zolpidem (AMBIEN) tablet 5 mg  5 mg  Oral QHS PRN Davonna Belling, MD       Current Outpatient Prescriptions  Medication Sig Dispense Refill  . aspirin EC 325 MG tablet Take 650 mg by mouth daily as needed for mild pain.    . DULoxetine (CYMBALTA) 60 MG capsule Take 1 capsule (60 mg total) by mouth daily. For depression 30 capsule 0  . gabapentin (NEURONTIN) 300 MG capsule Take 1 capsule (300 mg total) by mouth 4 (four) times daily -  with meals and at bedtime. For agitation 120 capsule 0  . hydrOXYzine (ATARAX/VISTARIL) 50 MG tablet Take 1 tablet (50 mg total) by mouth 3 (three) times daily. For anxiety 60 tablet 0  . phenytoin (DILANTIN) 200 MG ER capsule Take 1 capsule (200 mg total) by mouth 2 (two) times daily. For seizure disorder 1 capsule 0  . QUEtiapine (SEROQUEL) 200 MG tablet Take 1 tablet (200 mg total) by mouth at bedtime. For mood control 30 tablet 0  . QUEtiapine (SEROQUEL) 25 MG tablet Take 1 tablet (25 mg total) by mouth 2 (two) times daily. For agitation 60 tablet 0  . traZODone (DESYREL) 100 MG tablet Take 1 tablet (100 mg total) by mouth at bedtime as needed for sleep. 30 tablet 0    Musculoskeletal: Strength & Muscle Tone: within normal limits Gait & Station: normal Patient leans: N/A  Psychiatric Specialty Exam: Physical Exam  Constitutional: She is oriented to person, place, and time. She appears well-developed and well-nourished.  HENT:  Head: Normocephalic.  Neck: Normal range of motion.  Respiratory: Effort normal.  Musculoskeletal: Normal range of motion.  Neurological: She is alert and oriented to person, place, and time.  Skin: Skin is warm and dry.  Psychiatric: Her speech is normal and behavior is normal. Judgment and thought content normal. Her mood appears anxious. Cognition and memory are normal.    Review of Systems  Constitutional: Negative.   HENT: Negative.   Eyes: Negative.   Respiratory: Negative.   Cardiovascular: Negative.   Gastrointestinal: Negative.   Genitourinary:  Negative.   Musculoskeletal: Negative.   Skin: Negative.   Neurological: Negative.   Endo/Heme/Allergies: Negative.   Psychiatric/Behavioral: Positive for substance abuse. The patient is nervous/anxious.     Blood pressure 120/75, pulse 60, temperature 98.6 F (37 C), temperature source Oral, resp. rate 18, height '5\' 4"'$  (1.626 m), weight 61.236 kg (135 lb), SpO2 100 %.Body mass index is 23.16 kg/(m^2).  General Appearance: Casual  Eye Contact:  Good  Speech:  Normal Rate  Volume:  Normal  Mood:  Anxious  Affect:  Congruent  Thought Process:  Coherent and Descriptions of Associations: Intact  Orientation:  Full (Time, Place, and Person)  Thought Content:  WDL  Suicidal Thoughts:  No  Homicidal Thoughts:  No  Memory:  Immediate;   Good Recent;   Good Remote;   Good  Judgement:  Fair  Insight:  Fair  Psychomotor Activity:  Normal  Concentration:  Concentration: Good and Attention Span: Good  Recall:  Good  Fund of Knowledge:  Fair  Language:  Good  Akathisia:  No  Handed:  Right  AIMS (if indicated):     Assets:  Housing Intimacy Leisure Time Physical Health Resilience Social Support  ADL's:  Intact  Cognition:  WNL  Sleep:        Treatment Plan Summary: Daily contact with patient to assess and evaluate symptoms and progress in treatment, Medication management and Plan alcohol induced mood disorder:  -Crisis stabilization -Medication management:  Continue her medical medications along with her Cymbalta 60 mg daily for depression, Gabapentin 300 mg QID for mood stabilization, Vistaril 50 mg TID for anxiety, Seroquel 200 mg at bedtime for sleep and mood with Seroquel 25 mg BID, and Trazodone 100 mg at bedtime for sleep.  Started Ativan alcohol detox protocol and Ambien 5 mg at bedtime for sleep PRN -Individual and substance abuse counseling  Disposition: No evidence of imminent risk to self or others at present.    Waylan Boga, NP 09/27/2015 10:55 AM Patient seen  face-to-face for psychiatric evaluation, chart reviewed and case discussed with the physician extender and developed treatment plan. Reviewed the information documented and agree with the treatment plan. Corena Pilgrim, MD

## 2015-09-27 NOTE — Discharge Instructions (Signed)
For your ongoing mental health needs, you are advised to follow up with [facility].  New and returning patients are seen at their walk-in clinic.  Walk-in hours are Monday - Friday from 8:00 am - 3:00 pm.  Walk-in patients are seen on a first come, first served basis.  Try to arrive as early as possible for he best chance of being seen the same day: ° °     [facility] °     [address] °     [place], [place] [address] °     [phone] ° °To help you maintain a sober lifestyle, a substance abuse treatment program may be beneficial to you.  Contact [facility] at your earliest opportunity to ask about enrolling in their program: ° °     [facility] °     [address] °     [place], [place] [address] °     [phone] °     New patients are seen at the walk-in clinic every Tuesday from 9:00 am - 12:00 pm. °

## 2015-09-27 NOTE — [facility] Suicide Risk Assessment (Signed)
Suicide Risk Assessment  Discharge Assessment   [facility]   Principal Problem: Alcohol-induced mood disorder [facility](HCC) Discharge Diagnoses:  Patient Active Problem List   Diagnosis Date Noted  . Alcohol abuse [F10.10] 09/27/2015    Priority: High  . Alcohol-induced mood disorder ([facility]) [F10.94] 09/27/2015    Priority: High  . PTSD (post-traumatic stress disorder) [F43.10] 09/16/2015  . Panic disorder [F41.0] 11/05/2014  . Convulsion ([facility]) [R56.9]   . MDD (major depressive disorder), recurrent severe, without psychosis ([facility]) [F33.2]     Total Time spent with patient: 45 minutes  Musculoskeletal: Strength & Muscle Tone: within normal limits Gait & Station: normal Patient leans: N/A  Psychiatric Specialty Exam: Physical Exam  Constitutional: She is oriented to person, place, and time. She appears well-developed and well-nourished.  HENT:  Head: Normocephalic.  Neck: Normal range of motion.  Respiratory: Effort normal.  Musculoskeletal: Normal range of motion.  Neurological: She is alert and oriented to person, place, and time.  Skin: Skin is warm and dry.  Psychiatric: Her speech is normal and behavior is normal. Judgment and thought content normal. Her mood appears anxious. Cognition and memory are normal.    Review of Systems  Constitutional: Negative.   HENT: Negative.   Eyes: Negative.   Respiratory: Negative.   Cardiovascular: Negative.   Gastrointestinal: Negative.   Genitourinary: Negative.   Musculoskeletal: Negative.   Skin: Negative.   Neurological: Negative.   Endo/Heme/Allergies: Negative.   Psychiatric/Behavioral: Positive for substance abuse. The patient is nervous/anxious.     Blood pressure 120/75, pulse 60, temperature 98.6 F (37 C), temperature source Oral, resp. rate 18, height 5\' 4"  (1.626 m), weight 61.236 kg (135 lb), SpO2 100 %.Body mass index is 23.16 kg/(m^2).  General Appearance: Casual  Eye Contact:  Good  Speech:   Normal Rate  Volume:  Normal  Mood:  Anxious  Affect:  Congruent  Thought Process:  Coherent and Descriptions of Associations: Intact  Orientation:  Full (Time, Place, and Person)  Thought Content:  WDL  Suicidal Thoughts:  No  Homicidal Thoughts:  No  Memory:  Immediate;   Good Recent;   Good Remote;   Good  Judgement:  Fair  Insight:  Fair  Psychomotor Activity:  Normal  Concentration:  Concentration: Good and Attention Span: Good  Recall:  Good  Fund of Knowledge:  Fair  Language:  Good  Akathisia:  No  Handed:  Right  AIMS (if indicated):     Assets:  Housing Intimacy Leisure Time Physical Health Resilience Social Support  ADL's:  Intact  Cognition:  WNL  Sleep:       Mental Status Per Nursing Assessment::   On Admission:   alcohol abuse, IVC'd by her boyfriend  Demographic Factors:  Caucasian  Loss Factors: NA  Historical Factors: NA  Risk Reduction Factors:   Sense of responsibility to family, Living with another person, especially a relative and Positive social support  Continued Clinical Symptoms:  Anxiety, mild  Cognitive Features That Contribute To Risk:  None    Suicide Risk:  Minimal: No identifiable suicidal ideation.  Patients presenting with no risk factors but with morbid ruminations; may be classified as minimal risk based on the severity of the depressive symptoms    Plan Of Care/Follow-up recommendations:  Activity:  as tolerated  Diet:  heart healthy diet  Muath Hallam, NP 09/27/2015, 11:09 AM

## 2015-11-05 ENCOUNTER — Encounter (HOSPITAL_COMMUNITY): Payer: Self-pay

## 2015-11-05 ENCOUNTER — Emergency Department (HOSPITAL_COMMUNITY)
Admission: EM | Admit: 2015-11-05 | Discharge: 2015-11-05 | Disposition: A | Discharge status: Home / Self Care | Payer: 59 | Attending: Emergency Medicine | Admitting: Emergency Medicine

## 2015-11-05 DIAGNOSIS — F17210 Nicotine dependence, cigarettes, uncomplicated: Secondary | ICD-10-CM | POA: Insufficient documentation

## 2015-11-05 DIAGNOSIS — F419 Anxiety disorder, unspecified: Secondary | ICD-10-CM | POA: Insufficient documentation

## 2015-11-05 DIAGNOSIS — Z7982 Long term (current) use of aspirin: Secondary | ICD-10-CM | POA: Insufficient documentation

## 2015-11-05 DIAGNOSIS — R21 Rash and other nonspecific skin eruption: Secondary | ICD-10-CM | POA: Insufficient documentation

## 2015-11-05 DIAGNOSIS — Z79899 Other long term (current) drug therapy: Secondary | ICD-10-CM | POA: Insufficient documentation

## 2015-11-05 LAB — COMPREHENSIVE METABOLIC PANEL
ALBUMIN: 4.4 g/dL (ref 3.5–5.0)
ALT: 25 U/L (ref 14–54)
AST: 18 U/L (ref 15–41)
Alkaline Phosphatase: 67 U/L (ref 38–126)
Anion gap: 7 (ref 5–15)
BUN: 11 mg/dL (ref 6–20)
CHLORIDE: 106 mmol/L (ref 101–111)
CO2: 25 mmol/L (ref 22–32)
CREATININE: 0.6 mg/dL (ref 0.44–1.00)
Calcium: 9.1 mg/dL (ref 8.9–10.3)
GFR calc Af Amer: >60 mL/min (ref >60)
GFR calc non Af Amer: >60 mL/min (ref >60)
GLUCOSE: 145 mg/dL — AB (ref 65–99)
POTASSIUM: 3.5 mmol/L (ref 3.5–5.1)
SODIUM: 138 mmol/L (ref 135–145)
Total Bilirubin: 0.6 mg/dL (ref 0.3–1.2)
Total Protein: 6.5 g/dL (ref 6.5–8.1)

## 2015-11-05 LAB — CBC WITH DIFFERENTIAL/PLATELET
BASOS ABS: 0 10*3/uL (ref 0.0–0.1)
BASOS PCT: 0 %
EOS ABS: 0.1 10*3/uL (ref 0.0–0.7)
EOS PCT: 1 %
HCT: 41.8 % (ref 36.0–46.0)
Hemoglobin: 13.8 g/dL (ref 12.0–15.0)
LYMPHS PCT: 10 %
Lymphs Abs: 0.8 10*3/uL (ref 0.7–4.0)
MCH: 30.1 pg (ref 26.0–34.0)
MCHC: 33 g/dL (ref 30.0–36.0)
MCV: 91.1 fL (ref 78.0–100.0)
MONO ABS: 0.1 10*3/uL (ref 0.1–1.0)
Monocytes Relative: 2 %
Neutro Abs: 6.7 10*3/uL (ref 1.7–7.7)
Neutrophils Relative %: 87 %
PLATELETS: 213 10*3/uL (ref 150–400)
RBC: 4.59 MIL/uL (ref 3.87–5.11)
RDW: 14.7 % (ref 11.5–15.5)
WBC: 7.7 10*3/uL (ref 4.0–10.5)

## 2015-11-05 LAB — LIPASE, BLOOD: LIPASE: 11 U/L (ref 11–51)

## 2015-11-05 MED ORDER — SODIUM CHLORIDE 0.9 % IV BOLUS (SEPSIS)
1000.0000 mL | Freq: Once | INTRAVENOUS | Status: AC
  Administered 2015-11-05: 1000 mL via INTRAVENOUS

## 2015-11-05 MED ORDER — DOXYCYCLINE HYCLATE 100 MG PO TABS
100.0000 mg | ORAL_TABLET | Freq: Once | ORAL | Status: AC
  Administered 2015-11-05: 100 mg via ORAL
  Filled 2015-11-05: qty 1

## 2015-11-05 MED ORDER — FLUCONAZOLE 100 MG PO TABS
150.0000 mg | ORAL_TABLET | Freq: Once | ORAL | Status: AC
  Administered 2015-11-05: 150 mg via ORAL
  Filled 2015-11-05: qty 2

## 2015-11-05 MED ORDER — KETOROLAC TROMETHAMINE 30 MG/ML IJ SOLN
30.0000 mg | Freq: Once | INTRAMUSCULAR | Status: AC
  Administered 2015-11-05: 30 mg via INTRAVENOUS
  Filled 2015-11-05: qty 1

## 2015-11-05 MED ORDER — FLUCONAZOLE 50 MG PO TABS: 50.0000 mg | ORAL_TABLET | Freq: Every day | ORAL | 0 refills | Status: AC

## 2015-11-05 MED ORDER — DIPHENHYDRAMINE HCL 50 MG/ML IJ SOLN
50.0000 mg | Freq: Once | INTRAMUSCULAR | Status: AC
  Administered 2015-11-05: 50 mg via INTRAVENOUS
  Filled 2015-11-05: qty 1

## 2015-11-05 MED ORDER — LORAZEPAM 0.5 MG PO TABS: 0.5000 mg | ORAL_TABLET | Freq: Three times a day (TID) | ORAL | 0 refills | Status: DC | PRN

## 2015-11-05 MED ORDER — LORAZEPAM 2 MG/ML IJ SOLN
1.0000 mg | Freq: Once | INTRAMUSCULAR | Status: AC
  Administered 2015-11-05: 1 mg via INTRAVENOUS
  Filled 2015-11-05: qty 1

## 2015-11-05 MED ORDER — METHYLPREDNISOLONE SODIUM SUCC 125 MG IJ SOLR
125.0000 mg | Freq: Once | INTRAMUSCULAR | Status: AC
  Administered 2015-11-05: 125 mg via INTRAVENOUS
  Filled 2015-11-05: qty 2

## 2015-11-05 MED ORDER — DOXYCYCLINE HYCLATE 100 MG PO CAPS: 100.0000 mg | ORAL_CAPSULE | Freq: Two times a day (BID) | ORAL | 0 refills | Status: DC

## 2015-11-05 MED ORDER — FAMOTIDINE IN NACL 20-0.9 MG/50ML-% IV SOLN
20.0000 mg | Freq: Once | INTRAVENOUS | Status: AC
  Administered 2015-11-05: 20 mg via INTRAVENOUS
  Filled 2015-11-05: qty 50

## 2015-11-05 MED ORDER — HYDROXYZINE HCL 25 MG PO TABS: 25.0000 mg | ORAL_TABLET | Freq: Four times a day (QID) | ORAL | 1 refills | Status: DC | PRN

## 2015-11-05 NOTE — ED Notes (Signed)
Declined W/C at D/C and was escorted to lobby by RN. 

## 2015-11-05 NOTE — Discharge Instructions (Addendum)
Continue to finish your steroid taper, because abrupt discontinuation of steroids can cause bad side effects and may cause rash to be suddenly worse. Take Atarax (hydroxyzine) for itching. Take doxycycline and diflucan as prescribed, this will treat possible bacterial and fungal sources of the rash.  Please follow up with your PCP for recheck.  Your rash may need further testing that we do not have available in the ER, such as biopsy or scrapings.  There are also various diseases that take time and outpatient testing to diagnose, such as thyroid disease or celiac disease that can have similar appearing rash as a vague symptom.  It may be a process to heal/definitively diagnose rash.  The majority of rashes are not diagnosed, but resolve.

## 2015-11-05 NOTE — ED Provider Notes (Signed)
[facility]-EMERGENCY DEPT Provider Note   CSN: [id number] Arrival date & time: 11/05/15  1137   By signing my name below, I, Christy SartoriusAnastasia Kolousek, attest that this documentation has been prepared under the direction and in the presence of  Danelle BerryLeisa Tashema Tiller, PA-C. Electronically Signed: Christy SartoriusAnastasia Kolousek, ED Scribe. 11/05/15. 2:18 PM.   History   Chief Complaint Chief Complaint  Patient presents with  . Rash   The history is provided by the patient. No language interpreter was used.     HPI Comments:  Katherine Bond is a 40 y.o. female who presents to the Emergency Department complaining of a rapidly worsening, painful and pruritic, burning generalized rash that began a week ago.  She states that it began as a few bumps on her left arm and spread over her entire body.  She states that she feels like her "blood is boiling."  She also notes associated headache, generalized myalgias and malaise, sweats and chills throughout the day, and states her anxiety has been very bad.  She was doing yard work in [place] the day before it appeared an originally thought it was poison ivy. She immediately went inside and showered off and applied calamine lotion but it continued to rapidly worsened. She was evaluated at urgent care and was given a long steroid taper and has been taking prednisone and around-the-clock Benadryl without relief.  She has also been taking 25 mg of benadryl without relief.  She denies any known bug bite or tick bites. 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  She has been sleeping with her boyfriend since returning home locally, but he has not had any similar symptoms.    She also has a h/o of pancreatitis and has had her gallbladder removed.  She stopped taking her antidepressant 2 months ago because she could not afford it and she denies taking any other medications daily. No known changes to soaps or detergents.  She denies any other medical  history. She denies any IV drug use, STD exposure, fever, neck pain or stiffness. She does not feel any scratches in her throat, sensation of throat closure, shortness of breath or wheeze, swelling of lips or face, chest pain, abdominal pain, nausea, vomiting. She adds that she feels she's been urinating frequently. She had sex with her boyfriend yesterday and states "after having sex [the rash] moved inside her."  She has no vaginal complaints, no other acute or associated symptoms.  Past Medical History:  Diagnosis Date  . Bipolar 1 disorder (HCC)   . Depression   . Epilepsy [facility](HCC)     Patient Active Problem List   Diagnosis Date Noted  . Alcohol abuse 09/27/2015  . 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 PTSD (post-traumatic stress disorder) 09/16/2015  . Panic disorder 11/05/2014  . Convulsion (HCC)   . MDD (major depressive disorder), recurrent severe, without psychosis (HCC)     Past Surgical History:  Procedure Laterality Date  . ABDOMINAL HYSTERECTOMY     partial   . CHOLECYSTECTOMY    . DENTAL SURGERY    . PANCREATICODUODENECTOMY      OB History    No data available       Home Medications    Prior to Admission medications   Medication Sig Start Date End Date Taking? Authorizing Provider  aspirin EC 325 MG tablet Take 650 mg by mouth daily as needed for mild pain.    Historical Provider, MD  doxycycline (VIBRAMYCIN) 100 MG capsule Take 1 capsule (100 mg total) by mouth 2 (two) times daily. 11/05/15   Danelle Berry, PA-C  DULoxetine (CYMBALTA) 60 MG capsule Take 1 capsule (60 mg total) by mouth daily. For depression 09/19/15   Sanjuana Kava, NP  fluconazole (DIFLUCAN) 50 MG tablet Take 1 tablet (50 mg total) by mouth daily. 11/05/15 11/12/15  Danelle Berry, PA-C  gabapentin (NEURONTIN) 300 MG capsule Take 1 capsule (300 mg total) by mouth 4 (four) times daily -  with meals and at bedtime. For agitation 09/19/15   Sanjuana Kava, NP  hydrOXYzine (ATARAX/VISTARIL) 25 MG  tablet Take 1 tablet (25 mg total) by mouth every 6 (six) hours as needed for anxiety or itching. 11/05/15   Danelle Berry, PA-C  LORazepam (ATIVAN) 0.5 MG tablet Take 1 tablet (0.5 mg total) by mouth every 8 (eight) hours as needed for anxiety. 11/05/15   Danelle Berry, PA-C  phenytoin (DILANTIN) 200 MG ER capsule Take 1 capsule (200 mg total) by mouth 2 (two) times daily. For seizure disorder 09/19/15   Sanjuana Kava, NP  QUEtiapine (SEROQUEL) 200 MG tablet Take 1 tablet (200 mg total) by mouth at bedtime. For mood control 09/19/15   Sanjuana Kava, NP  QUEtiapine (SEROQUEL) 25 MG tablet Take 1 tablet (25 mg total) by mouth 2 (two) times daily. For agitation 09/19/15   Sanjuana Kava, NP  traZODone (DESYREL) 100 MG tablet Take 1 tablet (100 mg total) by mouth at bedtime as needed for sleep. 09/19/15   Sanjuana Kava, NP    Family History Family History  Problem Relation Age of Onset  . Cancer Mother   . Cancer Father     Social History Social History  Substance Use Topics  . Smoking status: Current Some Day Smoker    Packs/day: 1.00    Years: 15.00    Types: Cigarettes  . Smokeless tobacco: Never Used  . Alcohol use No     Allergies   Demerol and Morphine and related   Review of Systems Review of Systems  Constitutional: Negative for activity change and appetite change.  HENT: Negative for sore throat.   Respiratory: Negative.   Cardiovascular: Negative.   Gastrointestinal: Negative.   Musculoskeletal: Negative for arthralgias, joint swelling, neck pain and neck stiffness.  Skin: Positive for rash.  Allergic/Immunologic: Negative for environmental allergies, food allergies and immunocompromised state.  Neurological: Positive for headaches. Negative for dizziness, tremors, seizures, syncope, facial asymmetry, weakness, light-headedness and numbness.  Hematological: Negative for adenopathy. Does not bruise/bleed easily.  Psychiatric/Behavioral: Positive for sleep disturbance (secondary to  itching). Negative for self-injury and suicidal ideas. The patient is nervous/anxious.   All other systems reviewed and are negative.    Physical Exam Updated Vital Signs BP 136/87 (BP Location: Left Arm)   Pulse 65   Temp 98.2 F (36.8 C) (Oral)   Resp 18   Ht 5\' 4"  (1.626 m)   Wt 61.2 kg   SpO2 97%   BMI 23.17 kg/m   Physical Exam  Constitutional: She is oriented to person, place, and time. She appears well-developed and well-nourished.  Non-toxic appearance. She does not have a sickly appearance. She does not appear ill.  HENT:  Head: Normocephalic and atraumatic.  Nose: Nose normal.  Mouth/Throat: Oropharynx is clear and moist. No oropharyngeal exudate.  Eyes: Conjunctivae and EOM are normal. Pupils are equal, round, and reactive to light. Right eye exhibits no discharge. Left eye exhibits no discharge.  No scleral icterus.  Neck: Normal range of motion. Neck supple.  Cardiovascular: Normal rate, regular rhythm, normal heart sounds and intact distal pulses.  Exam reveals no gallop and no friction rub.   No murmur heard. Pulmonary/Chest: Effort normal and breath sounds normal. No accessory muscle usage or stridor. No respiratory distress. She has no wheezes. She has no rhonchi. She has no rales. She exhibits no tenderness.  Abdominal: Soft. Bowel sounds are normal. She exhibits no distension and no mass. There is no tenderness. There is no rebound and no guarding.  Musculoskeletal: Normal range of motion. She exhibits no edema or tenderness.  Lymphadenopathy:    She has no cervical adenopathy.  Neurological: She is alert and oriented to person, place, and time. She exhibits normal muscle tone. Coordination normal.  Speech clear without dysarthria.  Skin: Skin is warm and dry. Capillary refill takes less than 2 seconds. Rash noted. She is not diaphoretic. There is erythema.  Widespread erythematous rash of multiple morphologies - see pictures and descriptions  Psychiatric: Her  behavior is normal. Thought content normal.  Anxious, rapid speech  Nursing note and vitals reviewed.    Small erythematous papules over bilateral hands and wrist, with excoriations   Maculopapular erythematous rash with some erythematous patches with excoriations to bilateral AC's  Blanching violaceous/erythematous confluent macule to abdomen, groin and inner thigh  Excoriated bilateral LE    ED Treatments / Results   DIAGNOSTIC STUDIES:  Oxygen Saturation is 96% on RA, nml by my interpretation.    COORDINATION OF CARE:  2:18 PM Discussed treatment plan with pt at bedside and pt agreed to plan.  Labs (all labs ordered are listed, but only abnormal results are displayed) Labs Reviewed  COMPREHENSIVE METABOLIC PANEL - Abnormal; Notable for the following:       Result Value   Glucose, Bld 145 (*)    All other components within normal limits  CBC WITH DIFFERENTIAL/PLATELET  LIPASE, BLOOD  B. BURGDORFI ANTIBODIES  ROCKY MTN SPOTTED FVR ABS PNL(IGG+IGM)    EKG  EKG Interpretation None       Radiology No results found.  Procedures Procedures (including critical care time)  Medications Ordered in ED Medications  sodium chloride 0.9 % bolus 1,000 mL (0 mLs Intravenous Stopped 11/05/15 1812)  diphenhydrAMINE (BENADRYL) injection 50 mg (50 mg Intravenous Given 11/05/15 1443)  famotidine (PEPCID) IVPB 20 mg premix (0 mg Intravenous Stopped 11/05/15 1503)  methylPREDNISolone sodium succinate (SOLU-MEDROL) 125 mg/2 mL injection 125 mg (125 mg Intravenous Given 11/05/15 1442)  LORazepam (ATIVAN) injection 1 mg (1 mg Intravenous Given 11/05/15 1442)  ketorolac (TORADOL) 30 MG/ML injection 30 mg (30 mg Intravenous Given 11/05/15 1443)  doxycycline (VIBRA-TABS) tablet 100 mg (100 mg Oral Given 11/05/15 1832)  fluconazole (DIFLUCAN) tablet 150 mg (150 mg Oral Given 11/05/15 1832)     Initial Impression / Assessment and Plan / ED Course  I have reviewed the triage vital signs  and the nursing notes.  Pertinent labs & imaging results that were available during my care of the patient were reviewed by me and considered in my medical decision making (see chart for details).  Clinical Course   Pt with one week of generalized red pruritic rash, multiple morphologies, with "burning" quality.  Pt did not improve with steroid taper initiated 4 days ago, no improvement with 50 mg benadryl Q6H.  Will obtain basic labs, tick-born illness testing, give fluids and IV meds.  Pt has extensive psych history, currently not on  meds, no recent med changes or d/c's.  She is extremely anxious, responded well to IV meds and ativan.  Labs unremarkable.  Pending RMSF and Lyme.  SHe was recently in [place], discussed case with attending EDP, who agrees to cover with doxy while waiting for labs.  Pt covered with diflucan and doxy, discussed taper of prednisone to avoid rebound.  She was given few day supply of ativan, with doxy, diflucan at lower dose for 7 days, and encouraged to keep using benadryl and pepcid.  REturn precautions reviewed.  Pt was discharged home in good condition with VSS.   Final Clinical Impressions(s) / ED Diagnoses   Final diagnoses:  Rash and nonspecific skin eruption  Anxiety    New Prescriptions Discharge Medication List as of 11/05/2015  6:10 PM    START taking these medications   Details  doxycycline (VIBRAMYCIN) 100 MG capsule Take 1 capsule (100 mg total) by mouth 2 (two) times daily., Starting Sun 11/05/2015, Print    fluconazole (DIFLUCAN) 50 MG tablet Take 1 tablet (50 mg total) by mouth daily., Starting Sun 11/05/2015, Until Sun 11/12/2015, Print    LORazepam (ATIVAN) 0.5 MG tablet Take 1 tablet (0.5 mg total) by mouth every 8 (eight) hours as needed for anxiety., Starting Sun 11/05/2015, Print       I personally performed the services described in this documentation, which was scribed in my presence. The recorded information has been reviewed and is  accurate.       Danelle Berry, PA-C 11/07/15 1851    Alvira Monday, MD 11/09/15 1354

## 2015-11-05 NOTE — ED Triage Notes (Signed)
Patient here with itching, burning rash x 5 days. Taking steroids as prescribed and request further evaluation.

## 2015-11-07 LAB — B. BURGDORFI ANTIBODIES

## 2015-11-09 ENCOUNTER — Telehealth (HOSPITAL_BASED_OUTPATIENT_CLINIC_OR_DEPARTMENT_OTHER): Payer: Self-pay | Admitting: Emergency Medicine

## 2015-11-09 LAB — ROCKY MTN SPOTTED FVR ABS PNL(IGG+IGM)
RMSF IGM: 0.69 {index} (ref 0.00–0.89)
RMSF IgG: NEGATIVE

## 2015-11-24 ENCOUNTER — Emergency Department (HOSPITAL_COMMUNITY): Payer: Self-pay

## 2015-11-24 ENCOUNTER — Encounter (HOSPITAL_COMMUNITY): Payer: Self-pay | Admitting: Emergency Medicine

## 2015-11-24 ENCOUNTER — Emergency Department (HOSPITAL_COMMUNITY)
Admission: EM | Admit: 2015-11-24 | Discharge: 2015-11-24 | Disposition: A | Discharge status: Home / Self Care | Payer: Self-pay | Attending: Emergency Medicine | Admitting: Emergency Medicine

## 2015-11-24 DIAGNOSIS — F17210 Nicotine dependence, cigarettes, uncomplicated: Secondary | ICD-10-CM | POA: Insufficient documentation

## 2015-11-24 DIAGNOSIS — J189 Pneumonia, unspecified organism: Secondary | ICD-10-CM | POA: Insufficient documentation

## 2015-11-24 DIAGNOSIS — N83202 Unspecified ovarian cyst, left side: Secondary | ICD-10-CM | POA: Insufficient documentation

## 2015-11-24 DIAGNOSIS — F319 Bipolar disorder, unspecified: Secondary | ICD-10-CM | POA: Insufficient documentation

## 2015-11-24 DIAGNOSIS — N83201 Unspecified ovarian cyst, right side: Secondary | ICD-10-CM | POA: Insufficient documentation

## 2015-11-24 DIAGNOSIS — R1030 Lower abdominal pain, unspecified: Secondary | ICD-10-CM | POA: Insufficient documentation

## 2015-11-24 DIAGNOSIS — Z7982 Long term (current) use of aspirin: Secondary | ICD-10-CM | POA: Insufficient documentation

## 2015-11-24 HISTORY — DX: Acute pancreatitis without necrosis or infection, unspecified: K85.90

## 2015-11-24 LAB — CBC WITH DIFFERENTIAL/PLATELET
BASOS PCT: 0 %
Basophils Absolute: 0 10*3/uL (ref 0.0–0.1)
EOS ABS: 0.6 10*3/uL (ref 0.0–0.7)
Eosinophils Relative: 5 %
HCT: 43.3 % (ref 36.0–46.0)
HEMOGLOBIN: 14.6 g/dL (ref 12.0–15.0)
Lymphocytes Relative: 10 %
Lymphs Abs: 1.4 10*3/uL (ref 0.7–4.0)
MCH: 29.9 pg (ref 26.0–34.0)
MCHC: 33.7 g/dL (ref 30.0–36.0)
MCV: 88.5 fL (ref 78.0–100.0)
MONOS PCT: 6 %
Monocytes Absolute: 0.8 10*3/uL (ref 0.1–1.0)
NEUTROS PCT: 79 %
Neutro Abs: 11 10*3/uL — ABNORMAL HIGH (ref 1.7–7.7)
PLATELETS: 265 10*3/uL (ref 150–400)
RBC: 4.89 MIL/uL (ref 3.87–5.11)
RDW: 14.3 % (ref 11.5–15.5)
WBC: 13.9 10*3/uL — AB (ref 4.0–10.5)

## 2015-11-24 LAB — COMPREHENSIVE METABOLIC PANEL
ALBUMIN: 3.2 g/dL — AB (ref 3.5–5.0)
ALK PHOS: 107 U/L (ref 38–126)
ALT: 25 U/L (ref 14–54)
ANION GAP: 7 (ref 5–15)
AST: 25 U/L (ref 15–41)
BUN: 5 mg/dL — ABNORMAL LOW (ref 6–20)
CALCIUM: 8.7 mg/dL — AB (ref 8.9–10.3)
CHLORIDE: 104 mmol/L (ref 101–111)
CO2: 27 mmol/L (ref 22–32)
Creatinine, Ser: 0.68 mg/dL (ref 0.44–1.00)
GFR calc non Af Amer: >60 mL/min (ref >60)
GLUCOSE: 116 mg/dL — AB (ref 65–99)
POTASSIUM: 4 mmol/L (ref 3.5–5.1)
SODIUM: 138 mmol/L (ref 135–145)
Total Bilirubin: 0.7 mg/dL (ref 0.3–1.2)
Total Protein: 6.1 g/dL — ABNORMAL LOW (ref 6.5–8.1)

## 2015-11-24 LAB — URINALYSIS, ROUTINE W REFLEX MICROSCOPIC
BILIRUBIN URINE: NEGATIVE
Glucose, UA: NEGATIVE mg/dL
Hgb urine dipstick: NEGATIVE
Ketones, ur: NEGATIVE mg/dL
LEUKOCYTES UA: NEGATIVE
NITRITE: NEGATIVE
PH: 5.5 (ref 5.0–8.0)
Protein, ur: NEGATIVE mg/dL
SPECIFIC GRAVITY, URINE: 1.023 (ref 1.005–1.030)

## 2015-11-24 LAB — LIPASE, BLOOD: Lipase: 14 U/L (ref 11–51)

## 2015-11-24 MED ORDER — ONDANSETRON 4 MG PO TBDP: 4.0000 mg | ORAL_TABLET | Freq: Three times a day (TID) | ORAL | 0 refills | Status: DC | PRN

## 2015-11-24 MED ORDER — HYDROMORPHONE HCL 1 MG/ML IJ SOLN
1.0000 mg | Freq: Once | INTRAMUSCULAR | Status: AC
  Administered 2015-11-24: 1 mg via INTRAVENOUS
  Filled 2015-11-24: qty 1

## 2015-11-24 MED ORDER — ALBUTEROL SULFATE (2.5 MG/3ML) 0.083% IN NEBU
2.5000 mg | INHALATION_SOLUTION | Freq: Once | RESPIRATORY_TRACT | Status: AC
  Administered 2015-11-24: 2.5 mg via RESPIRATORY_TRACT
  Filled 2015-11-24: qty 3

## 2015-11-24 MED ORDER — BENZONATATE 100 MG PO CAPS: 200.0000 mg | ORAL_CAPSULE | Freq: Two times a day (BID) | ORAL | 0 refills | Status: DC | PRN

## 2015-11-24 MED ORDER — CEPHALEXIN 500 MG PO CAPS: 500.0000 mg | ORAL_CAPSULE | Freq: Two times a day (BID) | ORAL | 0 refills | Status: DC

## 2015-11-24 MED ORDER — ONDANSETRON HCL 4 MG/2ML IJ SOLN
4.0000 mg | Freq: Once | INTRAMUSCULAR | Status: AC
  Administered 2015-11-24: 4 mg via INTRAVENOUS
  Filled 2015-11-24: qty 2

## 2015-11-24 MED ORDER — SODIUM CHLORIDE 0.9 % IV BOLUS (SEPSIS)
1000.0000 mL | Freq: Once | INTRAVENOUS | Status: AC
  Administered 2015-11-24: 1000 mL via INTRAVENOUS

## 2015-11-24 MED ORDER — IOPAMIDOL (ISOVUE-300) INJECTION 61%
INTRAVENOUS | Status: AC
  Administered 2015-11-24: 100 mL
  Filled 2015-11-24: qty 100

## 2015-11-24 NOTE — Discharge Instructions (Signed)
Take your medications as prescribed. You may also take Tylenol and/or ibuprofen as prescribed over-the-counter as needed for pain relief. Continue to give fluids at home. Hydrated. Follow up with your primary care provider within the next week as needed. I recommend following up with the women's clinic listed above for further evaluation/management of your ovarian cysts. Please return to the Emergency Department if symptoms worsen or new onset of fever, chest pain, difficulty breathing, coughing up blood, abdominal pain, vomiting, unable to keep fluids down.

## 2015-11-24 NOTE — ED Notes (Signed)
Patient upset about not going home with anything for her pain/discomfort. PA-C made aware and provided patient with prescription for nausea and told her she may take ibuprofen for her discomfort. Patient then denied any further needs or questions, VSS, patient ambulatory with steady gait.

## 2015-11-24 NOTE — ED Provider Notes (Signed)
[facility]-EMERGENCY DEPT Provider Note   CSN: [id number] Arrival date & time: 11/24/15  1009     History   Chief Complaint Chief Complaint  Patient presents with  . Abdominal Pain  . Vomiting    HPI Katherine Bond is a 40 y.o. female.  Patient is a 40 year old female with past medical history of pancreatitis, bipolar disorder and epilepsy who presents to the ED with complaint of abdominal pain, onset yesterday. Patient reports yesterday evening she began having constant sharp stabbing pain to her upper abdomen with intermittent cramping pain to her lower abdomen. Denies radiation, denies any aggravating or alleviating factors. Endorses associated nausea and multiple episodes of NBNB vomiting. Patient reports she feels like she has had a bowel movement when her lower abdomen is cramping and notes her pain intermittently improves after having a small bowel movement. Last bowel movement was in triage. She notes her pain feels similar to when she has had pancreatitis in the past, last episode 5-6 years ago. Denies fever, chills, headache, lightheadedness, dizziness, hematemesis, diarrhea, urinary symptoms, blood in urine or stool, vaginal bleeding, vaginal discharge. Endorses abdominal surgical history of pancreaticoduodenectomy, cholecystectomy, partial hysterectomy. Denies taking any medications prior to arrival.  Patient also reports she has had a nonproductive cough, wheezing and intermittent shortness of breath over the past 2 weeks since she has quit smoking. She reports having mild midsternal chest discomfort that only occurs with coughing or when she has been vomiting. Endorses associated rhinorrhea, nasal congestion and sore throat. Denies taking any medications at home for her symptoms. Denies fever, headache, neck pain, neck stiffness, ear pain, dysphasia, facial/neck swelling, hemoptysis, palpitations, lightheadedness, dizziness.      Past Medical History:  Diagnosis Date  . Bipolar 1  disorder (HCC)   . Depression   . Epilepsy (HCC)   . Pancreatitis     Patient Active Problem List   Diagnosis Date Noted  . Alcohol abuse 09/27/2015  . 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 PTSD (post-traumatic stress disorder) 09/16/2015  . Panic disorder 11/05/2014  . Convulsion (HCC)   . MDD (major depressive disorder), recurrent severe, without psychosis (HCC)     Past Surgical History:  Procedure Laterality Date  . ABDOMINAL HYSTERECTOMY     partial   . CHOLECYSTECTOMY    . DENTAL SURGERY    . PANCREATICODUODENECTOMY      OB History    No data available       Home Medications    Prior to Admission medications   Medication Sig Start Date End Date Taking? Authorizing Provider  DULoxetine (CYMBALTA) 60 MG capsule Take 1 capsule (60 mg total) by mouth daily. For depression 09/19/15  Yes Sanjuana Kava, NP  gabapentin (NEURONTIN) 300 MG capsule Take 1 capsule (300 mg total) by mouth 4 (four) times daily -  with meals and at bedtime. For agitation 09/19/15  Yes Sanjuana Kava, NP  hydrOXYzine (ATARAX/VISTARIL) 25 MG tablet Take 1 tablet (25 mg total) by mouth every 6 (six) hours as needed for anxiety or itching. 11/05/15  Yes Danelle Berry, PA-C  phenytoin (DILANTIN) 200 MG ER capsule Take 1 capsule (200 mg total) by mouth 2 (two) times daily. For seizure disorder 09/19/15  Yes Sanjuana Kava, NP  aspirin EC 325 MG tablet Take 650 mg by mouth daily as needed for mild pain.    Historical Provider, MD  benzonatate (TESSALON) 100 MG capsule Take 2 capsules (200 mg total) by mouth 2 (two) times daily as  needed for cough. 11/24/15   Barrett Henle, PA-C  cephALEXin (KEFLEX) 500 MG capsule Take 1 capsule (500 mg total) by mouth 2 (two) times daily. 11/24/15   Barrett Henle, PA-C  doxycycline (VIBRAMYCIN) 100 MG capsule Take 1 capsule (100 mg total) by mouth 2 (two) times daily. Patient not taking: Reported on 11/24/2015 11/05/15   Danelle Berry, PA-C  LORazepam (ATIVAN)  0.5 MG tablet Take 1 tablet (0.5 mg total) by mouth every 8 (eight) hours as needed for anxiety. Patient not taking: Reported on 11/24/2015 11/05/15   Danelle Berry, PA-C  ondansetron (ZOFRAN ODT) 4 MG disintegrating tablet Take 1 tablet (4 mg total) by mouth every 8 (eight) hours as needed for nausea or vomiting. 11/24/15   Barrett Henle, PA-C  QUEtiapine (SEROQUEL) 200 MG tablet Take 1 tablet (200 mg total) by mouth at bedtime. For mood control 09/19/15   Sanjuana Kava, NP  QUEtiapine (SEROQUEL) 25 MG tablet Take 1 tablet (25 mg total) by mouth 2 (two) times daily. For agitation 09/19/15   Sanjuana Kava, NP  traZODone (DESYREL) 100 MG tablet Take 1 tablet (100 mg total) by mouth at bedtime as needed for sleep. Patient not taking: Reported on 11/24/2015 09/19/15   Sanjuana Kava, NP    Family History Family History  Problem Relation Age of Onset  . Cancer Mother   . Cancer Father     Social History Social History  Substance Use Topics  . Smoking status: Current Some Day Smoker    Packs/day: 1.00    Years: 15.00    Types: Cigarettes  . Smokeless tobacco: Never Used  . Alcohol use No     Allergies   Demerol and Morphine and related   Review of Systems Review of Systems  Respiratory: Positive for cough, shortness of breath and wheezing.   Cardiovascular: Positive for chest pain (with coughing or vomiting).  Gastrointestinal: Positive for abdominal pain, nausea and vomiting.  All other systems reviewed and are negative.    Physical Exam Updated Vital Signs BP 113/73   Pulse 89   Temp 98.8 F (37.1 C) (Oral)   Resp 25   SpO2 94%   Physical Exam  Constitutional: She is oriented to person, place, and time. She appears well-developed and well-nourished.  Pt appears to be in discomfort and is holding onto her stomach one exam.  HENT:  Head: Normocephalic and atraumatic.  Mouth/Throat: Uvula is midline, oropharynx is clear and moist and mucous membranes are normal. No  oropharyngeal exudate, posterior oropharyngeal edema, posterior oropharyngeal erythema or tonsillar abscesses. No tonsillar exudate.  Eyes: Conjunctivae and EOM are normal. Right eye exhibits no discharge. Left eye exhibits no discharge. No scleral icterus.  Neck: Normal range of motion. Neck supple.  Cardiovascular: Normal rate, regular rhythm, normal heart sounds and intact distal pulses.   Pulmonary/Chest: Effort normal. No respiratory distress. She has wheezes (mild end-expiratory wheezes noted in lower lobes). She has no rales. She exhibits no tenderness.  Abdominal: Soft. Bowel sounds are normal. She exhibits no distension and no mass. There is tenderness (diffuse tenderness). There is no rebound and no guarding. No hernia.  No CVA tenderness  Musculoskeletal: Normal range of motion. She exhibits no edema.  Neurological: She is alert and oriented to person, place, and time.  Skin: Skin is warm and dry. She is not diaphoretic.  Nursing note and vitals reviewed.    ED Treatments / Results  Labs (all labs ordered are listed, but  only abnormal results are displayed) Labs Reviewed  CBC WITH DIFFERENTIAL/PLATELET - Abnormal; Notable for the following:       Result Value   WBC 13.9 (*)    Neutro Abs 11.0 (*)    All other components within normal limits  COMPREHENSIVE METABOLIC PANEL - Abnormal; Notable for the following:    Glucose, Bld 116 (*)    BUN 5 (*)    Calcium 8.7 (*)    Total Protein 6.1 (*)    Albumin 3.2 (*)    All other components within normal limits  LIPASE, BLOOD  URINALYSIS, ROUTINE W REFLEX MICROSCOPIC (NOT AT [facility])    EKG  EKG Interpretation  Date/Time:  Friday November 24 2015 14:18:07 EDT Ventricular Rate:  90 PR Interval:    QRS Duration: 66 QT Interval:  474 QTC Calculation: 581 R Axis:   47 Text Interpretation:  Sinus rhythm Abnormal R-wave progression, early transition Borderline T abnormalities, lateral leads Prolonged QT interval No significant  change since last tracing Confirmed by YAO  MD, DAVID ([id number]) on 11/24/2015 2:56:12 PM       Radiology Dg Chest 2 View  Result Date: 11/24/2015 CLINICAL DATA:  Cough, wheezing and fever. EXAM: CHEST  2 VIEW COMPARISON:  09/02/2015. FINDINGS: Cardiomediastinal silhouette is normal. No pleural effusion. There is bronchial thickening in there are patchy airspace opacities within both lungs consistent with widespread bronchitis and bronchopneumonia. No dense consolidation or lobar collapse. No bone abnormality. IMPRESSION: Widespread bronchitis and patchy bilateral bronchopneumonia. No dense consolidation or collapse. Electronically Signed   By: Paulina Fusi M.D.   On: 11/24/2015 12:59   Ct Abdomen Pelvis W Contrast  Result Date: 11/24/2015 CLINICAL DATA:  Diffuse abdominal pain with nausea vomiting for the past 2 days. EXAM: CT ABDOMEN AND PELVIS WITH CONTRAST TECHNIQUE: Multidetector CT imaging of the abdomen and pelvis was performed using the standard protocol following bolus administration of intravenous contrast. CONTRAST:  ISOVUE-300 IOPAMIDOL (ISOVUE-300) INJECTION 61% COMPARISON:  09/03/2015 FINDINGS: Lower chest: Patchy ground-glass opacities in both lower lobes with associated airway thickening. There is evidence of old granulomatous disease with a calcified granuloma in the left lower lobe. Hepatobiliary: Cholecystectomy. Pneumobilia, as on 09/03/2015. Prior Whipple procedure. Pancreas: Chronic calcific pancreatitis. Pancreatic head and body are absent status post Whipple procedure. No recurrent mass. Spleen: Punctate calcifications compatible with old granulomatous disease. Adrenals/Urinary Tract: Unremarkable Stomach/Bowel: Patent gastrojejunostomy.  Appendix unremarkable. Vascular/Lymphatic: Unremarkable Reproductive: Low-density ovaries bilaterally, possibly ovarian cysts. Partial hysterectomy. Other: No supplemental non-categorized findings. Musculoskeletal: Chronic mild superior endplate  compression at T11, no change from 09/03/2015. IMPRESSION: 1. Bilateral airway thickening with some patchy ground-glass opacities in the lung bases probably from air trapping or mild alveolitis. 2. Prior Whipple procedure. 3. Old granulomatous disease. 4. Punctate calcifications in the remaining pancreatic tail compatible with chronic calcific pancreatitis. 5. Bilateral ovarian cysts. Electronically Signed   By: Gaylyn Rong M.D.   On: 11/24/2015 14:15    Procedures Procedures (including critical care time)  Medications Ordered in ED Medications  sodium chloride 0.9 % bolus 1,000 mL (0 mLs Intravenous Stopped 11/24/15 1424)  ondansetron (ZOFRAN) injection 4 mg (4 mg Intravenous Given 11/24/15 1153)  HYDROmorphone (DILAUDID) injection 1 mg (1 mg Intravenous Given 11/24/15 1212)  albuterol (PROVENTIL) (2.5 MG/3ML) 0.083% nebulizer solution 2.5 mg (2.5 mg Nebulization Given 11/24/15 1236)  iopamidol (ISOVUE-300) 61 % injection (100 mLs  Contrast Given 11/24/15 1350)     Initial Impression / Assessment and Plan / ED Course  I  have reviewed the triage vital signs and the nursing notes.  Pertinent labs & imaging results that were available during my care of the patient were reviewed by me and considered in my medical decision making (see chart for details).  Clinical Course    Pt presents with abdominal pain with associated nausea and vomiting that started yesterday. History of pancreatitis. Surgical history of pancreaticoduodenectomy, cholecystectomy, partial hysterectomy. VSS. Patient given IV fluids, Zofran and pain meds. Exam revealed diffuse abdominal tenderness, no peritoneal signs, no CVA tenderness. 3 BC 13.9. Labs and urine unremarkable. CT abdomen showed bilateral ovarian cysts and calcifications in the remaining pancreatic tail compatible with chronic calcific pancreatitis. Suspect patient's symptoms are likely related to ovarian cysts. Plan to have patient sent home with symptomatic  treatment and gynecology follow-up. On reevaluation patient is resting comfortably and reports her symptoms have significantly improved. Patient able to tolerate PO.   Patient also presents with cough, wheezing and shortness of breath that started 2 weeks ago when she quit smoking. Endorses associated rhinorrhea, nasal congestion and sore throat. Denies any known sick contacts. Exam revealed mild end expiratory wheezes and lower lobes. Patient given albuterol treatment. This x-ray showed patchy bilateral bronchopneumonia. On reevaluation patient's wheezing has significantly improved. Oxygen saturation greater than 96% on room air. Discussed results and plan for discharge with patient. Plan to discharge patient home with antibiotics (keflex chosen due to pt with noted prolonged QT on EKG) and symptomatically treatment. Advised patient to follow up with PCP. Discussed return precautions with patient.  Final Clinical Impressions(s) / ED Diagnoses   Final diagnoses:  CAP (community acquired pneumonia)  Cysts of both ovaries    New Prescriptions Discharge Medication List as of 11/24/2015  3:13 PM    START taking these medications   Details  benzonatate (TESSALON) 100 MG capsule Take 2 capsules (200 mg total) by mouth 2 (two) times daily as needed for cough., Starting Fri 11/24/2015, Print    cephALEXin (KEFLEX) 500 MG capsule Take 1 capsule (500 mg total) by mouth 2 (two) times daily., Starting Fri 11/24/2015, Print         SonomaNicole Elizabeth November Sypher, PA-C 11/25/15 [id number]    Charlynne Panderavid Hsienta Yao, MD 11/27/15 1150

## 2015-11-24 NOTE — ED Triage Notes (Addendum)
Pt states having abd pain "like contractions" -- feels like pancreatitis-- has not had an attack for 5-6 yrs. Crying.  Pt has had a whipple procedure 9-10 yrs ago

## 2016-03-21 ENCOUNTER — Ambulatory Visit (HOSPITAL_COMMUNITY)
Admission: EM | Admit: 2016-03-21 | Discharge: 2016-03-21 | Disposition: A | Discharge status: Home / Self Care | Payer: Self-pay | Attending: Emergency Medicine | Admitting: Emergency Medicine

## 2016-03-21 ENCOUNTER — Encounter (HOSPITAL_COMMUNITY): Payer: Self-pay | Admitting: Emergency Medicine

## 2016-03-21 DIAGNOSIS — K0889 Other specified disorders of teeth and supporting structures: Secondary | ICD-10-CM

## 2016-03-21 MED ORDER — HYDROCODONE-ACETAMINOPHEN 5-325 MG PO TABS: 1.0000 | ORAL_TABLET | ORAL | 0 refills | Status: DC | PRN

## 2016-03-21 MED ORDER — AMOXICILLIN 500 MG PO CAPS: 1000.0000 mg | ORAL_CAPSULE | Freq: Two times a day (BID) | ORAL | 0 refills | Status: DC

## 2016-03-21 NOTE — ED Provider Notes (Signed)
CSN: [id number]     Arrival date & time 03/21/16  1913 History   First MD Initiated Contact with Patient 03/21/16 1954     Chief Complaint  Patient presents with  . Dental Pain   (Consider location/radiation/quality/duration/timing/severity/associated sxs/prior Treatment) 41-year-old female complaining of a toothache for a year. Cut worse today. It is the right lower bicuspid #28. Patient states she feels like gets abscessed with pain in the submental/submandibular area radiating down the right side of her neck. No problems with swallowing. No fever or chills. She states she does not have a Education officer, communitydentist or insurance.      Past Medical History:  Diagnosis Date  . Bipolar 1 disorder (HCC)   . Depression   . Epilepsy (HCC)   . Pancreatitis    Past Surgical History:  Procedure Laterality Date  . ABDOMINAL HYSTERECTOMY     partial   . CHOLECYSTECTOMY    . DENTAL SURGERY    . PANCREATICODUODENECTOMY     Family History  Problem Relation Age of Onset  . Cancer Mother   . Cancer Father    Social History  Substance Use Topics  . Smoking status: Current Some Day Smoker    Packs/day: 1.00    Years: 15.00    Types: Cigarettes  . Smokeless tobacco: Never Used  . Alcohol use No   OB History    No data available     Review of Systems  Constitutional: Negative.   HENT: Positive for dental problem.   Respiratory: Negative.   Gastrointestinal: Negative.   Neurological: Negative.   All other systems reviewed and are negative.   Allergies  Demerol and Morphine and related  Home Medications   Prior to Admission medications   Medication Sig Start Date End Date Taking? Authorizing Provider  gabapentin (NEURONTIN) 300 MG capsule Take 1 capsule (300 mg total) by mouth 4 (four) times daily -  with meals and at bedtime. For agitation 09/19/15  Yes Sanjuana KavaAgnes I Nwoko, NP  hydrOXYzine (ATARAX/VISTARIL) 25 MG tablet Take 1 tablet (25 mg total) by mouth every 6 (six) hours as needed for anxiety or  itching. 11/05/15  Yes Danelle BerryLeisa Tapia, PA-C  phenytoin (DILANTIN) 200 MG ER capsule Take 1 capsule (200 mg total) by mouth 2 (two) times daily. For seizure disorder 09/19/15  Yes Sanjuana KavaAgnes I Nwoko, NP  QUEtiapine (SEROQUEL) 200 MG tablet Take 1 tablet (200 mg total) by mouth at bedtime. For mood control 09/19/15  Yes Sanjuana KavaAgnes I Nwoko, NP  QUEtiapine (SEROQUEL) 25 MG tablet Take 1 tablet (25 mg total) by mouth 2 (two) times daily. For agitation 09/19/15  Yes Sanjuana KavaAgnes I Nwoko, NP  sertraline (ZOLOFT) 100 MG tablet Take 100 mg by mouth daily.   Yes Historical Provider, MD  amoxicillin (AMOXIL) 500 MG capsule Take 2 capsules (1,000 mg total) by mouth 2 (two) times daily. 03/21/16   Hayden Rasmussenavid Oziel Beitler, NP  HYDROcodone-acetaminophen (NORCO/VICODIN) 5-325 MG tablet Take 1 tablet by mouth every 4 (four) hours as needed. 03/21/16   Hayden Rasmussenavid Sofhia Ulibarri, NP   Meds Ordered and Administered this Visit  Medications - No data to display  BP 120/80 (BP Location: Left Arm)   Pulse 81   Temp 98.4 F (36.9 C) (Oral)   Resp 18   SpO2 98%  No data found.   Physical Exam  Constitutional: She appears well-developed and well-nourished. No distress.  HENT:  Head: Normocephalic and atraumatic.  Mouth/Throat: Oropharynx is clear and moist.  Poor dentition. Upper teeth are dentures.. Lower  natural teeth remaining. There is minor tenderness and sensitivity to the right lower lateral incisor and bicuspid. No abscess formation is seen. Mild gingival hyperplasia. No facial swelling.  Eyes: EOM are normal.  Neck: Normal range of motion. Neck supple.  Pulmonary/Chest: Effort normal.  Lymphadenopathy:    She has cervical adenopathy.  Skin: Skin is warm and dry.  Psychiatric: She has a normal mood and affect.  Nursing note and vitals reviewed.   Urgent Care Course   Clinical Course     Procedures (including critical care time)  Labs Review Labs Reviewed - No data to display  Imaging Review No results found.   Visual Acuity Review  Right Eye  Distance:   Left Eye Distance:   Bilateral Distance:    Right Eye Near:   Left Eye Near:    Bilateral Near:         MDM   1. Pain, dental    Take the antibiotics as directed take the pain medicine as directed along with ibuprofen. It is imperative that she follow-up with a dentist for your chronic toothache. If you need additional medication here primary care provider can provide that for you as well. Meds ordered this encounter  Medications  . sertraline (ZOLOFT) 100 MG tablet    Sig: Take 100 mg by mouth daily.  Marland Kitchen amoxicillin (AMOXIL) 500 MG capsule    Sig: Take 2 capsules (1,000 mg total) by mouth 2 (two) times daily.    Dispense:  32 capsule    Refill:  0    Order Specific Question:   Supervising Provider    Answer:   Domenick Gong [[id number]]  . HYDROcodone-acetaminophen (NORCO/VICODIN) 5-325 MG tablet    Sig: Take 1 tablet by mouth every 4 (four) hours as needed.    Dispense:  12 tablet    Refill:  0    Order Specific Question:   Supervising Provider    Answer:   Domenick Gong [[id number]]      Hayden Rasmussen, NP 03/21/16 2008

## 2016-03-21 NOTE — ED Triage Notes (Signed)
Here for right lower dental pain onset 2 days associated w/fevers, chills  A&O x4... NAD

## 2016-03-21 NOTE — Discharge Instructions (Signed)
Take the antibiotics as directed take the pain medicine as directed along with ibuprofen. It is imperative that she follow-up with a dentist for your chronic toothache. If you need additional medication here primary care provider can provide that for you as well.

## 2016-04-09 ENCOUNTER — Emergency Department (HOSPITAL_COMMUNITY)
Admission: EM | Admit: 2016-04-09 | Discharge: 2016-04-09 | Disposition: A | Discharge status: Home / Self Care | Payer: Self-pay | Attending: Dermatology | Admitting: Dermatology

## 2016-04-09 DIAGNOSIS — M549 Dorsalgia, unspecified: Secondary | ICD-10-CM | POA: Insufficient documentation

## 2016-04-09 DIAGNOSIS — Z5321 Procedure and treatment not carried out due to patient leaving prior to being seen by health care provider: Secondary | ICD-10-CM | POA: Insufficient documentation

## 2016-04-09 NOTE — ED Notes (Addendum)
Pt called for in waiting area for triage x3. No answer

## 2016-04-09 NOTE — ED Notes (Signed)
Pt called foer in waiting area x3 for triage. No answer

## 2016-04-10 ENCOUNTER — Emergency Department (HOSPITAL_COMMUNITY)
Admission: EM | Admit: 2016-04-10 | Discharge: 2016-04-10 | Disposition: A | Discharge status: Home / Self Care | Payer: Self-pay | Attending: Emergency Medicine | Admitting: Emergency Medicine

## 2016-04-10 ENCOUNTER — Encounter (HOSPITAL_COMMUNITY): Payer: Self-pay | Admitting: *Deleted

## 2016-04-10 ENCOUNTER — Emergency Department (HOSPITAL_COMMUNITY): Payer: Self-pay

## 2016-04-10 DIAGNOSIS — F17210 Nicotine dependence, cigarettes, uncomplicated: Secondary | ICD-10-CM | POA: Insufficient documentation

## 2016-04-10 DIAGNOSIS — K0889 Other specified disorders of teeth and supporting structures: Secondary | ICD-10-CM | POA: Insufficient documentation

## 2016-04-10 DIAGNOSIS — R42 Dizziness and giddiness: Secondary | ICD-10-CM | POA: Insufficient documentation

## 2016-04-10 DIAGNOSIS — W19XXXA Unspecified fall, initial encounter: Secondary | ICD-10-CM

## 2016-04-10 LAB — BASIC METABOLIC PANEL
Anion gap: 7 (ref 5–15)
BUN: 10 mg/dL (ref 6–20)
CHLORIDE: 105 mmol/L (ref 101–111)
CO2: 26 mmol/L (ref 22–32)
CREATININE: 0.67 mg/dL (ref 0.44–1.00)
Calcium: 8.8 mg/dL — ABNORMAL LOW (ref 8.9–10.3)
GFR calc Af Amer: >60 mL/min (ref >60)
GFR calc non Af Amer: >60 mL/min (ref >60)
Glucose, Bld: 131 mg/dL — ABNORMAL HIGH (ref 65–99)
POTASSIUM: 5.1 mmol/L (ref 3.5–5.1)
SODIUM: 138 mmol/L (ref 135–145)

## 2016-04-10 LAB — CBC
HEMATOCRIT: 40.3 % (ref 36.0–46.0)
Hemoglobin: 12.9 g/dL (ref 12.0–15.0)
MCH: 29.4 pg (ref 26.0–34.0)
MCHC: 32 g/dL (ref 30.0–36.0)
MCV: 91.8 fL (ref 78.0–100.0)
Platelets: 271 10*3/uL (ref 150–400)
RBC: 4.39 MIL/uL (ref 3.87–5.11)
RDW: 15.7 % — AB (ref 11.5–15.5)
WBC: 7.5 10*3/uL (ref 4.0–10.5)

## 2016-04-10 LAB — URINALYSIS, ROUTINE W REFLEX MICROSCOPIC
Bilirubin Urine: NEGATIVE
GLUCOSE, UA: NEGATIVE mg/dL
HGB URINE DIPSTICK: NEGATIVE
Ketones, ur: NEGATIVE mg/dL
Leukocytes, UA: NEGATIVE
Nitrite: NEGATIVE
PH: 5 (ref 5.0–8.0)
Protein, ur: NEGATIVE mg/dL
Specific Gravity, Urine: 1.021 (ref 1.005–1.030)

## 2016-04-10 MED ORDER — OXYCODONE-ACETAMINOPHEN 5-325 MG PO TABS
1.0000 | ORAL_TABLET | Freq: Once | ORAL | Status: AC
  Administered 2016-04-10: 1 via ORAL
  Filled 2016-04-10: qty 1

## 2016-04-10 MED ORDER — MECLIZINE HCL 25 MG PO TABS: 25.0000 mg | ORAL_TABLET | Freq: Three times a day (TID) | ORAL | 0 refills | Status: DC | PRN

## 2016-04-10 NOTE — ED Triage Notes (Signed)
Pt reports recent dental infection, has been seen and treated with antibiotics for it. Reports taking meds as prescribed but still having generalized weakness which is causing frequent falls. Has bruising on her back from falling, VSS.

## 2016-04-10 NOTE — Discharge Instructions (Signed)
Your symptoms are possibly due to vertigo. You can try Meclizine as needed. This medication can make you drowsy so please be careful. Please follow up with a dentist as soon as possible. You can take Inbuprofen/Tylenol for your pain.

## 2016-04-10 NOTE — ED Notes (Signed)
Upon going over pt discharge instructions she became tearful and speaking loudly "I am not a drug user I come here a lot and I never get pain medication." MD informed and speaking to patient. Pt loudly yelling in hallway. Security was called.

## 2016-04-10 NOTE — ED Provider Notes (Signed)
[facility]Y DEPT Provider Note   CSN: [id number] Arrival date & time: 04/10/16  1047     History   Chief Complaint Chief Complaint  Patient presents with  . Weakness  . Dental Pain    HPI Katherine Bond is a 41 y.o. female with PMH of Bipolar DO, Depression, Epilepsy who presents with tooth pain and back pain after multiple falls.   HPI Patient reports that she was seen in urgent care early this month for dental pain and was prescribed an antibiotic. Reports she completed antibiotic a few days ago, but her pain has been unbearable. She also reports she has been having a lot of falls over the past week which she feels like is due to her worsening tooth pain. She has also been feeling dizzy/lightheaded over the past week; denies feeling like the room is spinning. She denies weakness in her extremities or weakness on one side over the other. Has had chills for the past few days but unsure if she has had fevers.  Has tried [facility] powder and ibuprofen without relief of pain.   Past Medical History:  Diagnosis Date  . Bipolar 1 disorder (HCC)   . Depression   . Epilepsy (HCC)   . Pancreatitis     Patient Active Problem List   Diagnosis Date Noted  . Alcohol abuse 09/27/2015  . 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 PTSD (post-traumatic stress disorder) 09/16/2015  . Panic disorder 11/05/2014  . Convulsion (HCC)   . MDD (major depressive disorder), recurrent severe, without psychosis (HCC)     Past Surgical History:  Procedure Laterality Date  . ABDOMINAL HYSTERECTOMY     partial   . CHOLECYSTECTOMY    . DENTAL SURGERY    . PANCREATICODUODENECTOMY      OB History    No data available       Home Medications    Prior to Admission medications   Medication Sig Start Date End Date Taking? Authorizing Provider  amoxicillin (AMOXIL) 500 MG capsule Take 2 capsules (1,000 mg total) by mouth 2 (two) times daily. 03/21/16   Hayden Rasmussen, NP  gabapentin (NEURONTIN) 300 MG  capsule Take 1 capsule (300 mg total) by mouth 4 (four) times daily -  with meals and at bedtime. For agitation 09/19/15   Sanjuana Kava, NP  HYDROcodone-acetaminophen (NORCO/VICODIN) 5-325 MG tablet Take 1 tablet by mouth every 4 (four) hours as needed. 03/21/16   Hayden Rasmussen, NP  hydrOXYzine (ATARAX/VISTARIL) 25 MG tablet Take 1 tablet (25 mg total) by mouth every 6 (six) hours as needed for anxiety or itching. 11/05/15   Danelle Berry, PA-C  meclizine (ANTIVERT) 25 MG tablet Take 1 tablet (25 mg total) by mouth 3 (three) times daily as needed for dizziness. 04/10/16   Palma Holter, MD  phenytoin (DILANTIN) 200 MG ER capsule Take 1 capsule (200 mg total) by mouth 2 (two) times daily. For seizure disorder 09/19/15   Sanjuana Kava, NP  QUEtiapine (SEROQUEL) 200 MG tablet Take 1 tablet (200 mg total) by mouth at bedtime. For mood control 09/19/15   Sanjuana Kava, NP  QUEtiapine (SEROQUEL) 25 MG tablet Take 1 tablet (25 mg total) by mouth 2 (two) times daily. For agitation 09/19/15   Sanjuana Kava, NP  sertraline (ZOLOFT) 100 MG tablet Take 100 mg by mouth daily.    Historical Provider, MD    Family History Family History  Problem Relation Age of Onset  . Cancer Mother   .  Cancer Father     Social History Social History  Substance Use Topics  . Smoking status: Current Some Day Smoker    Packs/day: 1.00    Years: 15.00    Types: Cigarettes  . Smokeless tobacco: Never Used  . Alcohol use No     Allergies   Demerol and Morphine and related   Review of Systems Review of Systems  Constitutional: Negative for fever.       Sweating at night.   HENT: Positive for dental problem and ear pain. Negative for drooling, rhinorrhea, sinus pain, sinus pressure and sore throat.   Respiratory: Negative for cough.   Cardiovascular: Negative for chest pain.  Gastrointestinal: Negative for abdominal pain, diarrhea, nausea and vomiting.  Genitourinary: Positive for frequency. Negative for difficulty  urinating, dysuria and hematuria.  Musculoskeletal: Positive for back pain.       Falls  Neurological: Positive for dizziness and light-headedness. Negative for facial asymmetry, speech difficulty, weakness, numbness and headaches.     Physical Exam Updated Vital Signs BP 133/84 (BP Location: Left Arm)   Pulse 72   Temp 98.7 F (37.1 C) (Oral)   Resp 20   Ht 5\' 4"  (1.626 m)   Wt 72.6 kg   SpO2 96%   BMI 27.46 kg/m   Physical Exam  Constitutional: She is oriented to person, place, and time. She appears well-developed and well-nourished.  HENT:  Mouth/Throat: Oropharynx is clear and moist.  Bilateral effusions on exam on tympanic membranes but no erythema. No tenderness to palpation of gums.  Eyes: Conjunctivae and EOM are normal. Pupils are equal, round, and reactive to light. Right eye exhibits no discharge. Left eye exhibits no discharge.  Slight nystagmus with right latera gaze. Reports of dizziness with left lateral gaze.   Neck: Normal range of motion.  Cardiovascular: Normal rate, regular rhythm and normal heart sounds.   Pulmonary/Chest: Effort normal and breath sounds normal. No respiratory distress. She has no wheezes. She has no rales.  Abdominal: Soft. Bowel sounds are normal. There is no tenderness.  Musculoskeletal:  Tenderness to palpation of Lumbar spine and sacrum. There is ecchymosis of her lower back and buttock region. Very tender to palpation of this entire area.  Mild tenderness to palpation of her bilateral knees but not effusion noted and no laxity of ligaments. Able to walk around in the ED.   Lymphadenopathy:    She has no cervical adenopathy.  Neurological: She is alert and oriented to person, place, and time. She displays normal reflexes. No cranial nerve deficit or sensory deficit. Coordination normal.  Normal gait  Skin: Skin is warm and dry. Capillary refill takes less than 2 seconds.  Psychiatric: She has a normal mood and affect. Her behavior is  normal.     ED Treatments / Results  Labs (all labs ordered are listed, but only abnormal results are displayed) Labs Reviewed  BASIC METABOLIC PANEL - Abnormal; Notable for the following:       Result Value   Glucose, Bld 131 (*)    Calcium 8.8 (*)    All other components within normal limits  CBC - Abnormal; Notable for the following:    RDW 15.7 (*)    All other components within normal limits  URINALYSIS, ROUTINE W REFLEX MICROSCOPIC - Abnormal; Notable for the following:    APPearance HAZY (*)    All other components within normal limits    EKG  EKG Interpretation None  Radiology Dg Lumbar Spine Complete  Result Date: 04/10/2016 CLINICAL DATA:  Status post fall.  Right side and back pain. EXAM: LUMBAR SPINE - COMPLETE 4+ VIEW COMPARISON:  11/24/2015 FINDINGS: There is no evidence of acute lumbar spine fracture. Chronic wedge deformity of T11 is stable from previous exam. There is a leftward curvature of the lumbar spine which is convex towards the left. Intervertebral disc spaces are maintained. Calcifications of chronic pancreatitis noted. IMPRESSION: 1. Scoliosis 2. Chronic wedge deformity of T11. Electronically Signed   By: Signa Kell M.D.   On: 04/10/2016 13:48   Dg Hips Bilat With Pelvis 3-4 Views  Result Date: 04/10/2016 CLINICAL DATA:  Multiple falls recently, right-sided hip and back pain EXAM: DG HIP (WITH OR WITHOUT PELVIS) 3-4V BILAT COMPARISON:  CT abdomen and pelvis of 11/24/2015 FINDINGS: Views of the pelvis and both hips show both femoral heads to be in normal position with normal joint spaces for age. No hip fracture is seen. The pelvic rami are intact. The SI joints appear corticated and the sacral foramina are unremarkable. IMPRESSION: No acute fracture. Electronically Signed   By: Dwyane Dee M.D.   On: 04/10/2016 13:58    Procedures Procedures (including critical care time)  Medications Ordered in ED Medications  oxyCODONE-acetaminophen  (PERCOCET/ROXICET) 5-325 MG per tablet 1 tablet (1 tablet Oral Given 04/10/16 1320)     Initial Impression / Assessment and Plan / ED Course  I have reviewed the triage vital signs and the nursing notes.  Pertinent labs & imaging results that were available during my care of the patient were reviewed by me and considered in my medical decision making (see chart for details).  Symptoms of dizziness and subsequent falls likely due to vertigo. X-ray of lumbar spine and bilateral hips did not show acute process. Will prescribe Meclizine PRN. NO tenderness to palpation of teeth or gums. Provided list of resources for dentists. Recommended follow up with PCP. Return precautions discussed  Final Clinical Impressions(s) / ED Diagnoses   Final diagnoses:  Vertigo    New Prescriptions New Prescriptions   MECLIZINE (ANTIVERT) 25 MG TABLET    Take 1 tablet (25 mg total) by mouth 3 (three) times daily as needed for dizziness.     Palma Holter, MD 04/10/16 1504    Melene Plan, DO 04/10/16 1610

## 2016-08-11 ENCOUNTER — Encounter (HOSPITAL_COMMUNITY): Payer: Self-pay

## 2016-08-11 ENCOUNTER — Emergency Department (HOSPITAL_COMMUNITY)
Admission: EM | Admit: 2016-08-11 | Discharge: 2016-08-11 | Disposition: A | Discharge status: Home / Self Care | Payer: Self-pay | Attending: Emergency Medicine | Admitting: Emergency Medicine

## 2016-08-11 DIAGNOSIS — Z79899 Other long term (current) drug therapy: Secondary | ICD-10-CM | POA: Insufficient documentation

## 2016-08-11 DIAGNOSIS — F17210 Nicotine dependence, cigarettes, uncomplicated: Secondary | ICD-10-CM | POA: Insufficient documentation

## 2016-08-11 DIAGNOSIS — R55 Syncope and collapse: Secondary | ICD-10-CM | POA: Insufficient documentation

## 2016-08-11 MED ORDER — SODIUM CHLORIDE 0.9 % IV BOLUS (SEPSIS)
1000.0000 mL | Freq: Once | INTRAVENOUS | Status: AC
  Administered 2016-08-11: 1000 mL via INTRAVENOUS

## 2016-08-11 NOTE — ED Notes (Signed)
Pt resting quietly, playing game on phone. Fluyids infusing

## 2016-08-11 NOTE — ED Triage Notes (Signed)
[facility] this [facility]. Pt became lightheaded and EMS found  On the ground where she sat down her with hypotension of 50/P and c/o cramps at back and stomach. Hx of diarrhea x 2 days. Pt started taking her seroquel again last night where she had been out of it.

## 2016-08-11 NOTE — ED Provider Notes (Signed)
[facility] Provider Note   CSN: [id number] Arrival date & time: 08/11/16  1408     History   Chief Complaint Chief Complaint  Patient presents with  . Near Syncope  . Dizziness    HPI Katherine Bond is a 41 y.o. female.  The history is provided by the patient and medical records. No language interpreter was used.  Near Syncope   Dizziness  Associated symptoms: weakness    Katherine Bond is a 41 y.o. female  with a PMH as listed below who presents to the Emergency Department complaining of dizziness described as feeling like she was going to fall over which began about 30 minutes after donating plasma. This was her 2nd time to donate this week. She also notes that her hands started cramping very badly. She received 250 cc IV fluids en route by EMS. BP 105/68 upon arrival. Upon my evaluation, she had received full 500cc bolus. She states that her cramps and dizziness has now resolved. She still feels weak, but otherwise feels much better. No nausea, vomiting, headache, back pain, abdominal pain, chest pain, trouble breathing. No LOC or fall.    Past Medical History:  Diagnosis Date  . Bipolar 1 disorder (HCC)   . Depression   . Epilepsy (HCC)   . Pancreatitis     Patient Active Problem List   Diagnosis Date Noted  . Alcohol abuse 09/27/2015  . 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 PTSD (post-traumatic stress disorder) 09/16/2015  . Panic disorder 11/05/2014  . Convulsion (HCC)   . MDD (major depressive disorder), recurrent severe, without psychosis (HCC)     Past Surgical History:  Procedure Laterality Date  . ABDOMINAL HYSTERECTOMY     partial   . CHOLECYSTECTOMY    . DENTAL SURGERY    . PANCREATICODUODENECTOMY      OB History    No data available       Home Medications    Prior to Admission medications   Medication Sig Start Date End Date Taking? Authorizing Provider  amoxicillin (AMOXIL) 500 MG capsule Take 2 capsules (1,000 mg total)  by mouth 2 (two) times daily. 03/21/16   Hayden RasmussenMabe, David, NP  gabapentin (NEURONTIN) 300 MG capsule Take 1 capsule (300 mg total) by mouth 4 (four) times daily -  with meals and at bedtime. For agitation 09/19/15   Armandina StammerNwoko, Agnes I, NP  HYDROcodone-acetaminophen (NORCO/VICODIN) 5-325 MG tablet Take 1 tablet by mouth every 4 (four) hours as needed. 03/21/16   Hayden RasmussenMabe, David, NP  hydrOXYzine (ATARAX/VISTARIL) 25 MG tablet Take 1 tablet (25 mg total) by mouth every 6 (six) hours as needed for anxiety or itching. 11/05/15   Danelle Berryapia, Leisa, PA-C  meclizine (ANTIVERT) 25 MG tablet Take 1 tablet (25 mg total) by mouth 3 (three) times daily as needed for dizziness. 04/10/16   Palma HolterGunadasa, Kanishka G, MD  phenytoin (DILANTIN) 200 MG ER capsule Take 1 capsule (200 mg total) by mouth 2 (two) times daily. For seizure disorder 09/19/15   Armandina StammerNwoko, Agnes I, NP  QUEtiapine (SEROQUEL) 200 MG tablet Take 1 tablet (200 mg total) by mouth at bedtime. For mood control 09/19/15   Armandina StammerNwoko, Agnes I, NP  QUEtiapine (SEROQUEL) 25 MG tablet Take 1 tablet (25 mg total) by mouth 2 (two) times daily. For agitation 09/19/15   Armandina StammerNwoko, Agnes I, NP  sertraline (ZOLOFT) 100 MG tablet Take 100 mg by mouth daily.    [provider]    Family History Family History  Problem  Relation Age of Onset  . Cancer Mother   . Cancer Father     Social History Social History  Substance Use Topics  . Smoking status: Current Some Day Smoker    Packs/day: 1.00    Years: 15.00    Types: Cigarettes  . Smokeless tobacco: Never Used  . Alcohol use No     Allergies   Demerol and Morphine and related   Review of Systems Review of Systems  Cardiovascular: Positive for near-syncope.  Musculoskeletal:       + muscle cramps  Neurological: Positive for dizziness (Resolved) and weakness. Negative for syncope and numbness.  All other systems reviewed and are negative.    Physical Exam Updated Vital Signs BP 120/76 (BP Location: Right Arm)   Pulse 75   Temp  98.4 F (36.9 C)   Resp 18   Ht 5\' 4"  (1.626 m)   Wt 68.9 kg (152 lb)   SpO2 96%   BMI 26.09 kg/m   Physical Exam  Constitutional: She is oriented to person, place, and time. She appears well-developed and well-nourished. No distress.  HENT:  Head: Normocephalic and atraumatic.  Cardiovascular: Normal rate, regular rhythm and normal heart sounds.   No murmur heard. Pulmonary/Chest: Effort normal and breath sounds normal. No respiratory distress. She has no wheezes. She has no rales.  Abdominal: Soft. She exhibits no distension. There is no tenderness.  Musculoskeletal: She exhibits no edema.  Neurological: She is alert and oriented to person, place, and time.  Speech clear and goal oriented. CN 2-12 grossly intact. Normal finger-to-nose and rapid alternating movements. No drift. Strength and sensation intact.  Skin: Skin is warm and dry. Capillary refill takes less than 2 seconds.  Nursing note and vitals reviewed.    ED Treatments / Results  Labs (all labs ordered are listed, but only abnormal results are displayed) Labs Reviewed - No data to display  EKG  EKG Interpretation None       Radiology No results found.  Procedures Procedures (including critical care time)  Medications Ordered in ED Medications  sodium chloride 0.9 % bolus 1,000 mL (0 mLs Intravenous Stopped 08/11/16 1608)     Initial Impression / Assessment and Plan / ED Course  I have reviewed the triage vital signs and the nursing notes.  Pertinent labs & imaging results that were available during my care of the patient were reviewed by me and considered in my medical decision making (see chart for details).    Katherine Bond is a 41 y.o. female who presents to ED for dizziness and muscle cramps which began approximately 30 minutes after donating plasma. No LOC. On exam, patient with normal HR. BP 105/68 upon arrival. No focal neuro deficits. She received 500 cc fluids prior to evaluation and states  that she feels weak but no longer dizzy and muscle cramps have resolved. Plan for 1L fluids, chem-8 and orthostatics. On re-evaluation, patient had received full 80% of fluid bolus. Labs and orthostatics still not obtained. Patient states that she feels back to normal and requesting discharge to home. Explained reasoning for lab work and that I would like to check for any electrolyte abnormalities which could be dangerous and potentially even life threatenting. Patient still does not want to wait for labs. BP improved to 120.76. She agrees to return to ER if symptoms return and agrees will increase hydration. All questions answered.   Final Clinical Impressions(s) / ED Diagnoses   Final diagnoses:  Near syncope  New Prescriptions New Prescriptions   No medications on file     Jhony Antrim, Chase Picket, PA-C 08/11/16 1609    Raeford Razor, MD 08/12/16 [phone]

## 2016-08-11 NOTE — Discharge Instructions (Signed)
Increase hydration. Rest.  Follow up with your primary care physician to discuss today's hospital visit.  Return to ER for new or worsening symptoms, any additional concerns.

## 2016-08-11 NOTE — ED Notes (Signed)
Pt states she understands instructions. Home stable with steady gait. 

## 2016-10-16 ENCOUNTER — Encounter (HOSPITAL_COMMUNITY): Payer: Self-pay | Admitting: Emergency Medicine

## 2016-10-16 ENCOUNTER — Emergency Department (HOSPITAL_COMMUNITY): Payer: Self-pay

## 2016-10-16 ENCOUNTER — Emergency Department (HOSPITAL_COMMUNITY)
Admission: EM | Admit: 2016-10-16 | Discharge: 2016-10-16 | Disposition: A | Discharge status: Home / Self Care | Payer: Self-pay | Attending: Emergency Medicine | Admitting: Emergency Medicine

## 2016-10-16 DIAGNOSIS — K209 Esophagitis, unspecified without bleeding: Secondary | ICD-10-CM

## 2016-10-16 DIAGNOSIS — R109 Unspecified abdominal pain: Secondary | ICD-10-CM | POA: Insufficient documentation

## 2016-10-16 DIAGNOSIS — F17210 Nicotine dependence, cigarettes, uncomplicated: Secondary | ICD-10-CM | POA: Insufficient documentation

## 2016-10-16 DIAGNOSIS — K0889 Other specified disorders of teeth and supporting structures: Secondary | ICD-10-CM | POA: Insufficient documentation

## 2016-10-16 DIAGNOSIS — Z79899 Other long term (current) drug therapy: Secondary | ICD-10-CM | POA: Insufficient documentation

## 2016-10-16 DIAGNOSIS — R112 Nausea with vomiting, unspecified: Secondary | ICD-10-CM | POA: Insufficient documentation

## 2016-10-16 DIAGNOSIS — R079 Chest pain, unspecified: Secondary | ICD-10-CM | POA: Insufficient documentation

## 2016-10-16 LAB — COMPREHENSIVE METABOLIC PANEL
ALT: 37 U/L (ref 14–54)
ANION GAP: 10 (ref 5–15)
AST: 31 U/L (ref 15–41)
Albumin: 3.4 g/dL — ABNORMAL LOW (ref 3.5–5.0)
Alkaline Phosphatase: 69 U/L (ref 38–126)
BILIRUBIN TOTAL: 0.6 mg/dL (ref 0.3–1.2)
BUN: 13 mg/dL (ref 6–20)
CHLORIDE: 110 mmol/L (ref 101–111)
CO2: 21 mmol/L — ABNORMAL LOW (ref 22–32)
Calcium: 8.7 mg/dL — ABNORMAL LOW (ref 8.9–10.3)
Creatinine, Ser: 0.65 mg/dL (ref 0.44–1.00)
GFR calc non Af Amer: >60 mL/min (ref >60)
Glucose, Bld: 93 mg/dL (ref 65–99)
POTASSIUM: 3.8 mmol/L (ref 3.5–5.1)
Sodium: 141 mmol/L (ref 135–145)
TOTAL PROTEIN: 5.6 g/dL — AB (ref 6.5–8.1)

## 2016-10-16 LAB — URINALYSIS, ROUTINE W REFLEX MICROSCOPIC
Bilirubin Urine: NEGATIVE
GLUCOSE, UA: NEGATIVE mg/dL
Hgb urine dipstick: NEGATIVE
KETONES UR: 80 mg/dL — AB
LEUKOCYTES UA: NEGATIVE
Nitrite: NEGATIVE
PH: 5 (ref 5.0–8.0)
Protein, ur: 30 mg/dL — AB
SPECIFIC GRAVITY, URINE: 1.033 — AB (ref 1.005–1.030)

## 2016-10-16 LAB — CBC
HCT: 43.1 % (ref 36.0–46.0)
HEMOGLOBIN: 14.8 g/dL (ref 12.0–15.0)
MCH: 30.6 pg (ref 26.0–34.0)
MCHC: 34.3 g/dL (ref 30.0–36.0)
MCV: 89.2 fL (ref 78.0–100.0)
PLATELETS: 233 10*3/uL (ref 150–400)
RBC: 4.83 MIL/uL (ref 3.87–5.11)
RDW: 14 % (ref 11.5–15.5)
WBC: 10.3 10*3/uL (ref 4.0–10.5)

## 2016-10-16 LAB — LIPASE, BLOOD

## 2016-10-16 LAB — I-STAT TROPONIN, ED: TROPONIN I, POC: 0 ng/mL (ref 0.00–0.08)

## 2016-10-16 LAB — POC URINE PREG, ED: PREG TEST UR: NEGATIVE

## 2016-10-16 MED ORDER — HALOPERIDOL LACTATE 5 MG/ML IJ SOLN
5.0000 mg | Freq: Once | INTRAMUSCULAR | Status: AC
  Administered 2016-10-16: 5 mg via INTRAVENOUS
  Filled 2016-10-16: qty 1

## 2016-10-16 MED ORDER — DICYCLOMINE HCL 20 MG PO TABS: 20.0000 mg | ORAL_TABLET | Freq: Two times a day (BID) | ORAL | 0 refills | Status: AC | PRN

## 2016-10-16 MED ORDER — GABAPENTIN 300 MG PO CAPS: 600.0000 mg | ORAL_CAPSULE | Freq: Three times a day (TID) | ORAL | 0 refills | Status: AC

## 2016-10-16 MED ORDER — PENICILLIN V POTASSIUM 500 MG PO TABS: 500.0000 mg | ORAL_TABLET | Freq: Four times a day (QID) | ORAL | 0 refills | Status: AC

## 2016-10-16 MED ORDER — GABAPENTIN 300 MG PO CAPS
300.0000 mg | ORAL_CAPSULE | Freq: Once | ORAL | Status: AC
  Administered 2016-10-16: 300 mg via ORAL
  Filled 2016-10-16: qty 1

## 2016-10-16 MED ORDER — SODIUM CHLORIDE 0.9 % IV BOLUS (SEPSIS)
1000.0000 mL | Freq: Once | INTRAVENOUS | Status: AC
  Administered 2016-10-16: 1000 mL via INTRAVENOUS

## 2016-10-16 MED ORDER — HYDROXYZINE HCL 25 MG PO TABS
25.0000 mg | ORAL_TABLET | Freq: Once | ORAL | Status: AC
  Administered 2016-10-16: 25 mg via ORAL
  Filled 2016-10-16: qty 1

## 2016-10-16 MED ORDER — ONDANSETRON HCL 4 MG/2ML IJ SOLN
4.0000 mg | Freq: Once | INTRAMUSCULAR | Status: AC
  Administered 2016-10-16: 4 mg via INTRAVENOUS
  Filled 2016-10-16: qty 2

## 2016-10-16 MED ORDER — ONDANSETRON 4 MG PO TBDP: 4.0000 mg | ORAL_TABLET | Freq: Three times a day (TID) | ORAL | 0 refills | Status: DC | PRN

## 2016-10-16 MED ORDER — KETOROLAC TROMETHAMINE 30 MG/ML IJ SOLN
30.0000 mg | Freq: Once | INTRAMUSCULAR | Status: AC
  Administered 2016-10-16: 30 mg via INTRAVENOUS
  Filled 2016-10-16: qty 1

## 2016-10-16 MED ORDER — PANTOPRAZOLE SODIUM 20 MG PO TBEC: 20.0000 mg | DELAYED_RELEASE_TABLET | Freq: Two times a day (BID) | ORAL | 0 refills | Status: DC

## 2016-10-16 NOTE — ED Notes (Signed)
Patient not able to provide urine

## 2016-10-16 NOTE — ED Provider Notes (Signed)
[facility]-EMERGENCY DEPT Provider Note   CSN: [id number] Arrival date & time: 10/16/16  1532     History   Chief Complaint Chief Complaint  Patient presents with  . Generalized Body Aches  . Chest Pain  . Anxiety    HPI Tobey BrideLisa Vanhouten is a 41 y.o. female.  HPI  [age] year old female with history of bipolar, depression, epilepsy, pancreatitis, prior alcohol-induced mood disorder, presents with concern for abdominal pain, chest pain, shortness of breath, dental pain and skin lesions.  Reports that for the past 2 days, she's had significant amount of emesis, unable to keep anything down. Reports that she's had over 10 episodes of vomiting per day. Reports she's had epigastric abdominal pain as well as right upper quadrant abdominal pain with some radiation to the back. Reports that sharp in nature and began yesterday. Reports she is not had any diarrhea, however has had frequent flatus. Reports the pain, nausea and vomiting have been severe. She has a history of pancreatitis with withdrawal, and is not sure if this feels the same. Denies recent alcohol use or marijuana use.  She also reports along with the nausea, vomiting and abdominal pain, today she began to x-ray shortness of breath. Reports she thought she was having a panic attack. Reports that she had dyspnea beginning this morning, with some sensation of chest heaviness and feeling like there is something in her throat. Nothing seemed to make it better or worse. It was not exertional, not positional and not pleuritic. No leg pain or swelling. No family history of early heart disease, no prior history of DVT or PE. She denies other drug use, but does report she smokes cigarettes.  She is also concerned regarding dental pain and possible abscess to her right volar. As well as skin lesions that have been present for the last 2-3 months. They're nonpruritic. Reports she just feels like she is "falling apart"  Past Medical History:  Diagnosis Date    . Bipolar 1 disorder (HCC)   . Depression   . Epilepsy (HCC)   . Pancreatitis     Patient Active Problem List   Diagnosis Date Noted  . Alcohol abuse 09/27/2015  . 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 PTSD (post-traumatic stress disorder) 09/16/2015  . Panic disorder 11/05/2014  . Convulsion (HCC)   . MDD (major depressive disorder), recurrent severe, without psychosis (HCC)     Past Surgical History:  Procedure Laterality Date  . ABDOMINAL HYSTERECTOMY     partial   . CHOLECYSTECTOMY    . DENTAL SURGERY    . PANCREATICODUODENECTOMY      OB History    No data available       Home Medications    Prior to Admission medications   Medication Sig Start Date End Date Taking? Authorizing Provider  Aspirin-Acetaminophen-Caffeine (GOODY HEADACHE PO) Take 1 packet by mouth daily as needed (headache).    Yes [provider]  Multiple Vitamins-Minerals (ADULT GUMMY) CHEW Chew 2 Units by mouth daily.   Yes [provider]  amoxicillin (AMOXIL) 500 MG capsule Take 2 capsules (1,000 mg total) by mouth 2 (two) times daily. Patient not taking: Reported on 10/16/2016 03/21/16   Hayden RasmussenMabe, David, NP  busPIRone (BUSPAR) 10 MG tablet Take 10 mg by mouth 2 (two) times daily. 09/20/16   [provider]  dicyclomine (BENTYL) 20 MG tablet Take 1 tablet (20 mg total) by mouth 2 (two) times daily as needed for spasms (abdominal pain). 10/16/16   Huan Pollok,  Denny Peon, MD  DILANTIN 100 MG ER capsule Take 200 mg by mouth at bedtime. 09/20/16   [provider]  gabapentin (NEURONTIN) 300 MG capsule Take 2 capsules (600 mg total) by mouth 3 (three) times daily with meals. For agitation 10/16/16 10/23/16  Alvira Monday, MD  hydrOXYzine (VISTARIL) 50 MG capsule Take 50 mg by mouth 3 (three) times daily as needed. 09/20/16   [provider]  meclizine (ANTIVERT) 25 MG tablet Take 1 tablet (25 mg total) by mouth 3 (three) times daily as needed for dizziness. Patient not  taking: Reported on 10/16/2016 04/10/16   Palma Holter, MD  ondansetron (ZOFRAN ODT) 4 MG disintegrating tablet Take 1 tablet (4 mg total) by mouth every 8 (eight) hours as needed for nausea or vomiting. 10/16/16   Alvira Monday, MD  pantoprazole (PROTONIX) 20 MG tablet Take 1 tablet (20 mg total) by mouth 2 (two) times daily. 10/16/16 10/30/16  Alvira Monday, MD  penicillin v potassium (VEETID) 500 MG tablet Take 1 tablet (500 mg total) by mouth 4 (four) times daily. 10/16/16 10/23/16  Alvira Monday, MD  phenytoin (DILANTIN) 200 MG ER capsule Take 1 capsule (200 mg total) by mouth 2 (two) times daily. For seizure disorder Patient not taking: Reported on 10/16/2016 09/19/15   Armandina Stammer I, NP  QUEtiapine (SEROQUEL) 100 MG tablet Take 100 mg by mouth 3 (three) times daily. 0800, 1200, 1700. 09/20/16   [provider]  QUEtiapine (SEROQUEL) 200 MG tablet Take 1 tablet (200 mg total) by mouth at bedtime. For mood control Patient not taking: Reported on 10/16/2016 09/19/15   Armandina Stammer I, NP  QUEtiapine (SEROQUEL) 25 MG tablet Take 1 tablet (25 mg total) by mouth 2 (two) times daily. For agitation Patient not taking: Reported on 10/16/2016 09/19/15   Armandina Stammer I, NP  sertraline (ZOLOFT) 100 MG tablet Take 100 mg by mouth daily. 09/20/16   [provider]    Family History Family History  Problem Relation Age of Onset  . Cancer Mother   . Cancer Father     Social History Social History  Substance Use Topics  . Smoking status: Current Some Day Smoker    Packs/day: 1.00    Years: 15.00    Types: Cigarettes  . Smokeless tobacco: Never Used  . Alcohol use No     Allergies   Demerol and Morphine and related   Review of Systems Review of Systems  Constitutional: Positive for chills. Negative for fever.  HENT: Negative for sore throat.   Eyes: Negative for visual disturbance.  Respiratory: Positive for shortness of breath. Negative for cough.   Cardiovascular: Positive  for chest pain.  Gastrointestinal: Positive for abdominal pain, nausea and vomiting. Negative for constipation and diarrhea.  Genitourinary: Negative for difficulty urinating and dysuria.  Musculoskeletal: Negative for back pain and neck pain.  Skin: Negative for rash.  Neurological: Negative for syncope and headaches.     Physical Exam Updated Vital Signs BP 116/73   Pulse (!) 50   Temp 98.5 F (36.9 C) (Oral)   Resp 18   Ht 5\' 4"  (1.626 m)   Wt 68 kg (150 lb)   SpO2 94%   BMI 25.75 kg/m   Physical Exam  Constitutional: She is oriented to person, place, and time. She appears well-developed and well-nourished. No distress.  HENT:  Head: Normocephalic and atraumatic.  Eyes: Conjunctivae and EOM are normal.  Neck: Normal range of motion.  Cardiovascular: Normal rate, regular rhythm,  normal heart sounds and intact distal pulses.  Exam reveals no gallop and no friction rub.   No murmur heard. Pulmonary/Chest: Effort normal and breath sounds normal. No respiratory distress. She has no wheezes. She has no rales.  Abdominal: Soft. She exhibits no distension. There is tenderness in the right upper quadrant and epigastric area. There is CVA tenderness (mild right) and positive Murphy's sign. There is no guarding.  Musculoskeletal: She exhibits no edema or tenderness.  Neurological: She is alert and oriented to person, place, and time.  Skin: Skin is warm and dry. No rash noted. She is not diaphoretic. No erythema.  Psychiatric:  Tiny papules scattered over lower extremities, pustules    Nursing note and vitals reviewed.    ED Treatments / Results  Labs (all labs ordered are listed, but only abnormal results are displayed) Labs Reviewed  COMPREHENSIVE METABOLIC PANEL - Abnormal; Notable for the following:       Result Value   CO2 21 (*)    Calcium 8.7 (*)    Total Protein 5.6 (*)    Albumin 3.4 (*)    All other components within normal limits  LIPASE, BLOOD - Abnormal;  Notable for the following:    Lipase <10 (*)    All other components within normal limits  URINALYSIS, ROUTINE W REFLEX MICROSCOPIC - Abnormal; Notable for the following:    APPearance CLOUDY (*)    Specific Gravity, Urine 1.033 (*)    Ketones, ur 80 (*)    Protein, ur 30 (*)    Bacteria, UA RARE (*)    Squamous Epithelial / LPF 6-30 (*)    All other components within normal limits  CBC  I-STAT TROPONIN, ED  POC URINE PREG, ED    EKG  EKG Interpretation  Date/Time:  Wednesday October 16 2016 15:58:00 EDT Ventricular Rate:  59 PR Interval:    QRS Duration: 96 QT Interval:  427 QTC Calculation: 423 R Axis:   53 Text Interpretation:  Sinus rhythm Short PR interval Confirmed by Geoffery Lyons ([id number]) on 10/16/2016 4:07:38 PM       Radiology Dg Chest 2 View  Result Date: 10/16/2016 CLINICAL DATA:  Chest pain EXAM: CHEST  2 VIEW COMPARISON:  November 24, 2015 and March 25, 2012 FINDINGS: There is no appreciable edema or consolidation. There is slight central peribronchial thickening. Heart size and pulmonary vascularity are normal. No adenopathy. There is slight anterior wedging of a lower thoracic vertebral body, stable. IMPRESSION: Chronic bronchitis centrally. No edema or consolidation. Cardiac silhouette within normal limits. Electronically Signed   By: Bretta Bang III M.D.   On: 10/16/2016 17:01   Ct Renal Stone Study  Result Date: 10/16/2016 CLINICAL DATA:  41 year old female with a history of generalized body pain EXAM: CT ABDOMEN AND PELVIS WITHOUT CONTRAST TECHNIQUE: Multidetector CT imaging of the abdomen and pelvis was performed following the standard protocol without IV contrast. COMPARISON:  11/24/2015 FINDINGS: Lower chest: Centrilobular emphysema of the right lower lobe with mild bronchial wall thickening. No acute finding of the lower chest. Calcified granuloma of the left lower lobe. Hepatobiliary: Unremarkable appearance of liver parenchyma. Cholecystectomy. No  intrahepatic or extrahepatic biliary ductal dilatation. Pancreas: Coarse calcifications in the body and tail the pancreas, unchanged from prior possible surgical changes of prior Whipple. Surgical suture line adjacent to the lesser curvature of the stomach. Spleen: Calcifications of splenic parenchyma. Adrenals/Urinary Tract: Unremarkable appearance of the adrenal glands. No evidence of left or right hydronephrosis. No nephrolithiasis. Unremarkable  course the bilateral ureters. Unremarkable urinary bladder. Stomach/Bowel: Surgical changes along the lesser curvature of the stomach. Surgical changes of prior pancreatic could duodenectomy compatible with the given history. Normal appendix. Unremarkable appearance of the proximal colon. Minimal diverticular disease without associated inflammatory changes. Unremarkable appearance of the small bowel without transition point or obstruction. Vascular/Lymphatic: Minimal calcifications of the abdominal aorta. Reproductive: Unremarkable appearance of uterus. Low-density cystic structure associated with the right ovary measures less than 3 cm, likely physiologic. Other: Small subxiphoid ventral wall hernia containing small amount of mesenteric fat. No associated inflammatory changes. No inguinal lymphadenopathy. Musculoskeletal: No acute displaced fracture. Unchanged configuration of the T11 vertebral body. No bony canal narrowing. No degenerative changes of the hips. IMPRESSION: No acute CT finding. Surgical changes of prior pancreaticoduodenectomy. Coarsened calcifications of the distal pancreas, likely secondary to prior episodes of pancreatitis. Cholecystectomy. Physiologic changes of the adnexa. Electronically Signed   By: Gilmer Mor D.O.   On: 10/16/2016 18:51    Procedures Procedures (including critical care time)  Medications Ordered in ED Medications  sodium chloride 0.9 % bolus 1,000 mL (1,000 mLs Intravenous New Bag/Given 10/16/16 1706)  haloperidol lactate  (HALDOL) injection 5 mg (5 mg Intravenous Given 10/16/16 1706)  ketorolac (TORADOL) 30 MG/ML injection 30 mg (30 mg Intravenous Given 10/16/16 1821)  ondansetron (ZOFRAN) injection 4 mg (4 mg Intravenous Given 10/16/16 1821)  gabapentin (NEURONTIN) capsule 300 mg (300 mg Oral Given 10/16/16 1821)  hydrOXYzine (ATARAX/VISTARIL) tablet 25 mg (25 mg Oral Given 10/16/16 2012)     Initial Impression / Assessment and Plan / ED Course  I have reviewed the triage vital signs and the nursing notes.  Pertinent labs & imaging results that were available during my care of the patient were reviewed by me and considered in my medical decision making (see chart for details).     41 year old female with history of bipolar, depression, epilepsy, pancreatitis, prior alcohol-induced mood disorder, presents with concern for abdominal pain, chest pain, shortness of breath, dental pain and skin lesions.  Regarding skin lesions, discussed that these are most consistent with a folliculitis, and recommend follow-up with her primary care physician.  Regarding dental pain, patient has no sign of drainable abscess, no sign of Ludwig's angina, no trismus, and is appropriate for outpatient and about X and dentistry follow-up. Regarding patient's abdominal pain, nausea and vomiting. Labs obtained show no acute abnormalities. Patient was given normal saline and Haldol without significant improvement in symptoms.  Regarding sensation of shortness of breath and chest pain-this is not her largest concern today, and in setting of more significant emesis/abdominal pain, overall have low suspicion for acute intrathoracic pathology.  She is PERC negative, chest XR shows no sign of pneumonia/pneumothorax/CHF. Troponin negative and EKG without acute findings.  Symptoms may be secondary to esophagitis from emesis, anxiety or other, and recommend follow up with PCP.  On reevaluation, patient reports some improvement, however continuing nausea and  discomfort following haldol.  Now reports she has not had gabapentin for last 2 days and believes this may be withdrawal. Reports withdrawal from opiates in the past after a surgery and feels this is similar.  Patient with continuing right flank and RUQ pain. Will order CT stone study to eval common bile duct as well as look for nephrolithiasis. Given toradol and zofran.   Patient able to tolerate po. Given vistaril for anxiety. CT stone study within normal limits. Pregnancy test negative. Urinalysis shows no infection, does show dehydration.  Suspect viral etiology  of symptoms. She is out of gabapentin and was given 1 week rx. Given rx for symptom relief including zofran, bentyl, protonix and given penicillin for suspected periapical abscess.    Final Clinical Impressions(s) / ED Diagnoses   Final diagnoses:  Pain, dental  Nausea and vomiting, intractability of vomiting not specified, unspecified vomiting type  Chest pain, unspecified type  Esophagitis    New Prescriptions New Prescriptions   DICYCLOMINE (BENTYL) 20 MG TABLET    Take 1 tablet (20 mg total) by mouth 2 (two) times daily as needed for spasms (abdominal pain).   ONDANSETRON (ZOFRAN ODT) 4 MG DISINTEGRATING TABLET    Take 1 tablet (4 mg total) by mouth every 8 (eight) hours as needed for nausea or vomiting.   PANTOPRAZOLE (PROTONIX) 20 MG TABLET    Take 1 tablet (20 mg total) by mouth 2 (two) times daily.   PENICILLIN V POTASSIUM (VEETID) 500 MG TABLET    Take 1 tablet (500 mg total) by mouth 4 (four) times daily.     Alvira MondaySchlossman, Geneieve Duell, MD 10/16/16 2046

## 2016-10-16 NOTE — ED Triage Notes (Addendum)
Pt from home via EMS.  Pt sitting on curb w/Fire Dept having a panic attack when EMS arrived.  Reports gen body pain all month, skin lesions all over body.  Per EMS multiple past IV marks on bilat arms, pt reports frequent plasma donations.  Reports N/V today, gagging but not emesis w/EMS.  Pt reports taking excessive Goody powders and R lower molar pain. Pt would not let EMS place IV x2, screamed and pulled away. VS: 114/60, 62 bpm NSR, CBG 112 Pt reports CP w/RN.

## 2017-01-04 ENCOUNTER — Encounter (HOSPITAL_COMMUNITY): Payer: Self-pay | Admitting: Emergency Medicine

## 2017-01-04 ENCOUNTER — Emergency Department (HOSPITAL_COMMUNITY)
Admission: EM | Admit: 2017-01-04 | Discharge: 2017-01-04 | Disposition: A | Discharge status: Home / Self Care | Payer: Self-pay | Attending: Emergency Medicine | Admitting: Emergency Medicine

## 2017-01-04 ENCOUNTER — Ambulatory Visit (HOSPITAL_COMMUNITY)
Admission: EM | Admit: 2017-01-04 | Discharge: 2017-01-04 | Disposition: A | Discharge status: Home / Self Care | Payer: Self-pay | Attending: Family Medicine | Admitting: Family Medicine

## 2017-01-04 DIAGNOSIS — K0889 Other specified disorders of teeth and supporting structures: Secondary | ICD-10-CM | POA: Insufficient documentation

## 2017-01-04 DIAGNOSIS — K047 Periapical abscess without sinus: Secondary | ICD-10-CM | POA: Insufficient documentation

## 2017-01-04 DIAGNOSIS — Z5321 Procedure and treatment not carried out due to patient leaving prior to being seen by health care provider: Secondary | ICD-10-CM | POA: Insufficient documentation

## 2017-01-04 MED ORDER — HYDROCODONE-ACETAMINOPHEN 5-325 MG PO TABS: 1.0000 | ORAL_TABLET | Freq: Four times a day (QID) | ORAL | 0 refills | Status: DC | PRN

## 2017-01-04 MED ORDER — CLINDAMYCIN HCL 300 MG PO CAPS: 300.0000 mg | ORAL_CAPSULE | Freq: Three times a day (TID) | ORAL | 0 refills | Status: DC

## 2017-01-04 NOTE — ED Notes (Signed)
Called pt to bring back to room, pt did not answer.  

## 2017-01-04 NOTE — ED Triage Notes (Signed)
Pt here for right lower dental pain onset 1 week..... Has been taking left over clindamycin from friend  Also c/o rash above left eye brow onset 1 week  A&O x4... NAD... Ambulatory

## 2017-01-04 NOTE — ED Notes (Signed)
Pt was seen walking out of lobby taking off BP cuff. Pt walked out to the parking lot.

## 2017-01-04 NOTE — ED Provider Notes (Signed)
  [facility]   [id number] 01/04/17 Arrival Time: 1209  ASSESSMENT & PLAN:  1. Pain, dental    Meds ordered this encounter  Medications  . clindamycin (CLEOCIN) 300 MG capsule    Sig: Take 1 capsule (300 mg total) by mouth 3 (three) times daily.    Dispense:  30 capsule    Refill:  0  . HYDROcodone-acetaminophen (NORCO/VICODIN) 5-325 MG tablet    Sig: Take 1 tablet by mouth every 6 (six) hours as needed for moderate pain or severe pain.    Dispense:  6 tablet    Refill:  0   [place] Controlled Substances Registry consulted for this patient. I feel the risk/benefit ratio today is favorable for proceeding with this prescription for a controlled substance. Medication sedation precautions given.  Dental resource written instructions given. She will schedule dental evaluation as soon as possible.  Reviewed expectations re: course of current medical issues. Questions answered. Outlined signs and symptoms indicating need for more acute intervention. Patient verbalized understanding. After Visit Summary given.   SUBJECTIVE:   Katherine Bond is a 41 y.o. female who reports abrupt onset of right lower dental pain. Present for 2-3 days. Started Clindamycin (for 2 days) given to her by a friend. Swelling has decreased. OTC analgesics without much relief of pain. Trouble sleeping secondary to pain. Tolerating PO intake. Afebrile. Does not see a dentist regularly.  ROS: As per HPI.  OBJECTIVE:  Vitals:   01/04/17 1247  BP: (!) 153/100  Pulse: 76  Resp: 20  Temp: 97.8 F (36.6 C)  TempSrc: Oral  SpO2: 100%    General appearance: alert; no distress HENT: normocephalic; atraumatic; dentition: fair; gingival hypertrophy of R lower gum; no fluctuance Neck: supple without LAD Lungs: normal respirations Skin: warm and dry Psychological: alert and cooperative; normal mood and affect   Allergies  Allergen Reactions  . Demerol Anaphylaxis and Rash  . Morphine And Related  Anaphylaxis and Rash    Past Medical History:  Diagnosis Date  . Bipolar 1 disorder (HCC)   . Depression   . Epilepsy (HCC)   . Pancreatitis    Social History   Social History  . Marital status: Divorced    Spouse name: N/A  . Number of children: N/A  . Years of education: N/A   Occupational History  . Not on file.   Social History Main Topics  . Smoking status: Current Some Day Smoker    Packs/day: 1.00    Years: 15.00    Types: Cigarettes  . Smokeless tobacco: Never Used  . Alcohol use No  . Drug use: No  . Sexual activity: Yes   Other Topics Concern  . Not on file   Social History Narrative  . No narrative on file   Family History  Problem Relation Age of Onset  . Cancer Mother   . Cancer Father    Past Surgical History:  Procedure Laterality Date  . ABDOMINAL HYSTERECTOMY     partial   . CHOLECYSTECTOMY    . DENTAL SURGERY    . Sharee PimplePANCREATICODUODENECTOMY       Demari Kropp, MD 01/04/17 1302

## 2017-01-04 NOTE — ED Triage Notes (Signed)
Pt. Stated, I have a bad tooth for awhile and a bad place over my left eye that's might be coming from the bad tooth.

## 2017-01-04 NOTE — ED Notes (Signed)
[facility]

## 2017-01-26 LAB — GLUCOSE, POCT (MANUAL RESULT ENTRY): POC GLUCOSE: 137 mg/dL — AB (ref 70–99)

## 2020-07-13 ENCOUNTER — Emergency Department (HOSPITAL_COMMUNITY): Payer: 59

## 2020-07-13 ENCOUNTER — Encounter (HOSPITAL_COMMUNITY): Payer: Self-pay

## 2020-07-13 ENCOUNTER — Emergency Department (HOSPITAL_COMMUNITY)
Admission: EM | Admit: 2020-07-13 | Discharge: 2020-07-13 | Disposition: A | Discharge status: Home / Self Care | Payer: 59 | Attending: Emergency Medicine | Admitting: Emergency Medicine

## 2020-07-13 DIAGNOSIS — R1084 Generalized abdominal pain: Secondary | ICD-10-CM

## 2020-07-13 DIAGNOSIS — Z7982 Long term (current) use of aspirin: Secondary | ICD-10-CM | POA: Insufficient documentation

## 2020-07-13 DIAGNOSIS — F17210 Nicotine dependence, cigarettes, uncomplicated: Secondary | ICD-10-CM | POA: Insufficient documentation

## 2020-07-13 DIAGNOSIS — R1011 Right upper quadrant pain: Secondary | ICD-10-CM | POA: Insufficient documentation

## 2020-07-13 DIAGNOSIS — R112 Nausea with vomiting, unspecified: Secondary | ICD-10-CM | POA: Diagnosis not present

## 2020-07-13 DIAGNOSIS — R197 Diarrhea, unspecified: Secondary | ICD-10-CM | POA: Insufficient documentation

## 2020-07-13 LAB — URINALYSIS, ROUTINE W REFLEX MICROSCOPIC
Bacteria, UA: NONE SEEN
Bilirubin Urine: NEGATIVE
Glucose, UA: NEGATIVE mg/dL
Hgb urine dipstick: NEGATIVE
Ketones, ur: 20 mg/dL — AB
Leukocytes,Ua: NEGATIVE
Nitrite: NEGATIVE
Protein, ur: 30 mg/dL — AB
Specific Gravity, Urine: 1.027 (ref 1.005–1.030)
pH: 5 (ref 5.0–8.0)

## 2020-07-13 LAB — CBC WITH DIFFERENTIAL/PLATELET
Abs Immature Granulocytes: 0.07 10*3/uL (ref 0.00–0.07)
Basophils Absolute: 0 10*3/uL (ref 0.0–0.1)
Basophils Relative: 0 %
Eosinophils Absolute: 0 10*3/uL (ref 0.0–0.5)
Eosinophils Relative: 0 %
HCT: 44.8 % (ref 36.0–46.0)
Hemoglobin: 15.2 g/dL — ABNORMAL HIGH (ref 12.0–15.0)
Immature Granulocytes: 0 %
Lymphocytes Relative: 8 %
Lymphs Abs: 1.3 10*3/uL (ref 0.7–4.0)
MCH: 29.7 pg (ref 26.0–34.0)
MCHC: 33.9 g/dL (ref 30.0–36.0)
MCV: 87.5 fL (ref 80.0–100.0)
Monocytes Absolute: 0.5 10*3/uL (ref 0.1–1.0)
Monocytes Relative: 3 %
Neutro Abs: 14.1 10*3/uL — ABNORMAL HIGH (ref 1.7–7.7)
Neutrophils Relative %: 89 %
Platelets: 260 10*3/uL (ref 150–400)
RBC: 5.12 MIL/uL — ABNORMAL HIGH (ref 3.87–5.11)
RDW: 13.2 % (ref 11.5–15.5)
WBC: 16 10*3/uL — ABNORMAL HIGH (ref 4.0–10.5)
nRBC: 0 % (ref 0.0–0.2)

## 2020-07-13 LAB — RAPID URINE DRUG SCREEN, HOSP PERFORMED
Amphetamines: NOT DETECTED
Barbiturates: NOT DETECTED
Benzodiazepines: NOT DETECTED
Cocaine: NOT DETECTED
Opiates: NOT DETECTED
Tetrahydrocannabinol: NOT DETECTED

## 2020-07-13 LAB — COMPREHENSIVE METABOLIC PANEL
ALT: 40 U/L (ref 0–44)
AST: 24 U/L (ref 15–41)
Albumin: 4.3 g/dL (ref 3.5–5.0)
Alkaline Phosphatase: 138 U/L — ABNORMAL HIGH (ref 38–126)
Anion gap: 9 (ref 5–15)
BUN: 11 mg/dL (ref 6–20)
CO2: 25 mmol/L (ref 22–32)
Calcium: 9.6 mg/dL (ref 8.9–10.3)
Chloride: 108 mmol/L (ref 98–111)
Creatinine, Ser: 0.66 mg/dL (ref 0.44–1.00)
GFR, Estimated: >60 mL/min (ref >60)
Glucose, Bld: 149 mg/dL — ABNORMAL HIGH (ref 70–99)
Potassium: 3.4 mmol/L — ABNORMAL LOW (ref 3.5–5.1)
Sodium: 142 mmol/L (ref 135–145)
Total Bilirubin: 0.6 mg/dL (ref 0.3–1.2)
Total Protein: 7.2 g/dL (ref 6.5–8.1)

## 2020-07-13 LAB — LIPASE, BLOOD: Lipase: 19 U/L (ref 11–51)

## 2020-07-13 LAB — ETHANOL: Alcohol, Ethyl (B): <10 mg/dL (ref <10)

## 2020-07-13 MED ORDER — ONDANSETRON 4 MG PO TBDP: 4.0000 mg | ORAL_TABLET | Freq: Three times a day (TID) | ORAL | 0 refills | Status: AC | PRN

## 2020-07-13 MED ORDER — KETOROLAC TROMETHAMINE 15 MG/ML IJ SOLN: 15.0000 mg | Freq: Once | INTRAMUSCULAR | Status: DC

## 2020-07-13 MED ORDER — PANTOPRAZOLE SODIUM 40 MG PO TBEC: 40.0000 mg | DELAYED_RELEASE_TABLET | Freq: Every day | ORAL | 0 refills | Status: AC

## 2020-07-13 MED ORDER — IOHEXOL 300 MG/ML  SOLN
100.0000 mL | Freq: Once | INTRAMUSCULAR | Status: AC | PRN
  Administered 2020-07-13: 100 mL via INTRAVENOUS

## 2020-07-13 MED ORDER — GABAPENTIN 300 MG PO CAPS
300.0000 mg | ORAL_CAPSULE | Freq: Once | ORAL | Status: AC
  Administered 2020-07-13: 300 mg via ORAL
  Filled 2020-07-13: qty 1

## 2020-07-13 MED ORDER — LORAZEPAM 2 MG/ML IJ SOLN
1.0000 mg | Freq: Once | INTRAMUSCULAR | Status: AC
Start: 2020-07-13 — End: 2020-07-13
  Administered 2020-07-13: 1 mg via INTRAVENOUS
  Filled 2020-07-13: qty 1

## 2020-07-13 MED ORDER — LACTATED RINGERS IV BOLUS
1000.0000 mL | Freq: Once | INTRAVENOUS | Status: AC
  Administered 2020-07-13: 1000 mL via INTRAVENOUS

## 2020-07-13 MED ORDER — ONDANSETRON HCL 4 MG/2ML IJ SOLN
4.0000 mg | Freq: Once | INTRAMUSCULAR | Status: AC
Start: 2020-07-13 — End: 2020-07-13
  Administered 2020-07-13: 4 mg via INTRAVENOUS
  Filled 2020-07-13: qty 2

## 2020-07-13 MED ORDER — KETOROLAC TROMETHAMINE 15 MG/ML IJ SOLN
15.0000 mg | Freq: Once | INTRAMUSCULAR | Status: AC
  Administered 2020-07-13: 15 mg via INTRAMUSCULAR
  Filled 2020-07-13: qty 1

## 2020-07-13 MED ORDER — PANTOPRAZOLE SODIUM 40 MG IV SOLR
40.0000 mg | Freq: Once | INTRAVENOUS | Status: AC
Start: 2020-07-13 — End: 2020-07-13
  Administered 2020-07-13: 40 mg via INTRAVENOUS
  Filled 2020-07-13: qty 40

## 2020-07-13 MED ORDER — LORAZEPAM 2 MG/ML IJ SOLN
1.0000 mg | Freq: Once | INTRAMUSCULAR | Status: DC
Start: 2020-07-13 — End: 2020-07-13

## 2020-07-13 MED ORDER — LORAZEPAM 1 MG PO TABS
1.0000 mg | ORAL_TABLET | Freq: Once | ORAL | Status: AC
  Administered 2020-07-13: 1 mg via ORAL
  Filled 2020-07-13: qty 1

## 2020-07-13 MED ORDER — ALBUTEROL SULFATE HFA 108 (90 BASE) MCG/ACT IN AERS
2.0000 | INHALATION_SPRAY | Freq: Once | RESPIRATORY_TRACT | Status: AC
Start: 2020-07-13 — End: 2020-07-13
  Administered 2020-07-13: 2 via RESPIRATORY_TRACT
  Filled 2020-07-13: qty 6.7

## 2020-07-13 MED ORDER — ONDANSETRON 4 MG PO TBDP
4.0000 mg | ORAL_TABLET | Freq: Once | ORAL | Status: AC
  Administered 2020-07-13: 4 mg via ORAL
  Filled 2020-07-13: qty 1

## 2020-07-13 MED ORDER — ONDANSETRON HCL 4 MG/2ML IJ SOLN
4.0000 mg | Freq: Once | INTRAMUSCULAR | Status: AC
  Administered 2020-07-13: 4 mg via INTRAVENOUS
  Filled 2020-07-13: qty 2

## 2020-07-13 NOTE — ED Notes (Signed)
This nurse entered the pt's room to re-establish peripheral IV access. This nurse attempted to establish peripheral 22g IV access. The pt would not hold her left arm still, despite explaining to the pt that she would need to keep her arm still during the IV attempt. This nurse discontinued IV attempt d/t pt's inability to cooperate and stay still and explained to the pt that IV acces would need to be re-established. The pt then stated to this nurse "this is fucked up and the only reason you are digging in my arm is because you are mad that I pulled out the first IV," which was a 20g peripheral IV to the pt's right bicep. This nurse assured the pt that was not case. The above information was relayed to [employer].

## 2020-07-13 NOTE — ED Notes (Signed)
This nurse entered the pt's room after she called out and stated, "my IV fell out." There was dried blood noted to the pt's right AC and right hand and pt's right AC 20g peripheral IV was noted to no longer be intact.

## 2020-07-13 NOTE — ED Notes (Signed)
Went into patients room and let her know we need a urine sample. She was moving but would not open her eyes. I asked her if she could hear me and let her know again we needed a urine sample. She again did not respond but moved her legs.

## 2020-07-13 NOTE — ED Provider Notes (Signed)
  Physical Exam  BP 122/75   Pulse 64   Temp 98.1 F (36.7 C) (Oral)   Resp (!) 21   Ht 5\' 4"  (1.626 m)   Wt 65.8 kg   SpO2 96%   BMI 24.89 kg/m   Physical Exam  ED Course/Procedures   Clinical Course as of 07/14/20 0012  Thu Jul 13, 2020  1605 Patient reevaluated complaining of pain in her right lower chest at this time, endorses cough x2 weeks.  There is rhonchi audible in the right lower lung field.  Will proceed with chest x-ray. [RS]    Clinical Course User Index [RS] Allien Melberg, Jul 15, 2020, PA-C    Procedures  MDM   Care of this patient assumed from preceding ED provider, Eugene Gavia, PA-C at time of shift change.  Please see associated note for further details of patient's ED course.  In brief patient with history of pancreatitis presents today with 2 days of nausea, vomiting, and upper abdominal pain.  Unable to tolerate p.o. including gabapentin which she reports she takes for seizures.  Of note patient has anaphylactic reaction to Demerol and morphine; was administered Zofran and Ativan in the ED with improvement in her symptoms.  CBC with leukocytosis of 16,000, CMP unremarkable, lipase is normal.  UA pending, at time of shift change patient is awaiting CT abdomen pelvis for further evaluation of her abdominal pain.  If normal will likely able to be discharged home.  CT scan revealed surgical changes from pancreaticoduodenectomy, negative for acute abdominopelvic abnormality.  Patient reevaluated, endorses worsening of her nausea again. Will administer dose of Zofran and Protonix as patient's symptoms are likely secondary to gastritis. Chest x-ray with reactive airway disease in the right lower lobe> left lower lobe.   Patient continues have discomfort and was administered Toradol and Ativan with improvement in her symptoms.  No further work-up warranted in the ED at this time.  While exact source of patient's discomfort remains unclear, there does not appear to be any  emergent issue.  Will provide gastroenterology contact information for follow-up.  Will provide outpatient Zofran prescription.  Please have voiced understanding of her medical evaluation and treatment plan.  Each of her questions was answered to her expressed satisfaction.  Return precautions given.  Patient is stable and appropriate for discharge at this time.  This chart was dictated using voice recognition software, Dragon. Despite the best efforts of this provider to proofread and correct errors, errors may still occur which can change documentation meaning.      Harolyn Rutherford, PA-C 07/14/20 0014    07/16/20, MD 07/18/20 1021

## 2020-07-13 NOTE — ED Notes (Signed)
Xray at the bedside.

## 2020-07-13 NOTE — ED Notes (Signed)
PA-C Haze Rushing back at bedside to re-assess.

## 2020-07-13 NOTE — ED Notes (Signed)
PA-C Haze Rushing at the bedside to re-evaluate.

## 2020-07-13 NOTE — ED Notes (Addendum)
This nurse entered the pt's room to find pt initially calm, however, pt began a writhing around on stretcher wailing out in pain. This nurse asked the pt if she thought she would be able to tolerate the ordered inhaler. Initially the pt declined, however pt was then able to use inhaler without incident. Pt po challenging at this time, per PA-C Rebecca Sponseller's verbal order. PA-C Haze Rushing notified and aware of the above. Pt's VSS at this time.

## 2020-07-13 NOTE — Discharge Instructions (Signed)
You were seen in the ER today for your abdominal pain.  Your blood work, vital signs, and CT scan were very reassuring. While the exact cause of your symptoms remains unclear, there is not appear to be any emergent source.  Suspect there could be inflammation of your stomach contributing to your pain, called gastritis.  You have been prescribed medication called pantoprazole to take daily for this. Additionally you have been prescribed a nausea medication to take as needed.  Below is the contact information for [facility] gastroenterology with whom you should follow-up early next week.  Return to the ER if you develop any nausea or vomiting does not stop, or any other new severe symptoms.

## 2020-07-13 NOTE — ED Triage Notes (Signed)
Pt presents via EMS for abd pain which she states began last night at 8:00pm. She reports accompanying sx of N/V. Pt reports a hx of seizures and states she has been unable to keep her seizure medications down d/t vomiting. She denies any EtOH use or illicit drug use. She reports taking Gabapentin for her seizures.

## 2020-07-13 NOTE — ED Provider Notes (Signed)
[facility]-EMERGENCY DEPT Provider Note   CSN: [id number] Arrival date & time: 07/13/20  1228     History Chief Complaint  Patient presents with  . Abdominal Pain    N/v since 8:00 pm on 07/12/20.    Katherine Bond is a 45 y.o. female.  HPI      Katherine BrideLisa Bond is a 45 y.o. female, with a history of bipolar, depression, epilepsy, pancreatitis, Whipple, past alcohol abuse, presenting to the ED with abdominal pain for the last 2 to 3 days. Pain seems to be centered in the epigastric region, constant, feels like "an explosion," radiating to the right upper abdomen, 10/10. Accompanied by nausea, nonbloody vomiting, diarrhea.  Symptoms feel similar to previous instances of pancreatitis. States she has not consumed alcohol for 3 years. She also states she thinks she has been having seizures because she has been unable to take her seizure medication.  She takes Neurontin for her seizures and was last able to keep this medication down yesterday. Denies fever, hematochezia/melena, chest pain, shortness of breath, flank/back pain, urinary symptoms, or any other complaints.    Past Medical History:  Diagnosis Date  . Bipolar 1 disorder (HCC)   . Depression   . Epilepsy (HCC)   . Pancreatitis     Patient Active Problem List   Diagnosis Date Noted  . Alcohol abuse 09/27/2015  . 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 PTSD (post-traumatic stress disorder) 09/16/2015  . Panic disorder 11/05/2014  . Convulsion (HCC)   . MDD (major depressive disorder), recurrent severe, without psychosis (HCC)     Past Surgical History:  Procedure Laterality Date  . ABDOMINAL HYSTERECTOMY     partial   . CHOLECYSTECTOMY    . DENTAL SURGERY    . PANCREATICODUODENECTOMY       OB History   No obstetric history on file.     Family History  Problem Relation Age of Onset  . Cancer Mother   . Cancer Father     Social History   Tobacco Use  . Smoking status:  Current Some Day Smoker    Packs/day: 1.00    Years: 15.00    Pack years: 15.00    Types: Cigarettes  . Smokeless tobacco: Never Used  Vaping Use  . Vaping Use: Never used  Substance Use Topics  . Alcohol use: No  . Drug use: No    Home Medications Prior to Admission medications   Medication Sig Start Date End Date Taking? Authorizing Provider  amoxicillin (AMOXIL) 500 MG capsule Take 2 capsules (1,000 mg total) by mouth 2 (two) times daily. Patient not taking: Reported on 10/16/2016 03/21/16   Hayden RasmussenMabe, David, NP  Aspirin-Acetaminophen-Caffeine (GOODY HEADACHE PO) Take 1 packet by mouth daily as needed (headache).     [provider]  busPIRone (BUSPAR) 10 MG tablet Take 10 mg by mouth 2 (two) times daily. 09/20/16   [provider]  clindamycin (CLEOCIN) 300 MG capsule Take 1 capsule (300 mg total) by mouth 3 (three) times daily. 01/04/17   Mardella LaymanHagler, Brian, MD  dicyclomine (BENTYL) 20 MG tablet Take 1 tablet (20 mg total) by mouth 2 (two) times daily as needed for spasms (abdominal pain). 10/16/16   Alvira MondaySchlossman, Erin, MD  DILANTIN 100 MG ER capsule Take 200 mg by mouth at bedtime. 09/20/16   [provider]  gabapentin (NEURONTIN) 300 MG capsule Take 2 capsules (600 mg total) by mouth 3 (three) times daily with meals. For agitation 10/16/16 10/23/16  Alvira Monday, MD  HYDROcodone-acetaminophen (NORCO/VICODIN) 5-325 MG tablet Take 1 tablet by mouth every 6 (six) hours as needed for moderate pain or severe pain. 01/04/17   Mardella Layman, MD  hydrOXYzine (VISTARIL) 50 MG capsule Take 50 mg by mouth 3 (three) times daily as needed. 09/20/16   [provider]  meclizine (ANTIVERT) 25 MG tablet Take 1 tablet (25 mg total) by mouth 3 (three) times daily as needed for dizziness. Patient not taking: Reported on 10/16/2016 04/10/16   Palma Holter, MD  Multiple Vitamins-Minerals (ADULT GUMMY) CHEW Chew 2 Units by mouth daily.    [provider]  ondansetron (ZOFRAN  ODT) 4 MG disintegrating tablet Take 1 tablet (4 mg total) by mouth every 8 (eight) hours as needed for nausea or vomiting. 10/16/16   Alvira Monday, MD  pantoprazole (PROTONIX) 20 MG tablet Take 1 tablet (20 mg total) by mouth 2 (two) times daily. 10/16/16 10/30/16  Alvira Monday, MD  phenytoin (DILANTIN) 200 MG ER capsule Take 1 capsule (200 mg total) by mouth 2 (two) times daily. For seizure disorder Patient not taking: Reported on 10/16/2016 09/19/15   Armandina Stammer I, NP  QUEtiapine (SEROQUEL) 100 MG tablet Take 100 mg by mouth 3 (three) times daily. 0800, 1200, 1700. 09/20/16   [provider]  QUEtiapine (SEROQUEL) 200 MG tablet Take 1 tablet (200 mg total) by mouth at bedtime. For mood control Patient not taking: Reported on 10/16/2016 09/19/15   Armandina Stammer I, NP  QUEtiapine (SEROQUEL) 25 MG tablet Take 1 tablet (25 mg total) by mouth 2 (two) times daily. For agitation Patient not taking: Reported on 10/16/2016 09/19/15   Armandina Stammer I, NP  sertraline (ZOLOFT) 100 MG tablet Take 100 mg by mouth daily. 09/20/16   [provider]    Allergies    Demerol and Morphine and related  Review of Systems   Review of Systems  Constitutional: Negative for fever.  Respiratory: Negative for shortness of breath.   Cardiovascular: Negative for chest pain.  Gastrointestinal: Positive for abdominal pain, diarrhea, nausea and vomiting. Negative for blood in stool.  Genitourinary: Negative for dysuria, flank pain and hematuria.  Musculoskeletal: Negative for back pain.  Neurological: Negative for weakness and numbness.  All other systems reviewed and are negative.   Physical Exam Updated Vital Signs BP (!) 136/114 (BP Location: Left Arm)   Pulse 73   Temp 98.1 F (36.7 C) (Oral)   Ht 5\' 4"  (1.626 m)   Wt 65.8 kg   SpO2 99%   BMI 24.89 kg/m   Physical Exam Vitals and nursing note reviewed.  Constitutional:      General: She is in acute distress (pain).     Appearance: She is  well-developed. She is not diaphoretic.  HENT:     Head: Normocephalic and atraumatic.     Mouth/Throat:     Mouth: Mucous membranes are moist.     Pharynx: Oropharynx is clear.  Eyes:     Conjunctiva/sclera: Conjunctivae normal.  Cardiovascular:     Rate and Rhythm: Normal rate and regular rhythm.     Pulses: Normal pulses.          Radial pulses are 2+ on the right side and 2+ on the left side.       Posterior tibial pulses are 2+ on the right side and 2+ on the left side.     Heart sounds: Normal heart sounds.     Comments: Tactile temperature in the extremities  appropriate and equal bilaterally. Pulmonary:     Effort: Pulmonary effort is normal. No respiratory distress.     Breath sounds: Normal breath sounds.  Abdominal:     Palpations: Abdomen is soft.     Tenderness: There is abdominal tenderness in the right upper quadrant and epigastric area. There is no guarding.  Musculoskeletal:     Cervical back: Neck supple.     Right lower leg: No edema.     Left lower leg: No edema.  Lymphadenopathy:     Cervical: No cervical adenopathy.  Skin:    General: Skin is warm and dry.  Neurological:     Mental Status: She is alert.  Psychiatric:        Mood and Affect: Mood and affect normal.        Speech: Speech normal.        Behavior: Behavior normal.     ED Results / Procedures / Treatments   Labs (all labs ordered are listed, but only abnormal results are displayed) Labs Reviewed  CBC WITH DIFFERENTIAL/PLATELET - Abnormal; Notable for the following components:      Result Value   WBC 16.0 (*)    RBC 5.12 (*)    Hemoglobin 15.2 (*)    Neutro Abs 14.1 (*)    All other components within normal limits  URINALYSIS, ROUTINE W REFLEX MICROSCOPIC - Abnormal; Notable for the following components:   Ketones, ur 20 (*)    Protein, ur 30 (*)    All other components within normal limits  COMPREHENSIVE METABOLIC PANEL - Abnormal; Notable for the following components:   Potassium  3.4 (*)    Glucose, Bld 149 (*)    Alkaline Phosphatase 138 (*)    All other components within normal limits  LIPASE, BLOOD  ETHANOL  RAPID URINE DRUG SCREEN, HOSP PERFORMED    EKG None  Radiology No results found.  Procedures Procedures   Medications Ordered in ED Medications  ondansetron (ZOFRAN) injection 4 mg (4 mg Intravenous Given 07/13/20 1322)  LORazepam (ATIVAN) injection 1 mg (1 mg Intravenous Given 07/13/20 1322)  lactated ringers bolus 1,000 mL (1,000 mLs Intravenous New Bag/Given 07/13/20 1321)  iohexol (OMNIPAQUE) 300 MG/ML solution 100 mL (100 mLs Intravenous Contrast Given 07/13/20 1436)    ED Course  I have reviewed the triage vital signs and the nursing notes.  Pertinent labs & imaging results that were available during my care of the patient were reviewed by me and considered in my medical decision making (see chart for details).    MDM Rules/Calculators/A&P                          Patient presents with abdominal pain, nausea, vomiting, diarrhea. Patient is nontoxic appearing, afebrile, not tachycardic, not tachypneic, not hypotensive, maintains excellent SPO2 on room air.   I have reviewed the patient's chart to obtain more information.   I reviewed and interpreted the patient's labs and radiological studies. Leukocytosis.  CBC otherwise unremarkable. Mild hypokalemia, likely associated with patient's vomiting and diarrhea. Improved significantly with Zofran and Ativan.  End of shift patient care handoff report given to Loleta Dicker, PA-C. Plan: Follow-up CT scan.  Reevaluate patient.  Vitals:   07/13/20 1236 07/13/20 1237 07/13/20 1330 07/13/20 1415  BP: (!) 136/114  136/83 122/75  Pulse: 73  (!) 55 64  Resp:   17 (!) 21  Temp: 98.1 F (36.7 C)     TempSrc: Oral  SpO2: 99%  96% 96%  Weight:  65.8 kg    Height:  5\' 4"  (1.626 m)       Final Clinical Impression(s) / ED Diagnoses Final diagnoses:  None    Rx / DC Orders ED  Discharge Orders    None       07/13/20 1518    07/15/20, DO 07/13/20 1554

## 2020-10-11 ENCOUNTER — Emergency Department (HOSPITAL_COMMUNITY)
Admission: EM | Admit: 2020-10-11 | Discharge: 2020-10-11 | Discharge status: Against Medical Advice | Payer: 59 | Attending: Emergency Medicine | Admitting: Emergency Medicine

## 2020-10-11 ENCOUNTER — Encounter (HOSPITAL_COMMUNITY): Payer: Self-pay | Admitting: *Deleted

## 2020-10-11 DIAGNOSIS — R1084 Generalized abdominal pain: Secondary | ICD-10-CM | POA: Insufficient documentation

## 2020-10-11 DIAGNOSIS — R197 Diarrhea, unspecified: Secondary | ICD-10-CM | POA: Insufficient documentation

## 2020-10-11 DIAGNOSIS — R112 Nausea with vomiting, unspecified: Secondary | ICD-10-CM | POA: Insufficient documentation

## 2020-10-11 DIAGNOSIS — F17210 Nicotine dependence, cigarettes, uncomplicated: Secondary | ICD-10-CM | POA: Insufficient documentation

## 2020-10-11 DIAGNOSIS — R1010 Upper abdominal pain, unspecified: Secondary | ICD-10-CM | POA: Diagnosis present

## 2020-10-11 MED ORDER — ONDANSETRON 4 MG PO TBDP
4.0000 mg | ORAL_TABLET | Freq: Once | ORAL | Status: AC
  Administered 2020-10-11: 4 mg via ORAL
  Filled 2020-10-11: qty 1

## 2020-10-11 MED ORDER — SODIUM CHLORIDE 0.9 % IV BOLUS: 1000.0000 mL | Freq: Once | INTRAVENOUS | Status: DC

## 2020-10-11 NOTE — ED Provider Notes (Signed)
Emergency Medicine Provider Triage Evaluation Note  Katherine Bond , a 45 y.o. female  was evaluated in triage.  Pt complains of nausea, vomiting, abdominal pain since Monday. Upper abdomen, feels distended. No fevers. Does have diarrhea, no blood. Did drink alcohol yesterday, half a bottle of vodka.   Review of Systems  Positive: Abdominal pain, nausea, vomiting  Negative: fever  Physical Exam  BP (!) 160/103 (BP Location: Right Arm)   Pulse 78   Temp 98.5 F (36.9 C) (Oral)   Resp 18   SpO2 94%  Gen:   Awake, no distress   Resp:  Normal effort  MSK:   Moves extremities without difficulty  Other:  Generalized abdominal pain, no rebound   Medical Decision Making  Medically screening exam initiated at 5:08 PM.  Appropriate orders placed.  Jadaya Sommerfield was informed that the remainder of the evaluation will be completed by another provider, this initial triage assessment does not replace that evaluation, and the importance of remaining in the ED until their evaluation is complete.     Farrel Gordon, PA-C 10/11/20 1711    Rozelle Logan, DO 10/11/20 2302

## 2020-10-11 NOTE — ED Notes (Signed)
Pt refuse blood draw from writer request to wait for IV.

## 2020-10-11 NOTE — ED Notes (Addendum)
Unable to obtain labs/start IV, pt difficult stick.  Pt requesting for IV team.

## 2020-10-11 NOTE — ED Provider Notes (Signed)
[facility]-EMERGENCY DEPT Provider Note   CSN: [id number] Arrival date & time: 10/11/20  1618     History Chief Complaint  Patient presents with   Abdominal Pain    Katherine Bond is a 45 y.o. female.  45 year old female with history of bipolar disorder, depression, epilepsy, pancreatitis, with vomiting x 2 days, 8 episodes of non blood non bilious emesis today. Also upper abdominal pain, described as insides tight and about to explode, onset yesterday. Tried zofran at home which usually helps but has not helped today. Also loose stools with mucous, non bloody. No fevers, no sick contacts, no new foods, no changes to medications. Prior abdominal surgeries include cholecystectomy, abdominal hysterectomy, pancreaticoduodenectomy. Did drink alcohol last night, sober since 2014.       Past Medical History:  Diagnosis Date   Bipolar 1 disorder (HCC)    Depression    Epilepsy (HCC)    Pancreatitis     Patient Active Problem List   Diagnosis Date Noted   Alcohol abuse 09/27/2015   Alcohol-induced mood disorder (HCC) 09/27/2015   PTSD (post-traumatic stress disorder) 09/16/2015   Panic disorder 11/05/2014   Convulsion (HCC)    MDD (major depressive disorder), recurrent severe, without psychosis (HCC)     Past Surgical History:  Procedure Laterality Date   ABDOMINAL HYSTERECTOMY     partial    CHOLECYSTECTOMY     DENTAL SURGERY     PANCREATICODUODENECTOMY       OB History   No obstetric history on file.     Family History  Problem Relation Age of Onset   Cancer Mother    Cancer Father     Social History   Tobacco Use   Smoking status: Some Days    Packs/day: 1.00    Years: 15.00    Pack years: 15.00    Types: Cigarettes   Smokeless tobacco: Never  Vaping Use   Vaping Use: Never used  Substance Use Topics   Alcohol use: No   Drug use: No    Home Medications Prior to Admission medications   Medication Sig Start Date End Date Taking?  Authorizing Provider  amoxicillin (AMOXIL) 500 MG capsule Take 2 capsules (1,000 mg total) by mouth 2 (two) times daily. Patient not taking: Reported on 10/16/2016 03/21/16   Hayden Rasmussen, NP  Aspirin-Acetaminophen-Caffeine (GOODY HEADACHE PO) Take 1 packet by mouth daily as needed (headache).     [provider]  busPIRone (BUSPAR) 10 MG tablet Take 10 mg by mouth 2 (two) times daily. 09/20/16   [provider]  clindamycin (CLEOCIN) 300 MG capsule Take 1 capsule (300 mg total) by mouth 3 (three) times daily. 01/04/17   Mardella Layman, MD  dicyclomine (BENTYL) 20 MG tablet Take 1 tablet (20 mg total) by mouth 2 (two) times daily as needed for spasms (abdominal pain). 10/16/16   Alvira Monday, MD  DILANTIN 100 MG ER capsule Take 200 mg by mouth at bedtime. 09/20/16   [provider]  gabapentin (NEURONTIN) 300 MG capsule Take 2 capsules (600 mg total) by mouth 3 (three) times daily with meals. For agitation 10/16/16 10/23/16  Alvira Monday, MD  HYDROcodone-acetaminophen (NORCO/VICODIN) 5-325 MG tablet Take 1 tablet by mouth every 6 (six) hours as needed for moderate pain or severe pain. 01/04/17   Mardella Layman, MD  hydrOXYzine (VISTARIL) 50 MG capsule Take 50 mg by mouth 3 (three) times daily as needed. 09/20/16   [provider]  meclizine Lezlie Octave)  25 MG tablet Take 1 tablet (25 mg total) by mouth 3 (three) times daily as needed for dizziness. Patient not taking: Reported on 10/16/2016 04/10/16   Palma Holter, MD  Multiple Vitamins-Minerals (ADULT GUMMY) CHEW Chew 2 Units by mouth daily.    [provider]  ondansetron (ZOFRAN ODT) 4 MG disintegrating tablet Take 1 tablet (4 mg total) by mouth every 8 (eight) hours as needed for nausea or vomiting. 07/13/20   Sponseller, Lupe Carney R, PA-C  pantoprazole (PROTONIX) 40 MG tablet Take 1 tablet (40 mg total) by mouth daily. 07/13/20 08/12/20  Sponseller, Eugene Gavia, PA-C  phenytoin (DILANTIN) 200 MG ER capsule Take 1  capsule (200 mg total) by mouth 2 (two) times daily. For seizure disorder Patient not taking: Reported on 10/16/2016 09/19/15   Armandina Stammer I, NP  QUEtiapine (SEROQUEL) 100 MG tablet Take 100 mg by mouth 3 (three) times daily. 0800, 1200, 1700. 09/20/16   [provider]  QUEtiapine (SEROQUEL) 200 MG tablet Take 1 tablet (200 mg total) by mouth at bedtime. For mood control Patient not taking: Reported on 10/16/2016 09/19/15   Armandina Stammer I, NP  QUEtiapine (SEROQUEL) 25 MG tablet Take 1 tablet (25 mg total) by mouth 2 (two) times daily. For agitation Patient not taking: Reported on 10/16/2016 09/19/15   Armandina Stammer I, NP  sertraline (ZOLOFT) 100 MG tablet Take 100 mg by mouth daily. 09/20/16   [provider]    Allergies    Demerol and Morphine and related  Review of Systems   Review of Systems  Constitutional:  Negative for chills, diaphoresis and fever.  Respiratory:  Negative for shortness of breath.   Cardiovascular:  Negative for chest pain.  Gastrointestinal:  Positive for abdominal pain, diarrhea, nausea and vomiting.  Genitourinary:  Negative for dysuria.  Musculoskeletal:  Negative for arthralgias and myalgias.  Skin:  Negative for rash and wound.  Allergic/Immunologic: Negative for immunocompromised state.  Neurological:  Negative for weakness.  Hematological:  Negative for adenopathy.  Psychiatric/Behavioral:  Negative for confusion.   All other systems reviewed and are negative.  Physical Exam Updated Vital Signs BP 117/79   Pulse 61   Temp 98.4 F (36.9 C) (Oral)   Resp 18   SpO2 98%   Physical Exam Vitals and nursing note reviewed.  Constitutional:      General: She is not in acute distress.    Appearance: She is well-developed. She is not diaphoretic.  HENT:     Head: Normocephalic and atraumatic.  Cardiovascular:     Rate and Rhythm: Normal rate and regular rhythm.  Pulmonary:     Effort: Pulmonary effort is normal.     Breath sounds: Normal breath  sounds.  Abdominal:     Palpations: Abdomen is soft.     Tenderness: There is abdominal tenderness in the right upper quadrant and left upper quadrant.  Skin:    General: Skin is warm and dry.     Findings: No erythema or rash.  Neurological:     Mental Status: She is alert and oriented to person, place, and time.  Psychiatric:        Behavior: Behavior normal.    ED Results / Procedures / Treatments   Labs (all labs ordered are listed, but only abnormal results are displayed) Labs Reviewed  LIPASE, BLOOD  COMPREHENSIVE METABOLIC PANEL  CBC  URINALYSIS, ROUTINE W REFLEX MICROSCOPIC  ETHANOL  RAPID URINE DRUG SCREEN, HOSP PERFORMED    EKG None  Radiology No results found.  Procedures Procedures   Medications Ordered in ED Medications  sodium chloride 0.9 % bolus 1,000 mL (has no administration in time range)  ondansetron (ZOFRAN-ODT) disintegrating tablet 4 mg (4 mg Oral Given 10/11/20 1719)    ED Course  I have reviewed the triage vital signs and the nursing notes.  Pertinent labs & imaging results that were available during my care of the patient were reviewed by me and considered in my medical decision making (see chart for details).  Clinical Course as of 10/12/20 0009  Thu Oct 12, 2020  [id number] 45 year old female with abdominal pain with nausea vomiting and loose stools as above.  Plan was to assess lab work and consider imaging based on serial exams and lab results.  Patient is a difficult stick, IV team was contacted to obtain access.  Patient eloped, did not notify staff she was leaving. [LM]    Clinical Course User Index [LM] Alden Hipp   MDM Rules/Calculators/A&P                           Final Clinical Impression(s) / ED Diagnoses Final diagnoses:  Generalized abdominal pain  Nausea vomiting and diarrhea    Rx / DC Orders ED Discharge Orders     None        Jeannie Fend, PA-C 10/12/20 0009    Ernie Avena, MD 10/12/20  1407

## 2020-10-11 NOTE — ED Triage Notes (Signed)
Pt has been vomiting since 7/25. She reports also drinking alcohol for the first time since 2014 last night, reports having seizure while drinking half bottle vodka.

## 2020-10-11 NOTE — ED Notes (Signed)
Unable to draw labs at this time, patient stated that she is dehydrated and that she usually needs IV team or ultrasound to collect labs.

## 2020-10-11 NOTE — ED Notes (Signed)
Patient called for blood draw, no response.

## 2020-10-12 NOTE — ED Notes (Signed)
Writer left out of a pt's room to enter this patient's room to find pt's bp cuff on the floor and pulse ox reader on the floor.  Pt not in room or restroom.  Provider made aware.

## 2022-01-10 IMAGING — DX DG CHEST 1V PORT
1 series · 1 of 1 positions shown · non-contrast
Comparison: Overlapping portion of CT abdomen from 07/13/2020

CLINICAL DATA: Abdominal pain starting last night at 8 p.m.
Rhonchi.

EXAM:
PORTABLE CHEST 1 VIEW

[chest ap]
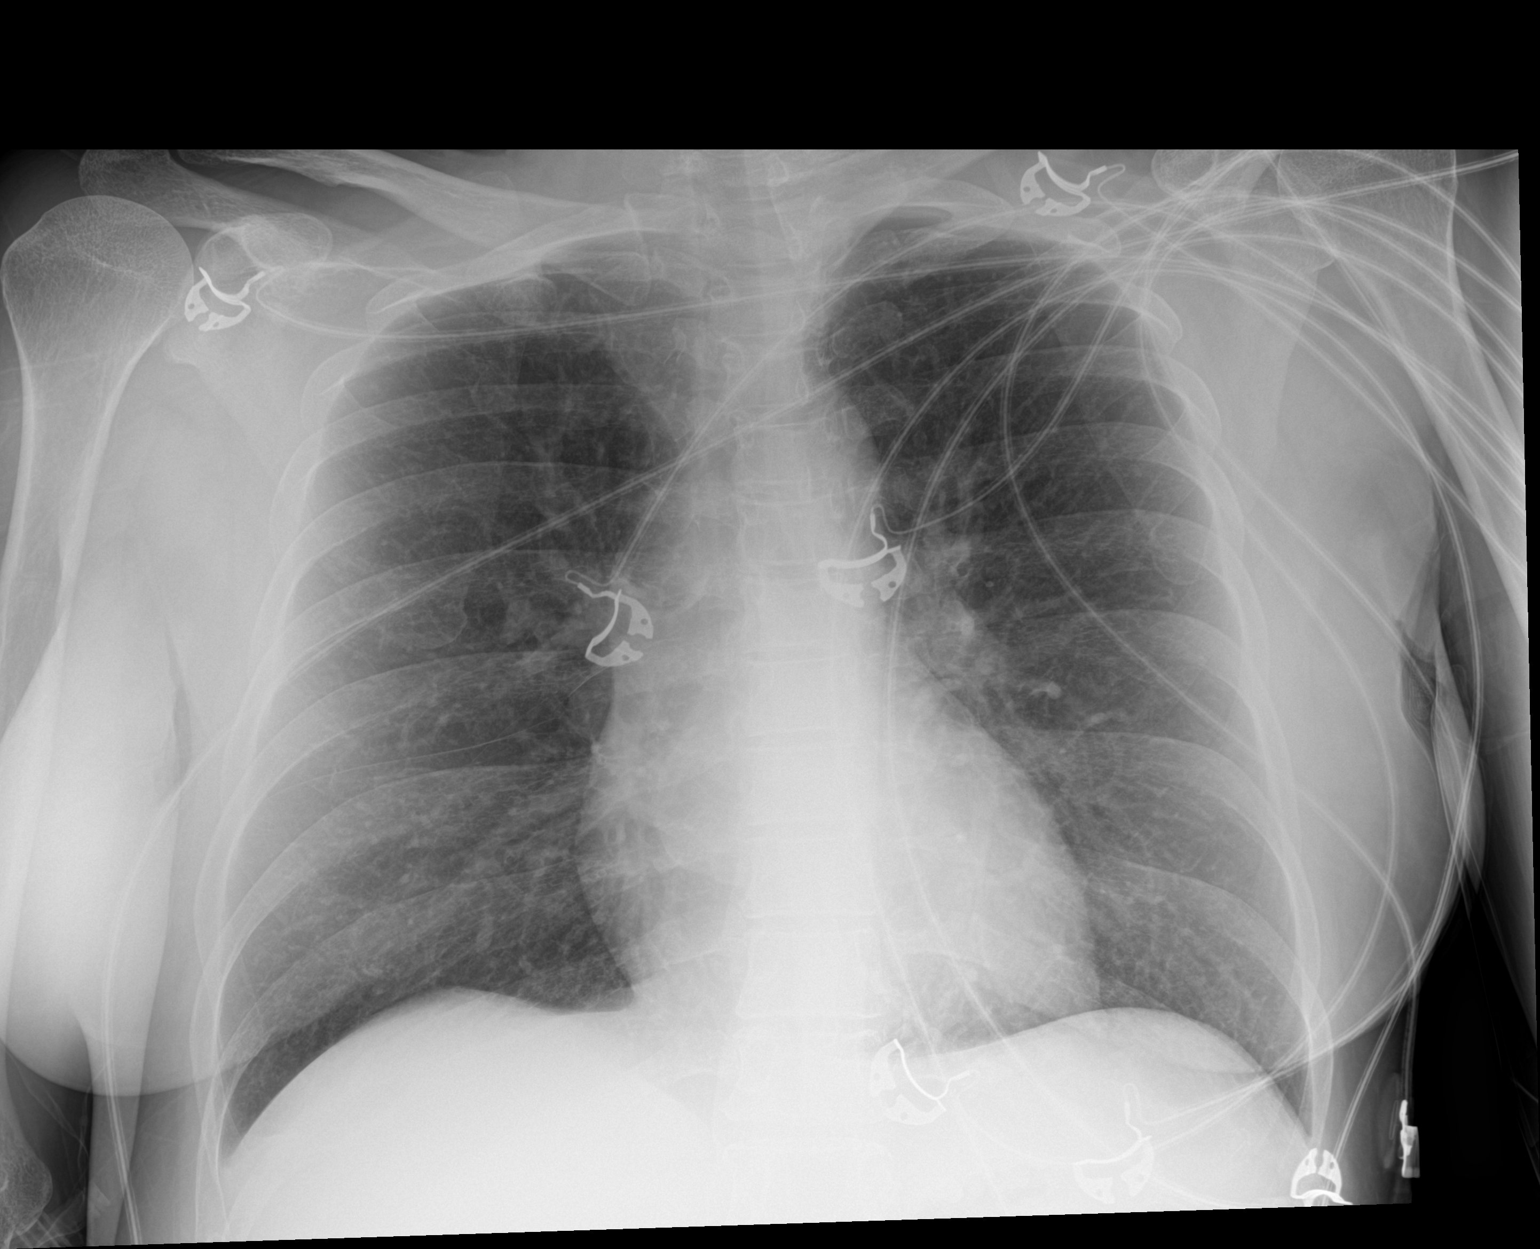

[1 of 1 positions shown; findings below may reference images not displayed]

FINDINGS: Airway thickening primarily in the lung bases, right greater than
left. Cardiac and mediastinal margins appear normal. No airspace
opacities are identified. No blunting of the costophrenic angles.
IMPRESSION: 1. Airway thickening in the lower lobes, right greater than left.
This may reflect bronchitis or reactive airways disease.

## 2023-02-15 ENCOUNTER — Emergency Department (HOSPITAL_COMMUNITY)
Admission: EM | Admit: 2023-02-15 | Discharge: 2023-02-15 | Disposition: A | Discharge status: Home / Self Care | Payer: 59 | Attending: Emergency Medicine | Admitting: Emergency Medicine

## 2023-02-15 ENCOUNTER — Emergency Department (HOSPITAL_COMMUNITY): Payer: 59

## 2023-02-15 ENCOUNTER — Other Ambulatory Visit: Payer: Self-pay

## 2023-02-15 DIAGNOSIS — F17210 Nicotine dependence, cigarettes, uncomplicated: Secondary | ICD-10-CM | POA: Insufficient documentation

## 2023-02-15 DIAGNOSIS — R220 Localized swelling, mass and lump, head: Secondary | ICD-10-CM | POA: Diagnosis present

## 2023-02-15 DIAGNOSIS — Z7982 Long term (current) use of aspirin: Secondary | ICD-10-CM | POA: Diagnosis not present

## 2023-02-15 DIAGNOSIS — L03211 Cellulitis of face: Secondary | ICD-10-CM | POA: Insufficient documentation

## 2023-02-15 DIAGNOSIS — S0081XA Abrasion of other part of head, initial encounter: Secondary | ICD-10-CM | POA: Insufficient documentation

## 2023-02-15 DIAGNOSIS — X58XXXA Exposure to other specified factors, initial encounter: Secondary | ICD-10-CM | POA: Diagnosis not present

## 2023-02-15 LAB — BASIC METABOLIC PANEL
Anion gap: 9 (ref 5–15)
BUN: 14 mg/dL (ref 6–20)
CO2: 25 mmol/L (ref 22–32)
Calcium: 9 mg/dL (ref 8.9–10.3)
Chloride: 107 mmol/L (ref 98–111)
Creatinine, Ser: 0.57 mg/dL (ref 0.44–1.00)
GFR, Estimated: >60 mL/min (ref >60)
Glucose, Bld: 83 mg/dL (ref 70–99)
Potassium: 4.1 mmol/L (ref 3.5–5.1)
Sodium: 141 mmol/L (ref 135–145)

## 2023-02-15 LAB — CBC
HCT: 41.1 % (ref 36.0–46.0)
Hemoglobin: 13.6 g/dL (ref 12.0–15.0)
MCH: 30.2 pg (ref 26.0–34.0)
MCHC: 33.1 g/dL (ref 30.0–36.0)
MCV: 91.3 fL (ref 80.0–100.0)
Platelets: 215 10*3/uL (ref 150–400)
RBC: 4.5 MIL/uL (ref 3.87–5.11)
RDW: 13.8 % (ref 11.5–15.5)
WBC: 10 10*3/uL (ref 4.0–10.5)
nRBC: 0 % (ref 0.0–0.2)

## 2023-02-15 LAB — POC URINE PREG, ED: Preg Test, Ur: NEGATIVE

## 2023-02-15 MED ORDER — KETOROLAC TROMETHAMINE 10 MG PO TABS: 10.0000 mg | ORAL_TABLET | Freq: Four times a day (QID) | ORAL | 0 refills | Status: AC | PRN

## 2023-02-15 MED ORDER — OXYCODONE-ACETAMINOPHEN 5-325 MG PO TABS
1.0000 | ORAL_TABLET | Freq: Once | ORAL | Status: AC
  Administered 2023-02-15: 1 via ORAL
  Filled 2023-02-15: qty 1

## 2023-02-15 MED ORDER — IOHEXOL 300 MG/ML  SOLN
75.0000 mL | Freq: Once | INTRAMUSCULAR | Status: AC | PRN
  Administered 2023-02-15: 75 mL via INTRAVENOUS

## 2023-02-15 MED ORDER — CLINDAMYCIN PHOSPHATE 300 MG/50ML IV SOLN
300.0000 mg | Freq: Once | INTRAVENOUS | Status: AC
  Administered 2023-02-15: 300 mg via INTRAVENOUS
  Filled 2023-02-15: qty 50

## 2023-02-15 MED ORDER — CLINDAMYCIN HCL 300 MG PO CAPS: 300.0000 mg | ORAL_CAPSULE | Freq: Three times a day (TID) | ORAL | 0 refills | Status: AC

## 2023-02-15 NOTE — ED Notes (Signed)
EDP at bedside during triage 

## 2023-02-15 NOTE — ED Provider Notes (Signed)
[place] EMERGENCY DEPARTMENT AT [facility] Provider Note   CSN: [id number] Arrival date & time: 02/15/23  3474     History  Chief Complaint  Patient presents with   Facial Swelling    Katherine Bond is a 47 y.o. female.  HPI   47 year old female presents emergency department with complaints of right-sided facial swelling.  Patient states that she has been noticing symptoms for the past 2 days.  States that she administratively thought it was a dental infection despite not having dental pain and took at home doxycycline without significant improvement.  Reports mild swelling beneath her right jaw.  States that an area of scab appeared on the front of her face but this occurred after the swelling had occurred.  Denies any difficulty breathing/swallowing, tongue swelling, fever, chills.  Denies history of similar symptoms in the past.  Past medical history significant for bipolar 1 disorder, depression, epilepsy, pancreatitis, alcohol abuse, PTSD  Home Medications Prior to Admission medications   Medication Sig Start Date End Date Taking? Authorizing Provider  clindamycin (CLEOCIN) 300 MG capsule Take 1 capsule (300 mg total) by mouth 3 (three) times daily for 7 days. 02/15/23 02/22/23 Yes Sherian Maroon A, PA  ketorolac (TORADOL) 10 MG tablet Take 1 tablet (10 mg total) by mouth every 6 (six) hours as needed. 02/15/23  Yes Sherian Maroon A, PA  Aspirin-Acetaminophen-Caffeine (GOODY HEADACHE PO) Take 1 packet by mouth daily as needed (headache).     [provider]  busPIRone (BUSPAR) 10 MG tablet Take 10 mg by mouth 2 (two) times daily. 09/20/16   [provider]  dicyclomine (BENTYL) 20 MG tablet Take 1 tablet (20 mg total) by mouth 2 (two) times daily as needed for spasms (abdominal pain). 10/16/16   Alvira Monday, MD  DILANTIN 100 MG ER capsule Take 200 mg by mouth at bedtime. 09/20/16   [provider]  gabapentin (NEURONTIN) 300 MG capsule Take  2 capsules (600 mg total) by mouth 3 (three) times daily with meals. For agitation 10/16/16 10/23/16  Alvira Monday, MD  hydrOXYzine (VISTARIL) 50 MG capsule Take 50 mg by mouth 3 (three) times daily as needed. 09/20/16   [provider]  Multiple Vitamins-Minerals (ADULT GUMMY) CHEW Chew 2 Units by mouth daily.    [provider]  ondansetron (ZOFRAN ODT) 4 MG disintegrating tablet Take 1 tablet (4 mg total) by mouth every 8 (eight) hours as needed for nausea or vomiting. 07/13/20   Sponseller, Lupe Carney R, PA-C  pantoprazole (PROTONIX) 40 MG tablet Take 1 tablet (40 mg total) by mouth daily. 07/13/20 08/12/20  Sponseller, Lupe Carney R, PA-C  QUEtiapine (SEROQUEL) 100 MG tablet Take 100 mg by mouth 3 (three) times daily. 0800, 1200, 1700. 09/20/16   [provider]  sertraline (ZOLOFT) 100 MG tablet Take 100 mg by mouth daily. 09/20/16   [provider]      Allergies    Demerol and Morphine and codeine    Review of Systems   Review of Systems  All other systems reviewed and are negative.   Physical Exam Updated Vital Signs BP 131/85   Pulse (!) 51   Temp 98.5 F (36.9 C) (Oral)   Resp 18   Ht 5\' 4"  (1.626 m)   Wt 63.5 kg   SpO2 96%   BMI 24.03 kg/m  Physical Exam Vitals and nursing note reviewed.  Constitutional:      General: She is not in acute distress.    Appearance:  She is well-developed.  HENT:     Head: Normocephalic and atraumatic.     Comments: Patient with swelling right side of face as well as appreciable right-sided submandibular swelling.  Area tender to palpation without obvious erythema.  Superficial abrasion appreciated right anterior chin.  Patient with poor dentition with multiple missing teeth as well as dental caries present.  No obvious gingival tenderness or tenderness when palpating gumline.  No obvious periapical abscess.  Uvula midline right symmetric with phonation.  No obvious sublingual swelling. Eyes:     Conjunctiva/sclera:  Conjunctivae normal.  Cardiovascular:     Rate and Rhythm: Normal rate and regular rhythm.     Heart sounds: No murmur heard. Pulmonary:     Effort: Pulmonary effort is normal. No respiratory distress.     Breath sounds: Normal breath sounds.  Abdominal:     Palpations: Abdomen is soft.     Tenderness: There is no abdominal tenderness.  Musculoskeletal:        General: No swelling.     Cervical back: Neck supple.  Skin:    General: Skin is warm and dry.     Capillary Refill: Capillary refill takes less than 2 seconds.  Neurological:     Mental Status: She is alert.  Psychiatric:        Mood and Affect: Mood normal.     ED Results / Procedures / Treatments   Labs (all labs ordered are listed, but only abnormal results are displayed) Labs Reviewed  BASIC METABOLIC PANEL  CBC  POC URINE PREG, ED    EKG None  Radiology CT Maxillofacial W Contrast  Result Date: 02/15/2023 CLINICAL DATA:  Right facial swelling and pain. EXAM: CT MAXILLOFACIAL WITH CONTRAST TECHNIQUE: Multidetector CT imaging of the maxillofacial structures was performed with intravenous contrast. Multiplanar CT image reconstructions were also generated. RADIATION DOSE REDUCTION: This exam was performed according to the departmental dose-optimization program which includes automated exposure control, adjustment of the mA and/or kV according to patient size and/or use of iterative reconstruction technique. CONTRAST:  75mL OMNIPAQUE IOHEXOL 300 MG/ML  SOLN COMPARISON:  None Available. FINDINGS: Osseous: No acute fractures are present. Minimal periapical lucency is present about the roots of the right mandibular canine tooth. Small dental caries is associated with the same tooth. Lateral cortical disruption is present at this level. No focal osseous lesions are present. Orbits: The globes and orbits are within normal limits. Sinuses: A polyp or mucous retention cyst is present in the right maxillary sinus. The paranasal  sinuses and mastoid air cells are otherwise clear. Soft tissues: Extensive soft tissue swelling is present in the buccal mucosa and mylohyoid adjacent to the right side of the mandible near the dental disease described above. No discrete subperiosteal abscess is present. Stranding extends into the subcutaneous soft tissues. No discrete abscess is present. Limited intracranial: Within normal limits. IMPRESSION: 1. Extensive soft tissue swelling in the buccal mucosa and mylohyoid adjacent to the right side of the mandible near the dental disease described above. Findings are compatible with cellulitis. 2. No discrete subperiosteal abscess is present. 3. Small dental caries is associated with the same tooth. 4. Polyp or mucous retention cyst in the right maxillary sinus. Electronically Signed   By: Marin Roberts M.D.   On: 02/15/2023 11:52    Procedures Procedures    Medications Ordered in ED Medications  oxyCODONE-acetaminophen (PERCOCET/ROXICET) 5-325 MG per tablet 1 tablet (1 tablet Oral Given 02/15/23 1036)  clindamycin (CLEOCIN) IVPB 300 mg (  0 mg Intravenous Stopped 02/15/23 1207)  iohexol (OMNIPAQUE) 300 MG/ML solution 75 mL (75 mLs Intravenous Contrast Given 02/15/23 1110)    ED Course/ Medical Decision Making/ A&P                                 Medical Decision Making Amount and/or Complexity of Data Reviewed Labs: ordered. Radiology: ordered.  Risk Prescription drug management.   This patient presents to the ED for concern of facial swelling, this involves an extensive number of treatment options, and is a complaint that carries with it a high risk of complications and morbidity.  The differential diagnosis includes cellulitis, abscess, Ludwig angina, sialoadenitis, other   Co morbidities that complicate the patient evaluation  See HPI   Additional history obtained:  Additional history obtained from EMR External records from outside source obtained and reviewed  including hospital records   Lab Tests:  I Ordered, and personally interpreted labs.  The pertinent results include: No leukocytosis.  No evidence of anemia.  Platelets within range.  No Electra abnormalities.  No renal dysfunction.  Urine pregnancy negative.   Imaging Studies ordered:  I ordered imaging studies including CT maxillofacial I independently visualized and interpreted imaging which showed soft tissue swelling right buccal mucosa concerning for cellulitis.  No obvious abscess.  Dental caries.  Polyp or mucous retention cyst in right maxillary sinus I agree with the radiologist interpretation  Cardiac Monitoring: / EKG:  The patient was maintained on a cardiac monitor.  I personally viewed and interpreted the cardiac monitored which showed an underlying rhythm of: Sinus rhythm   Consultations Obtained:  N/a   Problem List / ED Course / Critical interventions / Medication management  Cellulitis I ordered medication including icing, Percocet  Reevaluation of the patient after these medicines showed that the patient improved I have reviewed the patients home medicines and have made adjustments as needed   Social Determinants of Health:  Cigarette use.  Denies illicit drug use.   Test / Admission - Considered:  Cellulitis Vitals signs within normal range and stable throughout visit. Laboratory/imaging studies significant for: See above 47 year old female presents emergency department complaints of right-sided facial swelling for the past 2 days.  Patient with known fractured teeth, missing teeth and multiple dental caries but denies any dental pain.  On exam, patient with swelling most so right lateral mandible with possible right sided submandibular swelling.  Labs reassuring.  CT imaging concerning for right-sided cellulitis of face without evidence of Ludwig angina.  Treated with medications while in the ED and did note improvement.  Will send patient home with  antibiotics.  Will recommend follow-up with dentistry in the outpatient setting for addressing affected teeth.  Strict return precautions discussed at length.  Treatment plan discussed at length with patient and she acknowledged understanding was agreeable to said plan.  Patient overall well-appearing, afebrile in no acute distress. Worrisome signs and symptoms were discussed with the patient, and the patient acknowledged understanding to return to the ED if noticed. Patient was stable upon discharge.          Final Clinical Impression(s) / ED Diagnoses Final diagnoses:  Cellulitis of face    Rx / DC Orders ED Discharge Orders          Ordered    clindamycin (CLEOCIN) 300 MG capsule  3 times daily        02/15/23 1201  ketorolac (TORADOL) 10 MG tablet  Every 6 hours PRN        02/15/23 1224              Peter Garter, [place] 02/15/23 1245    Pricilla Loveless, MD 02/15/23 1308

## 2023-02-15 NOTE — ED Triage Notes (Signed)
Pt c/o right lower jaw pain and swelling. Area is hard, painful and has a abrasion to area since Thurs.

## 2023-02-15 NOTE — Discharge Instructions (Signed)
As discussed, CT imaging with concerns for cellulitis.  Will place you on antibiotics for treatment of this.  Please return if you develop worsening swelling, difficulty breathing/swallowing, fever, worsening redness.  Will also send you home with a medication called Toradol to take for pain.  This medication is in the same family as ibuprofen/Aleve so please do not take these medicines at the same time.  Please do not hesitate to return if the worrisome signs and symptoms we discussed become apparent.
